# Patient Record
Sex: Female | Born: 1985 | Race: White | Hispanic: No | Marital: Single | Smoking: Current every day smoker
Health system: Southern US, Community
[De-identification: ages and names within clinical notes are randomized; demographics above are authoritative.]

## PROBLEM LIST (undated history)

## (undated) ENCOUNTER — Inpatient Hospital Stay (HOSPITAL_COMMUNITY): Payer: Self-pay

## (undated) DIAGNOSIS — D649 Anemia, unspecified: Secondary | ICD-10-CM

## (undated) DIAGNOSIS — B009 Herpesviral infection, unspecified: Secondary | ICD-10-CM

## (undated) HISTORY — PX: NO PAST SURGERIES: SHX2092

---
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
## 2010-11-05 ENCOUNTER — Other Ambulatory Visit: Payer: Self-pay | Admitting: Family Medicine

## 2010-11-05 DIAGNOSIS — R609 Edema, unspecified: Secondary | ICD-10-CM

## 2010-11-05 DIAGNOSIS — M79606 Pain in leg, unspecified: Secondary | ICD-10-CM

## 2010-11-10 ENCOUNTER — Ambulatory Visit
Admission: RE | Admit: 2010-11-10 | Discharge: 2010-11-10 | Disposition: A | Discharge status: Home / Self Care | Payer: Medicaid Other | Source: Ambulatory Visit | Attending: Family Medicine | Admitting: Family Medicine

## 2010-11-10 DIAGNOSIS — M79606 Pain in leg, unspecified: Secondary | ICD-10-CM

## 2010-11-10 DIAGNOSIS — R609 Edema, unspecified: Secondary | ICD-10-CM

## 2015-07-20 LAB — OB RESULTS CONSOLE GC/CHLAMYDIA
Chlamydia: NEGATIVE
Gonorrhea: NEGATIVE

## 2015-07-20 LAB — OB RESULTS CONSOLE GBS: GBS: NEGATIVE

## 2015-07-20 LAB — OB RESULTS CONSOLE RPR: RPR: NONREACTIVE

## 2015-07-20 LAB — OB RESULTS CONSOLE HIV ANTIBODY (ROUTINE TESTING): HIV: NONREACTIVE

## 2015-07-20 LAB — OB RESULTS CONSOLE RUBELLA ANTIBODY, IGM: RUBELLA: IMMUNE

## 2015-07-20 LAB — OB RESULTS CONSOLE HEPATITIS B SURFACE ANTIGEN: HEP B S AG: NEGATIVE

## 2015-07-20 LAB — OB RESULTS CONSOLE ANTIBODY SCREEN: ANTIBODY SCREEN: NEGATIVE

## 2015-09-06 NOTE — L&D Delivery Note (Signed)
Delivery Note At 4:38 AM a viable female "Lindsey Prince" was delivered via Vaginal, Spontaneous Delivery (Presentation: Occiput Anterior). APGARS: ; weight  .   Placenta status: Intact, Spontaneous Duncan. Cord: 3 vessels with the following complications: None.  Cord pH: NA  Anesthesia: Epidural  Episiotomy: None Lacerations: 1st degree (Inner labias & vaginal --- L inner labia hemostatic and required no repair. L inner labia and vagina repaired w/ 1 interrupted each) Suture Repair: 3.0 vicryl CT-1 Est. Blood Loss (mL): 100  Mom to postpartum.  Baby to Couplet care / Skin to Skin.  Mom undecided about birth control.   Lindsey Prince, Lindsey Prince 02/21/2016, 5:13 AM

## 2015-10-09 DIAGNOSIS — O4400 Placenta previa specified as without hemorrhage, unspecified trimester: Secondary | ICD-10-CM | POA: Insufficient documentation

## 2015-12-28 ENCOUNTER — Inpatient Hospital Stay (HOSPITAL_COMMUNITY)
Admission: AD | Admit: 2015-12-28 | Discharge: 2015-12-28 | Disposition: A | Discharge status: Home / Self Care | Payer: Medicaid Other | Source: Ambulatory Visit | Attending: Obstetrics and Gynecology | Admitting: Obstetrics and Gynecology

## 2015-12-28 ENCOUNTER — Encounter (HOSPITAL_COMMUNITY): Payer: Self-pay | Admitting: *Deleted

## 2015-12-28 DIAGNOSIS — Z3A31 31 weeks gestation of pregnancy: Secondary | ICD-10-CM | POA: Diagnosis not present

## 2015-12-28 DIAGNOSIS — IMO0001 Reserved for inherently not codable concepts without codable children: Secondary | ICD-10-CM

## 2015-12-28 DIAGNOSIS — O4703 False labor before 37 completed weeks of gestation, third trimester: Secondary | ICD-10-CM

## 2015-12-28 HISTORY — DX: Anemia, unspecified: D64.9

## 2015-12-28 LAB — WET PREP, GENITAL
Clue Cells Wet Prep HPF POC: NONE SEEN
Sperm: NONE SEEN
Trich, Wet Prep: NONE SEEN
WBC, Wet Prep HPF POC: NONE SEEN
Yeast Wet Prep HPF POC: NONE SEEN

## 2015-12-28 LAB — URINE MICROSCOPIC-ADD ON: Bacteria, UA: NONE SEEN

## 2015-12-28 LAB — URINALYSIS, ROUTINE W REFLEX MICROSCOPIC
Bilirubin Urine: NEGATIVE
Glucose, UA: NEGATIVE mg/dL
Ketones, ur: NEGATIVE mg/dL
Leukocytes, UA: NEGATIVE
Nitrite: NEGATIVE
Protein, ur: NEGATIVE mg/dL
Specific Gravity, Urine: 1.02 (ref 1.005–1.030)
pH: 6 (ref 5.0–8.0)

## 2015-12-28 LAB — FETAL FIBRONECTIN: Fetal Fibronectin: NEGATIVE

## 2015-12-28 MED ORDER — NIFEDIPINE 10 MG PO CAPS: 10.0000 mg | ORAL_CAPSULE | Freq: Four times a day (QID) | ORAL | Status: DC | PRN

## 2015-12-28 MED ORDER — LACTATED RINGERS IV BOLUS (SEPSIS)
1000.0000 mL | Freq: Once | INTRAVENOUS | Status: AC
  Administered 2015-12-28: 1000 mL via INTRAVENOUS

## 2015-12-28 MED ORDER — NIFEDIPINE 10 MG PO CAPS
10.0000 mg | ORAL_CAPSULE | ORAL | Status: DC | PRN
  Administered 2015-12-28 (×2): 10 mg via ORAL
  Filled 2015-12-28 (×2): qty 1

## 2015-12-28 MED ORDER — BUTORPHANOL TARTRATE 1 MG/ML IJ SOLN
2.0000 mg | Freq: Once | INTRAMUSCULAR | Status: AC
  Administered 2015-12-28: 2 mg via INTRAVENOUS
  Filled 2015-12-28: qty 2

## 2015-12-28 NOTE — MAU Note (Addendum)
Pt states had braxton hicks contractions around 1000.  Then sharp pain shifted to her lower left back and now her lower left abdomen.  Denies vaginal bleeding or LOF.Pt states also having pelvic pressure.

## 2015-12-28 NOTE — MAU Provider Note (Signed)
Lindsey Prince is a 30y.o. G2P1001 at 31.1wks who presented earlier for contractions.  Per provider, patient ctx started this afternoon and radiates into her back.  Also reports DFM.  Addendum (2022) S:  Patient reports continued contractions despite IV fluids and stadol.   O:  Filed Vitals:   12/28/15 1825 12/28/15 1946  BP: 125/77 119/72  Pulse: 76 73  Temp: 97.5 F (36.4 C)   Resp: 20 18   Results for orders placed or performed during the hospital encounter of 12/28/15 (from the past 24 hour(s))  Urinalysis, Routine w reflex microscopic ([facility])     Status: Abnormal   Collection Time: 12/28/15  6:10 PM  Result Value Ref Range   Color, Urine YELLOW YELLOW   APPearance CLEAR CLEAR   Specific Gravity, Urine 1.020 1.005 - 1.030   pH 6.0 5.0 - 8.0   Glucose, UA NEGATIVE NEGATIVE mg/dL   Hgb urine dipstick MODERATE (A) NEGATIVE   Bilirubin Urine NEGATIVE NEGATIVE   Ketones, ur NEGATIVE NEGATIVE mg/dL   Protein, ur NEGATIVE NEGATIVE mg/dL   Nitrite NEGATIVE NEGATIVE   Leukocytes, UA NEGATIVE NEGATIVE  Urine microscopic-add on     Status: Abnormal   Collection Time: 12/28/15  6:10 PM  Result Value Ref Range   Squamous Epithelial / LPF 6-30 (A) NONE SEEN   WBC, UA 0-5 0 - 5 WBC/hpf   RBC / HPF 6-30 0 - 5 RBC/hpf   Bacteria, UA NONE SEEN NONE SEEN   Urine-Other AMORPHOUS URATES/PHOSPHATES   Fetal fibronectin     Status: None   Collection Time: 12/28/15  6:33 PM  Result Value Ref Range   Fetal Fibronectin NEGATIVE NEGATIVE   VE: Cl/TH/Ballotable, Firm, Posterior  A: IUP at 31.1wks Cat I FT Contractions  P: Wet Prep obtained Discussed procardia regime per [facility] protocol Give first dose now and reassess  Addendum (2134) -Patient reports decrease in contractions -Incident of projectile vomiting, but no current nausea or feelings of ill -Discussed procardia usage  -Rx for procardia sent to pharmacy: 10mg  Q6hrs prn, Disp 30, RF 0 -Keep appt as scheduled: May  2 -Encouraged to call if any questions or concerns arise prior to next scheduled office visit.  -Discharged to home in improved condition  Lindsey RobinsJessica L Leroy Pettway MSN, CNM 12/28/2015 9:42 PM

## 2015-12-28 NOTE — MAU Provider Note (Signed)
Lindsey Prince is a 30 y.o. G2P1001 at 31.1 weeks presented to MAU unannounced c/o ctx that started this afternoon that moved to her left flank. She denies vb or lof.  She report a decrease in fetal  Movement when the pain moved to left flank   History     There are no active problems to display for this patient.   Chief Complaint  Patient presents with  . Abdominal Pain   HPI  OB History    Gravida Para Term Preterm AB TAB SAB Ectopic Multiple Living   2 1 1       1       Past Medical History  Diagnosis Date  . Anemia     Past Surgical History  Procedure Laterality Date  . No past surgeries      History reviewed. No pertinent family history.  Social History  Substance Use Topics  . Smoking status: Former Smoker    Types: Cigarettes    Quit date: 12/28/2014  . Smokeless tobacco: Never Used  . Alcohol Use: No    Allergies: Not on File  No prescriptions prior to admission    ROS See HPI above, all other systems are negative  Physical Exam   Blood pressure 125/77, pulse 76, temperature 97.5 F (36.4 C), temperature source Oral, resp. rate 20, last menstrual period 05/24/2015.  Physical Exam Ext:  WNL ABD: Soft, non tender to palpation, no rebound or guarding SVE: C/T/H   ED Course  Assessment: IUP at  31.1weeks Membranes: intact FHR: Category 1 CTX:  3 minutes   Plan: Labs: FFN 2 hr observation IVF stadol   Zelpha Messing, CNM, MSN 12/28/2015. 6:35 PM

## 2015-12-28 NOTE — Discharge Instructions (Signed)
Braxton Hicks Contractions °Contractions of the uterus can occur throughout pregnancy. Contractions are not always a sign that you are in labor.  °WHAT ARE BRAXTON HICKS CONTRACTIONS?  °Contractions that occur before labor are called Braxton Hicks contractions, or false labor. Toward the end of pregnancy (32-34 weeks), these contractions can develop more often and may become more forceful. This is not true labor because these contractions do not result in opening (dilatation) and thinning of the cervix. They are sometimes difficult to tell apart from true labor because these contractions can be forceful and people have different pain tolerances. You should not feel embarrassed if you go to the hospital with false labor. Sometimes, the only way to tell if you are in true labor is for your health care provider to look for changes in the cervix. °If there are no prenatal problems or other health problems associated with the pregnancy, it is completely safe to be sent home with false labor and await the onset of true labor. °HOW CAN YOU TELL THE DIFFERENCE BETWEEN TRUE AND FALSE LABOR? °False Labor °· The contractions of false labor are usually shorter and not as hard as those of true labor.   °· The contractions are usually irregular.   °· The contractions are often felt in the front of the lower abdomen and in the groin.   °· The contractions may go away when you walk around or change positions while lying down.   °· The contractions get weaker and are shorter lasting as time goes on.   °· The contractions do not usually become progressively stronger, regular, and closer together as with true labor.   °True Labor °· Contractions in true labor last 30-70 seconds, become very regular, usually become more intense, and increase in frequency.   °· The contractions do not go away with walking.   °· The discomfort is usually felt in the top of the uterus and spreads to the lower abdomen and low back.   °· True labor can be  determined by your health care provider with an exam. This will show that the cervix is dilating and getting thinner.   °WHAT TO REMEMBER °· Keep up with your usual exercises and follow other instructions given by your health care provider.   °· Take medicines as directed by your health care provider.   °· Keep your regular prenatal appointments.   °· Eat and drink lightly if you think you are going into labor.   °· If Braxton Hicks contractions are making you uncomfortable:   °¨ Change your position from lying down or resting to walking, or from walking to resting.   °¨ Sit and rest in a tub of warm water.   °¨ Drink 2-3 glasses of water. Dehydration may cause these contractions.   °¨ Do slow and deep breathing several times an hour.   °WHEN SHOULD I SEEK IMMEDIATE MEDICAL CARE? °Seek immediate medical care if: °· Your contractions become stronger, more regular, and closer together.   °· You have fluid leaking or gushing from your vagina.   °· You have a fever.   °· You pass blood-tinged mucus.   °· You have vaginal bleeding.   °· You have continuous abdominal pain.   °· You have low back pain that you never had before.   °· You feel your baby's head pushing down and causing pelvic pressure.   °· Your baby is not moving as much as it used to.   °  °This information is not intended to replace advice given to you by your health care provider. Make sure you discuss any questions you have with your health care   provider. °  °Document Released: 08/22/2005 Document Revised: 08/27/2013 Document Reviewed: 06/03/2013 °Elsevier Interactive Patient Education ©2016 [employer] ° °

## 2015-12-30 LAB — CULTURE, OB URINE: SPECIAL REQUESTS: NORMAL

## 2016-02-20 ENCOUNTER — Inpatient Hospital Stay (HOSPITAL_COMMUNITY)
Admit: 2016-02-20 | Discharge: 2016-02-22 | DRG: 774 | Disposition: A | Discharge status: Home / Self Care | Payer: Medicaid Other | Source: Ambulatory Visit | Attending: Obstetrics & Gynecology | Admitting: Obstetrics & Gynecology

## 2016-02-20 ENCOUNTER — Encounter (HOSPITAL_COMMUNITY): Payer: Self-pay | Admitting: *Deleted

## 2016-02-20 DIAGNOSIS — O4202 Full-term premature rupture of membranes, onset of labor within 24 hours of rupture: Principal | ICD-10-CM | POA: Diagnosis present

## 2016-02-20 DIAGNOSIS — F17210 Nicotine dependence, cigarettes, uncomplicated: Secondary | ICD-10-CM | POA: Diagnosis present

## 2016-02-20 DIAGNOSIS — Z6791 Unspecified blood type, Rh negative: Secondary | ICD-10-CM

## 2016-02-20 DIAGNOSIS — O99334 Smoking (tobacco) complicating childbirth: Secondary | ICD-10-CM | POA: Diagnosis present

## 2016-02-20 DIAGNOSIS — Z3490 Encounter for supervision of normal pregnancy, unspecified, unspecified trimester: Secondary | ICD-10-CM

## 2016-02-20 DIAGNOSIS — O26893 Other specified pregnancy related conditions, third trimester: Secondary | ICD-10-CM | POA: Diagnosis present

## 2016-02-20 DIAGNOSIS — A6000 Herpesviral infection of urogenital system, unspecified: Secondary | ICD-10-CM | POA: Diagnosis present

## 2016-02-20 DIAGNOSIS — Z3A38 38 weeks gestation of pregnancy: Secondary | ICD-10-CM | POA: Diagnosis not present

## 2016-02-20 DIAGNOSIS — O9832 Other infections with a predominantly sexual mode of transmission complicating childbirth: Secondary | ICD-10-CM | POA: Diagnosis present

## 2016-02-20 HISTORY — DX: Herpesviral infection, unspecified: B00.9

## 2016-02-20 LAB — CBC
HEMATOCRIT: 31.5 % — AB (ref 36.0–46.0)
HEMOGLOBIN: 11.3 g/dL — AB (ref 12.0–15.0)
MCH: 33.6 pg (ref 26.0–34.0)
MCHC: 35.9 g/dL (ref 30.0–36.0)
MCV: 93.8 fL (ref 78.0–100.0)
Platelets: 410 10*3/uL — ABNORMAL HIGH (ref 150–400)
RBC: 3.36 MIL/uL — AB (ref 3.87–5.11)
RDW: 13.3 % (ref 11.5–15.5)
WBC: 15.9 10*3/uL — ABNORMAL HIGH (ref 4.0–10.5)

## 2016-02-20 LAB — POCT FERN TEST: POCT Fern Test: POSITIVE

## 2016-02-20 MED ORDER — LIDOCAINE HCL (PF) 1 % IJ SOLN
30.0000 mL | INTRAMUSCULAR | Status: DC | PRN
  Filled 2016-02-20: qty 30

## 2016-02-20 MED ORDER — OXYTOCIN 40 UNITS IN LACTATED RINGERS INFUSION - SIMPLE MED: 2.5000 [IU]/h | INTRAVENOUS | Status: DC

## 2016-02-20 MED ORDER — OXYTOCIN 40 UNITS IN LACTATED RINGERS INFUSION - SIMPLE MED
1.0000 m[IU]/min | INTRAVENOUS | Status: DC
  Administered 2016-02-20: 2 m[IU]/min via INTRAVENOUS
  Filled 2016-02-20: qty 1000

## 2016-02-20 MED ORDER — OXYCODONE-ACETAMINOPHEN 5-325 MG PO TABS: 2.0000 | ORAL_TABLET | ORAL | Status: DC | PRN

## 2016-02-20 MED ORDER — SOD CITRATE-CITRIC ACID 500-334 MG/5ML PO SOLN: 30.0000 mL | ORAL | Status: DC | PRN

## 2016-02-20 MED ORDER — TERBUTALINE SULFATE 1 MG/ML IJ SOLN
0.2500 mg | Freq: Once | INTRAMUSCULAR | Status: DC | PRN
  Filled 2016-02-20: qty 1

## 2016-02-20 MED ORDER — OXYCODONE-ACETAMINOPHEN 5-325 MG PO TABS: 1.0000 | ORAL_TABLET | ORAL | Status: DC | PRN

## 2016-02-20 MED ORDER — LACTATED RINGERS IV SOLN: 500.0000 mL | INTRAVENOUS | Status: DC | PRN

## 2016-02-20 MED ORDER — BUTORPHANOL TARTRATE 1 MG/ML IJ SOLN
1.0000 mg | INTRAMUSCULAR | Status: DC | PRN
  Administered 2016-02-20 (×2): 1 mg via INTRAVENOUS
  Filled 2016-02-20 (×2): qty 1

## 2016-02-20 MED ORDER — LACTATED RINGERS IV SOLN
INTRAVENOUS | Status: DC
  Administered 2016-02-20 – 2016-02-21 (×3): via INTRAVENOUS

## 2016-02-20 MED ORDER — OXYTOCIN 40 UNITS IN LACTATED RINGERS INFUSION - SIMPLE MED: 1.0000 m[IU]/min | INTRAVENOUS | Status: DC

## 2016-02-20 MED ORDER — ACETAMINOPHEN 325 MG PO TABS: 650.0000 mg | ORAL_TABLET | ORAL | Status: DC | PRN

## 2016-02-20 MED ORDER — ONDANSETRON HCL 4 MG/2ML IJ SOLN: 4.0000 mg | Freq: Four times a day (QID) | INTRAMUSCULAR | Status: DC | PRN

## 2016-02-20 MED ORDER — OXYTOCIN BOLUS FROM INFUSION
500.0000 mL | INTRAVENOUS | Status: DC
  Administered 2016-02-21: 500 mL via INTRAVENOUS

## 2016-02-20 NOTE — MAU Note (Signed)
Water broke around 5am,  Contractions started shortly there after

## 2016-02-20 NOTE — Anesthesia Pain Management Evaluation Note (Signed)
  CRNA Pain Management Visit Note  Patient: Lindsey Prince, 30 y.o., female  "Hello I am a [facility]. We have an anesthesia team available at all times to provide care throughout the hospital, including epidural management and anesthesia for C-section. I don't know your plan for the delivery whether it a natural birth, water birth, IV sedation, nitrous supplementation, doula or epidural, but we want to meet your pain goals."   1.Was your pain managed to your expectations on prior hospitalizations?   yes  2.What is your expectation for pain management during this hospitalization?     Natural possibly IVP meds  3.How can we help you reach that goal? No epidural   Record the patient's initial score and the patient's pain goal.   Pain:0  Pain Goal: 7  [facility] wants you to be able to say your pain was always managed very well.  [facility] Hospital[facility],Shriya Aker 02/20/2016

## 2016-02-20 NOTE — Progress Notes (Addendum)
[facility], 30 yo G2P1001 @ 38.6 wks admitted for PROM. Family at bedside.  Subjective: Ctxs becoming more uncomfortable. Continues to leak clear fluid. +FM.  Objective: BP 120/79 mmHg  Pulse 72  Temp(Src) 98 F (36.7 C) (Oral)  Resp 18  Ht 5\' 2"  (1.575 m)  Wt 71.215 kg (157 lb)  BMI 28.71 kg/m2  LMP 05/24/2015 I/O last 3 completed shifts: In: 120 [P.O.:120] Out: -     FHT: BL 140 w/ moderate variability, +accels, earlys, no decels UC:   irregular, every 1-2 minutes SVE:   Dilation: 1 Effacement (%): 60 Station: -3 Exam by:: Lindsey Prince, CNM@ 2229 Pitocin at 8 mU/min IUPC placed w/o difficulty @ 2229  Assessment:  IUP at term ROM x 17 hrs; no s/s of infection GBS neg  Plan: Continue Pitocin IV pain med/epidural prn  Sherre ScarletWILLIAMS, Julann Mcgilvray CNM 02/20/2016, 10:36 PM

## 2016-02-20 NOTE — MAU Note (Addendum)
Water broke at 0530, clear. Having some contractions. Decreased FM this AM.

## 2016-02-20 NOTE — H&P (Signed)
Lindsey Prince is a 30 y.o. female, G2P1001 at 1847w6d weeks, presenting for rupture of membranes around 530 this am.  Having some contractions, but states they are not very strong.  No vaginal bleeding, +FM.  Prenatal care with [facility].  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 Anemia    Past Surgical History  Procedure Laterality Date  . No past surgeries     Family History: family history is not on file. Social History:  reports that she has been smoking Cigarettes.  She has been smoking about 0.25 packs per day. She has never used smokeless tobacco. She reports that she does not drink alcohol or use illicit drugs.   Prenatal Transfer Tool  Maternal Diabetes: No Genetic Screening: Normal Maternal Ultrasounds/Referrals: Normal Fetal Ultrasounds or other Referrals:  None Maternal Substance Abuse:  No Significant Maternal Medications:  None Significant Maternal Lab Results: Lab values include: Group B Strep negative, Rh negative  ROS:  No headache, no blurry vision, no RUQ pain  No Known Allergies   Dilation: 1 Effacement (%): 60 Station: -3 Exam by:: S. Carrera, RNC Blood pressure 108/57, pulse 74, temperature 98.2 F (36.8 C), temperature source Oral, resp. rate 16, height 5\' 2"  (1.575 m), weight 71.396 kg (157 lb 6.4 oz), last menstrual period 05/24/2015.  Chest clear Heart RRR without murmur Abd gravid, NT Pelvic: normal external genitalia, no lesions on speculum exam, no cervical lesions Ext: 1+ pitting edema R>L, no calf tenderness bilaterally  FHR: Category I UCs:  irregular    PRENATAL LABS: A negative, antibody neg, Hep B neg, Rub immune, RPR NR, HIV negative    Results for orders placed or  performed during the hospital encounter of 02/20/16 (from the past 24 hour(s))  Fern Test     Status: None   Collection Time: 02/20/16 11:42 AM  Result Value Ref Range   POCT Fern Test Positive = ruptured amniotic membanes   CBC     Status: Abnormal   Collection Time: 02/20/16 12:12 PM  Result Value Ref Range   WBC 15.9 (H) 4.0 - 10.5 K/uL   RBC 3.36 (L) 3.87 - 5.11 MIL/uL   Hemoglobin 11.3 (L) 12.0 - 15.0 g/dL   HCT 52.831.5 (L) 41.336.0 - 24.446.0 %   MCV 93.8 78.0 - 100.0 fL   MCH 33.6 26.0 - 34.0 pg   MCHC 35.9 30.0 - 36.0 g/dL   RDW 01.013.3 27.211.5 - 53.615.5 %   Platelets 410 (H) 150 - 400 K/uL   A/P: 30yo G2P1001 @ 7147w6d who presents for PROM -FWB- Cat. I -Labor- plan for Pit per protocol -Pain management: IV medication prn -GBS negative  Myna HidalgoJennifer Dontavian Marchi, DO [phone] (pager) [phone] (office)

## 2016-02-21 ENCOUNTER — Inpatient Hospital Stay (HOSPITAL_COMMUNITY): Payer: Medicaid Other | Admitting: Anesthesiology

## 2016-02-21 ENCOUNTER — Encounter (HOSPITAL_COMMUNITY): Payer: Self-pay | Admitting: Anesthesiology

## 2016-02-21 MED ORDER — METHYLERGONOVINE MALEATE 0.2 MG/ML IJ SOLN: 0.2000 mg | INTRAMUSCULAR | Status: DC | PRN

## 2016-02-21 MED ORDER — LACTATED RINGERS IV SOLN: 500.0000 mL | Freq: Once | INTRAVENOUS | Status: DC

## 2016-02-21 MED ORDER — IBUPROFEN 600 MG PO TABS
600.0000 mg | ORAL_TABLET | Freq: Four times a day (QID) | ORAL | Status: DC
  Administered 2016-02-21 – 2016-02-22 (×6): 600 mg via ORAL
  Filled 2016-02-21 (×6): qty 1

## 2016-02-21 MED ORDER — DIBUCAINE 1 % RE OINT: 1.0000 "application " | TOPICAL_OINTMENT | RECTAL | Status: DC | PRN

## 2016-02-21 MED ORDER — LIDOCAINE HCL (PF) 1 % IJ SOLN
INTRAMUSCULAR | Status: DC | PRN
  Administered 2016-02-21 (×2): 6 mL

## 2016-02-21 MED ORDER — SIMETHICONE 80 MG PO CHEW: 80.0000 mg | CHEWABLE_TABLET | ORAL | Status: DC | PRN

## 2016-02-21 MED ORDER — FERROUS SULFATE 325 (65 FE) MG PO TABS
325.0000 mg | ORAL_TABLET | Freq: Two times a day (BID) | ORAL | Status: DC
  Administered 2016-02-21 – 2016-02-22 (×3): 325 mg via ORAL
  Filled 2016-02-21 (×3): qty 1

## 2016-02-21 MED ORDER — EPHEDRINE 5 MG/ML INJ
10.0000 mg | INTRAVENOUS | Status: DC | PRN
  Filled 2016-02-21: qty 2

## 2016-02-21 MED ORDER — COCONUT OIL OIL: 1.0000 "application " | TOPICAL_OIL | Status: DC | PRN

## 2016-02-21 MED ORDER — ACETAMINOPHEN 325 MG PO TABS: 650.0000 mg | ORAL_TABLET | ORAL | Status: DC | PRN

## 2016-02-21 MED ORDER — DIPHENHYDRAMINE HCL 25 MG PO CAPS: 25.0000 mg | ORAL_CAPSULE | Freq: Four times a day (QID) | ORAL | Status: DC | PRN

## 2016-02-21 MED ORDER — SENNOSIDES-DOCUSATE SODIUM 8.6-50 MG PO TABS
2.0000 | ORAL_TABLET | ORAL | Status: DC
  Administered 2016-02-22: 2 via ORAL
  Filled 2016-02-21: qty 2

## 2016-02-21 MED ORDER — LACTATED RINGERS IV SOLN
500.0000 mL | Freq: Once | INTRAVENOUS | Status: AC
  Administered 2016-02-21: 500 mL via INTRAVENOUS

## 2016-02-21 MED ORDER — FENTANYL 2.5 MCG/ML BUPIVACAINE 1/10 % EPIDURAL INFUSION (WH - ANES)
14.0000 mL/h | INTRAMUSCULAR | Status: DC | PRN
  Administered 2016-02-21: 14 mL/h via EPIDURAL
  Filled 2016-02-21: qty 125

## 2016-02-21 MED ORDER — ONDANSETRON HCL 4 MG PO TABS: 4.0000 mg | ORAL_TABLET | ORAL | Status: DC | PRN

## 2016-02-21 MED ORDER — METHYLERGONOVINE MALEATE 0.2 MG PO TABS: 0.2000 mg | ORAL_TABLET | ORAL | Status: DC | PRN

## 2016-02-21 MED ORDER — PHENYLEPHRINE 40 MCG/ML (10ML) SYRINGE FOR IV PUSH (FOR BLOOD PRESSURE SUPPORT)
80.0000 ug | PREFILLED_SYRINGE | INTRAVENOUS | Status: DC | PRN
  Filled 2016-02-21: qty 5

## 2016-02-21 MED ORDER — OXYCODONE-ACETAMINOPHEN 5-325 MG PO TABS
1.0000 | ORAL_TABLET | ORAL | Status: DC | PRN
  Administered 2016-02-22: 1 via ORAL
  Filled 2016-02-21: qty 1

## 2016-02-21 MED ORDER — TETANUS-DIPHTH-ACELL PERTUSSIS 5-2.5-18.5 LF-MCG/0.5 IM SUSP
0.5000 mL | Freq: Once | INTRAMUSCULAR | Status: DC
Start: 2016-02-22 — End: 2016-02-22

## 2016-02-21 MED ORDER — PRENATAL MULTIVITAMIN CH
1.0000 | ORAL_TABLET | Freq: Every day | ORAL | Status: DC
  Administered 2016-02-21 – 2016-02-22 (×2): 1 via ORAL
  Filled 2016-02-21 (×2): qty 1

## 2016-02-21 MED ORDER — ONDANSETRON HCL 4 MG/2ML IJ SOLN
4.0000 mg | INTRAMUSCULAR | Status: DC | PRN
Start: 2016-02-21 — End: 2016-02-22

## 2016-02-21 MED ORDER — PHENYLEPHRINE 40 MCG/ML (10ML) SYRINGE FOR IV PUSH (FOR BLOOD PRESSURE SUPPORT)
80.0000 ug | PREFILLED_SYRINGE | INTRAVENOUS | Status: DC | PRN
  Filled 2016-02-21: qty 10
  Filled 2016-02-21: qty 5

## 2016-02-21 MED ORDER — WITCH HAZEL-GLYCERIN EX PADS: 1.0000 "application " | MEDICATED_PAD | CUTANEOUS | Status: DC | PRN

## 2016-02-21 MED ORDER — DIPHENHYDRAMINE HCL 50 MG/ML IJ SOLN: 12.5000 mg | INTRAMUSCULAR | Status: DC | PRN

## 2016-02-21 MED ORDER — OXYCODONE-ACETAMINOPHEN 5-325 MG PO TABS: 2.0000 | ORAL_TABLET | ORAL | Status: DC | PRN

## 2016-02-21 MED ORDER — BENZOCAINE-MENTHOL 20-0.5 % EX AERO
1.0000 "application " | INHALATION_SPRAY | CUTANEOUS | Status: DC | PRN
  Administered 2016-02-21: 1 via TOPICAL
  Filled 2016-02-21: qty 56

## 2016-02-21 MED ORDER — ZOLPIDEM TARTRATE 5 MG PO TABS: 5.0000 mg | ORAL_TABLET | Freq: Every evening | ORAL | Status: DC | PRN

## 2016-02-21 NOTE — Progress Notes (Signed)
Mom moved from L&D resting.

## 2016-02-21 NOTE — Lactation Note (Signed)
This note was copied from a baby's chart. Lactation Consultation Note  Patient Name: Lindsey Prince ZOXWR'UToday's Date: 02/21/2016 Reason for consult: Follow-up assessment [facility], reporting 1st time she latched by herself, Mom was very proud.  Baby demonstrating some good suckling bursts at breast. LC worked with Mom helping her use breast compression for more depth with latch. Basic teaching reviewed, encouraged to continue to BF with feeding ques, cluster feeding discussed. Lactation brochure left for review, advised of OP services and support group. Encouraged to call for assist as needed.   Maternal Data Has patient been taught Hand Expression?: Yes Does the patient have breastfeeding experience prior to this delivery?: No  Feeding Feeding Type: Breast Fed Length of feed: 10 min  LATCH Score/Interventions Latch: Grasps breast easily, tongue down, lips flanged, rhythmical sucking. Intervention(s): Skin to skin  Audible Swallowing: A few with stimulation Intervention(s): Skin to skin Intervention(s): Skin to skin  Type of Nipple: Everted at rest and after stimulation  Comfort (Breast/Nipple): Soft / non-tender     Hold (Positioning): Assistance needed to correctly position infant at breast and maintain latch. Intervention(s): Breastfeeding basics reviewed;Position options;Support Pillows;Skin to skin  LATCH Score: 8  Lactation Tools Discussed/Used     Consult Status Consult Status: Follow-up Date: 02/22/16 Follow-up type: In-patient    Alfred LevinsGranger, Lindsey Prince 02/21/2016, 9:06 PM

## 2016-02-21 NOTE — Anesthesia Postprocedure Evaluation (Signed)
Anesthesia Post Note  Patient: Lindsey Prince  Procedure(s) Performed: * No procedures listed *  Patient location during evaluation: Mother Baby Anesthesia Type: Epidural Level of consciousness: awake Pain management: satisfactory to patient Vital Signs Assessment: post-procedure vital signs reviewed and stable Respiratory status: spontaneous breathing Cardiovascular status: stable Anesthetic complications: no     Last Vitals:  Filed Vitals:   02/21/16 0700 02/21/16 0802  BP: 113/69 107/55  Pulse: 79 66  Temp: 36.7 C 36.6 C  Resp: 20 18    Last Pain:  Filed Vitals:   02/21/16 1255  PainSc: 3    Pain Goal: Patients Stated Pain Goal: 3 (02/21/16 1254)               Cephus ShellingBURGER,Tyianna Menefee

## 2016-02-21 NOTE — Anesthesia Procedure Notes (Signed)
Epidural Patient location during procedure: OB  Staffing Anesthesiologist: Melitza Metheny  Preanesthetic Checklist Completed: patient identified, site marked, surgical consent, pre-op evaluation, timeout performed, IV checked, risks and benefits discussed and monitors and equipment checked  Epidural Patient position: sitting Prep: DuraPrep Patient monitoring: heart rate and blood pressure Approach: midline Location: L4-L5 Injection technique: LOR saline  Needle:  Needle type: Tuohy  Needle gauge: 17 G Needle length: 9 cm Needle insertion depth: 5 cm Catheter type: closed end flexible Catheter size: 19 Gauge Catheter at skin depth: 12 cm Test dose: negative and Other  Assessment Events: blood not aspirated, injection not painful, no injection resistance, negative IV test and no paresthesia  Additional Notes Reason for block:procedure for pain   

## 2016-02-21 NOTE — Anesthesia Preprocedure Evaluation (Signed)
Anesthesia Evaluation  Patient identified by MRN, date of birth, ID band Patient awake    Reviewed: Allergy & Precautions, NPO status , Patient's Chart, lab work & pertinent test results  Airway Mallampati: II  TM Distance: >3 FB Neck ROM: Full    Dental no notable dental hx.    Pulmonary Current Smoker,    Pulmonary exam normal breath sounds clear to auscultation       Cardiovascular negative cardio ROS Normal cardiovascular exam Rhythm:Regular Rate:Normal     Neuro/Psych negative neurological ROS  negative psych ROS   GI/Hepatic negative GI ROS, Neg liver ROS,   Endo/Other  negative endocrine ROS  Renal/GU negative Renal ROS  negative genitourinary   Musculoskeletal negative musculoskeletal ROS (+)   Abdominal   Peds negative pediatric ROS (+)  Hematology  (+) anemia ,   Anesthesia Other Findings   Reproductive/Obstetrics negative OB ROS                             Anesthesia Physical Anesthesia Plan  ASA: II  Anesthesia Plan: Epidural   Post-op Pain Management:    Induction: Intravenous  Airway Management Planned: Natural Airway  Additional Equipment:   Intra-op Plan:   Post-operative Plan:   Informed Consent: I have reviewed the patients History and Physical, chart, labs and discussed the procedure including the risks, benefits and alternatives for the proposed anesthesia with the patient or authorized representative who has indicated his/her understanding and acceptance.   Dental advisory given  Plan Discussed with: CRNA  Anesthesia Plan Comments: (Informed consent obtained prior to proceeding including risk of failure, 1% risk of PDPH, risk of minor discomfort and bruising.  Discussed rare but serious complications including epidural abscess, permanent nerve injury, epidural hematoma.  Discussed alternatives to epidural analgesia and patient desires to proceed.   Timeout performed pre-procedure verifying patient name, procedure, and platelet count.  Patient tolerated procedure well. )        Anesthesia Quick Evaluation

## 2016-02-22 ENCOUNTER — Encounter (HOSPITAL_COMMUNITY): Payer: Self-pay | Admitting: *Deleted

## 2016-02-22 LAB — CBC
HCT: 29.1 % — ABNORMAL LOW (ref 36.0–46.0)
HEMOGLOBIN: 10 g/dL — AB (ref 12.0–15.0)
MCH: 32.5 pg (ref 26.0–34.0)
MCHC: 34.4 g/dL (ref 30.0–36.0)
MCV: 94.5 fL (ref 78.0–100.0)
PLATELETS: 330 10*3/uL (ref 150–400)
RBC: 3.08 MIL/uL — AB (ref 3.87–5.11)
RDW: 13.4 % (ref 11.5–15.5)
WBC: 14.4 10*3/uL — ABNORMAL HIGH (ref 4.0–10.5)

## 2016-02-22 MED ORDER — MEDROXYPROGESTERONE ACETATE 150 MG/ML IM SUSP
150.0000 mg | Freq: Once | INTRAMUSCULAR | Status: AC
  Administered 2016-02-22: 150 mg via INTRAMUSCULAR
  Filled 2016-02-22: qty 1

## 2016-02-22 MED ORDER — RHO D IMMUNE GLOBULIN 1500 UNIT/2ML IJ SOSY
300.0000 ug | PREFILLED_SYRINGE | Freq: Once | INTRAMUSCULAR | Status: AC
  Administered 2016-02-22: 300 ug via INTRAVENOUS
  Filled 2016-02-22: qty 2

## 2016-02-22 MED ORDER — IBUPROFEN 600 MG PO TABS: 600.0000 mg | ORAL_TABLET | Freq: Four times a day (QID) | ORAL | Status: DC | PRN

## 2016-02-22 NOTE — Progress Notes (Signed)
Mom places baby skin to skin every time she has a breastfeeding attempt or successful feed.

## 2016-02-22 NOTE — Progress Notes (Signed)
RN came in to find mom asleep in the bed with baby.  RN woke mom and reinforced safe sleep.  Mom verbalized understanding.

## 2016-02-22 NOTE — Discharge Instructions (Signed)

## 2016-02-22 NOTE — Progress Notes (Signed)
Assumed care of mom and baby.  Mom holding baby.  Dad at bedside.

## 2016-02-22 NOTE — Discharge Summary (Signed)
[facility] Discharge Summary   Patient Name:   Lindsey Prince DOB:     01/11/1986 MRN:     [id number]  Date of Admission:   02/20/2016 Date of Discharge:  02/22/2016  Admitting diagnosis:    38 WEEKS PLUS 6 DAYS  WATER BROKE Principal Problem:   Vaginal delivery Active Problems:   First degree perineal laceration during delivery  Term Pregnancy Delivered    Discharge diagnosis:    38 WEEKS PLUS 6 DAYS  WATER BROKE Principal Problem:   Vaginal delivery Active Problems:   First degree perineal laceration during delivery  Term Pregnancy Delivered                                                                     Post partum procedures: Depo  Type of Delivery:  SVB  Delivering Provider: Sherre Scarlet   Date of Delivery:  02/20/16  Newborn Data:    Live born female  Birth Weight: 6 lb 2.6 oz (2795 g) APGAR: 8, 9  Baby's Name:  Coralee Baby Feeding:   Breast Disposition:   home with mother  Complications:   None  Hospital course:      Onset of Labor With Vaginal Delivery     30 y.o. yo G2P2001 at [redacted]w[redacted]d was admitted in Active Labor on 02/20/2016. Patient had an uncomplicated labor course as follows:  Membrane Rupture Time/Date: 5:30 AM ,02/20/2016   Intrapartum Procedures: Episiotomy: None [1]                                         Lacerations:  1st degree [2]  Patient had a delivery of a Viable infant. 02/21/2016  Information for the patient's newborn:  Tawan, Corkern [[id number]]  Delivery Method: Vaginal, Spontaneous Delivery (Filed from Delivery Summary)    Pateint had an uncomplicated postpartum course.  She is ambulating, tolerating a regular diet, passing flatus, and urinating well. Patient is discharged home in stable condition on 02/22/2016.    Physical Exam:   Filed Vitals:   02/21/16 0700 02/21/16 0802 02/21/16 1833 02/22/16 0518  BP: 113/69 107/55 108/59 104/75  Pulse: 79 66 85 58  Temp: 98 F (36.7 C) 97.9 F (36.6 C)  98.4 F (36.9 C) 98.2 F (36.8 C)  TempSrc:  Axillary Oral Oral  Resp: Height:      Weight:      SpO2: 99% 100% 98%    General: alert Lochia: appropriate Uterine Fundus: firm Incision: 1st degree healing well DVT Evaluation: No evidence of DVT seen on physical exam. Negative Homan's sign.  Labs:  CBC Latest Ref Rng 02/22/2016 02/20/2016  WBC 4.0 - 10.5 K/uL 14.4(H) 15.9(H)  Hemoglobin 12.0 - 15.0 g/dL 10.0(L) 11.3(L)  Hematocrit 36.0 - 46.0 % 29.1(L) 31.5(L)  Platelets 150 - 400 K/uL 330 410(H)    No flowsheet data found.  Discharge instruction: per After Visit Summary and "Baby and Me Booklet".  After Visit Meds:    Medication List    STOP taking these medications        valACYclovir 500 MG tablet  Commonly known as:  VALTREX      TAKE these medications        ibuprofen 600 MG tablet  Commonly known as:  ADVIL,MOTRIN  Take 1 tablet (600 mg total) by mouth every 6 (six) hours as needed.     NIFEdipine 10 MG capsule  Commonly known as:  PROCARDIA  Take 1 capsule (10 mg total) by mouth every 6 (six) hours as needed (Contractions).     prenatal multivitamin Tabs tablet  Take 1 tablet by mouth daily at 12 noon.        Diet: routine diet  Activity: Advance as tolerated. Pelvic rest for 6 weeks.   Outpatient follow up:6 weeks Follow up Appt:No future appointments. Follow up visit: No Follow-up on file.  Postpartum contraception: Depo Provera  02/22/2016 Nigel BridgemanLATHAM, Teka Chanda, CNM

## 2016-02-23 LAB — RH IG WORKUP (INCLUDES ABO/RH)
ABO/RH(D): A NEG
Fetal Screen: NEGATIVE
GESTATIONAL AGE(WKS): 39
Unit division: 0

## 2016-02-24 LAB — TYPE AND SCREEN
ABO/RH(D): A NEG
Antibody Screen: POSITIVE
DAT, IGG: NEGATIVE
Unit division: 0
Unit division: 0

## 2016-02-26 LAB — RPR: RPR Ser Ql: NONREACTIVE

## 2016-02-26 LAB — HIV ANTIBODY (ROUTINE TESTING W REFLEX): HIV Screen 4th Generation wRfx: NONREACTIVE

## 2016-03-31 ENCOUNTER — Ambulatory Visit (HOSPITAL_COMMUNITY): Payer: Medicaid Other | Attending: Obstetrics and Gynecology

## 2018-03-29 ENCOUNTER — Encounter (HOSPITAL_COMMUNITY): Payer: Self-pay

## 2018-03-29 ENCOUNTER — Emergency Department (HOSPITAL_COMMUNITY)
Admission: EM | Admit: 2018-03-29 | Discharge: 2018-03-29 | Disposition: A | Discharge status: Home / Self Care | Payer: Self-pay | Attending: Emergency Medicine | Admitting: Emergency Medicine

## 2018-03-29 DIAGNOSIS — F17210 Nicotine dependence, cigarettes, uncomplicated: Secondary | ICD-10-CM | POA: Insufficient documentation

## 2018-03-29 DIAGNOSIS — M5412 Radiculopathy, cervical region: Secondary | ICD-10-CM | POA: Insufficient documentation

## 2018-03-29 MED ORDER — KETOROLAC TROMETHAMINE 60 MG/2ML IM SOLN
60.0000 mg | Freq: Once | INTRAMUSCULAR | Status: AC
  Administered 2018-03-29: 60 mg via INTRAMUSCULAR
  Filled 2018-03-29: qty 2

## 2018-03-29 MED ORDER — PREDNISONE 20 MG PO TABS: 40.0000 mg | ORAL_TABLET | Freq: Every day | ORAL | 0 refills | Status: DC

## 2018-03-29 MED ORDER — METHOCARBAMOL 500 MG PO TABS: 500.0000 mg | ORAL_TABLET | Freq: Two times a day (BID) | ORAL | 0 refills | Status: DC | PRN

## 2018-03-29 MED ORDER — DEXAMETHASONE 4 MG PO TABS
10.0000 mg | ORAL_TABLET | Freq: Once | ORAL | Status: AC
  Administered 2018-03-29: 10 mg via ORAL
  Filled 2018-03-29: qty 3

## 2018-03-29 NOTE — Discharge Instructions (Signed)
We recommend close follow up with sports medicine. You would likely benefit from a future MRI. Take prednisone as prescribed until finished. You may supplement this with robaxin twice a day for muscle spasms.   Good over the counter options for pain and symptomatic management are tumeric tablets and magnesium cream. The affects of tumeric tablets, taken daily, will taken 1-2 months to show gradual benefit, but work as a natural antiinflammatory to relieve pain. Magnesium cream can be massaged into your left upper back for episodic pain relief.   You may return to the ED for new or concerning symptoms.

## 2018-03-29 NOTE — ED Triage Notes (Signed)
Pt presents with L arm numbness x 2 months with pain to same arm x 1 year.  Pt denies any injury back then, reports intermittent pain in her neck without any injury.

## 2018-03-29 NOTE — ED Provider Notes (Signed)
[facility] Provider Note   CSN: [id number] Arrival date & time: 03/29/18  1752     History   Chief Complaint No chief complaint on file.   HPI Lindsey Prince is a 32 y.o. female.   32 year old female presents to the emergency department for evaluation of left arm pain and numbness.  She states that she initially noticed symptoms in 2012.  They improved after ongoing massage, but recurred in 2016 during a pregnancy.  After delivery of her child, she states that pain subsided again until recurrence 1 year later.  She has been experiencing more consistent pain for the past year which has worsened over the past 2 months.  Patient describes the pain as a burning sensation which radiates down her left arm.  This is associated with some paresthesias as well as decreased sensation in her left thumb.  She reports intermittent weakness in the hand such as when gripping things or picking up items.  She has tried over-the-counter anti-inflammatories for pain without relief.  She denies any associated fevers, weakness or numbness in the lower extremities, bowel or bladder incontinence, history of cancer, history of IV drug use.  No inciting trauma or injury known to the patient.  Reports poor OP follow-up secondary to financial stressors and lack of insurance.     Past Medical History:  Diagnosis Date  . Anemia   . Herpes     Patient Active Problem List   Diagnosis Date Noted  . Vaginal delivery 02/21/2016  . First degree perineal laceration during delivery 02/21/2016    Past Surgical History:  Procedure Laterality Date  . NO PAST SURGERIES       OB History    Gravida  2   Para  2   Term  2   Preterm      AB      Living  1     SAB      TAB      Ectopic      Multiple  0   Live Births  1            Home Medications    Prior to Admission medications   Medication Sig Start Date End Date Taking? Authorizing Provider    ibuprofen (ADVIL,MOTRIN) 600 MG tablet Take 1 tablet (600 mg total) by mouth every 6 (six) hours as needed. 02/22/16   Nigel BridgemanLatham, Vicki, CNM  methocarbamol (ROBAXIN) 500 MG tablet Take 1 tablet (500 mg total) by mouth 2 (two) times daily as needed for muscle spasms. 03/29/18   Antony MaduraHumes, Dalten Ambrosino, PA-C  NIFEdipine (PROCARDIA) 10 MG capsule Take 1 capsule (10 mg total) by mouth every 6 (six) hours as needed (Contractions). Patient not taking: Reported on 02/20/2016 12/28/15   Gerrit HeckEmly, Jessica, CNM  predniSONE (DELTASONE) 20 MG tablet Take 2 tablets (40 mg total) by mouth daily. Take 40 mg by mouth daily for 3 days, then 20mg  by mouth daily for 3 days, then 10mg  daily for 3 days 03/29/18   Antony MaduraHumes, Boston Cookson, PA-C  Prenatal Vit-Fe Fumarate-FA (PRENATAL MULTIVITAMIN) TABS tablet Take 1 tablet by mouth daily at 12 noon.    [provider]    Family History History reviewed. No pertinent family history.  Social History Social History   Tobacco Use  . Smoking status: Current Every Day Smoker    Packs/day: 0.25    Types: Cigarettes    Last attempt to quit: 12/28/2014    Years since quitting: 3.2  .  Smokeless tobacco: Never Used  Substance Use Topics  . Alcohol use: No  . Drug use: No     Allergies   Percocet [oxycodone-acetaminophen]   Review of Systems Review of Systems Ten systems reviewed and are negative for acute change, except as noted in the HPI.    Physical Exam Updated Vital Signs BP 101/72 (BP Location: Right Arm)   Pulse 69   Temp 98.2 F (36.8 C) (Oral)   Resp 14   Ht 5\' 2"  (1.575 m)   LMP 03/20/2018 (Approximate)   SpO2 100%   BMI 28.72 kg/m   Physical Exam  Constitutional: She is oriented to person, place, and time. She appears well-developed and well-nourished. No distress.  Nontoxic appearing and in no acute distress  HENT:  Head: Normocephalic and atraumatic.  Eyes: Conjunctivae and EOM are normal. No scleral icterus.  Neck: Normal range of motion.  No tenderness  to palpation to the cervical or upper thoracic midline.  No bony deformities, step-offs, crepitus.  Mild left trapezius tenderness without spasm.  Cardiovascular: Normal rate, regular rhythm and intact distal pulses.  Pulmonary/Chest: Effort normal. No stridor. No respiratory distress.  Respirations even and unlabored  Musculoskeletal: Normal range of motion.  Neurological: She is alert and oriented to person, place, and time. She exhibits normal muscle tone. Coordination normal.  Normal grip strength bilaterally with 5/5 strength against resistance in all major muscle groups of bilateral upper extremities.  Normal shoulder shrug against resistance.  Good fine grip strength; able to pick up items off a flat surface.  Skin: Skin is warm and dry. No rash noted. She is not diaphoretic. No erythema. No pallor.  Psychiatric: She has a normal mood and affect. Her behavior is normal.  Nursing note and vitals reviewed.    ED Treatments / Results  Labs (all labs ordered are listed, but only abnormal results are displayed) Labs Reviewed - No data to display  EKG None  Radiology No results found.  Procedures Procedures (including critical care time)  Medications Ordered in ED Medications  ketorolac (TORADOL) injection 60 mg (60 mg Intramuscular Given 03/29/18 2304)  dexamethasone (DECADRON) tablet 10 mg (10 mg Oral Given 03/29/18 2305)     Initial Impression / Assessment and Plan / ED Course  I have reviewed the triage vital signs and the nursing notes.  Pertinent labs & imaging results that were available during my care of the patient were reviewed by me and considered in my medical decision making (see chart for details).     32 year old female presents to the emergency department for symptoms consistent with cervical radiculopathy.  She has no strength or sensation deficits to me: Neurovascularly intact.  No history of fevers or IV drug use.  No concern for central cord involvement.   No red flags or signs concerning for cauda equina.  Given reassuring exam and chronicity of symptoms, will continue with outpatient follow-up.  Patient would likely benefit from future MRI.  Referral provided to sports medicine.  Will start on prednisone taper.  Robaxin given for muscle spasms.  Return precautions discussed and provided. Patient discharged in stable condition with no unaddressed concerns.   Final Clinical Impressions(s) / ED Diagnoses   Final diagnoses:  Cervical radiculopathy    ED Discharge Orders        Ordered    predniSONE (DELTASONE) 20 MG tablet  Daily     03/29/18 2302    methocarbamol (ROBAXIN) 500 MG tablet  2 times daily PRN  03/29/18 2302       Antony Madura, PA-C 03/29/18 2344    Raeford Razor, MD 04/01/18 0120

## 2018-06-14 ENCOUNTER — Emergency Department (HOSPITAL_COMMUNITY)
Admission: EM | Admit: 2018-06-14 | Discharge: 2018-06-14 | Disposition: A | Discharge status: Other Acute Inpatient Hospital | Payer: Self-pay | Attending: Emergency Medicine | Admitting: Emergency Medicine

## 2018-06-14 ENCOUNTER — Encounter (HOSPITAL_COMMUNITY): Payer: Self-pay | Admitting: Emergency Medicine

## 2018-06-14 ENCOUNTER — Emergency Department (HOSPITAL_COMMUNITY): Payer: Self-pay

## 2018-06-14 ENCOUNTER — Encounter (HOSPITAL_COMMUNITY): Payer: Self-pay

## 2018-06-14 ENCOUNTER — Inpatient Hospital Stay (HOSPITAL_COMMUNITY)
Admission: AD | Admit: 2018-06-14 | Discharge: 2018-06-20 | DRG: 882 | Disposition: A | Discharge status: Home / Self Care | Payer: Federal, State, Local not specified - Other | Source: Intra-hospital | Attending: Psychiatry | Admitting: Psychiatry

## 2018-06-14 ENCOUNTER — Other Ambulatory Visit: Payer: Self-pay

## 2018-06-14 DIAGNOSIS — F314 Bipolar disorder, current episode depressed, severe, without psychotic features: Secondary | ICD-10-CM | POA: Diagnosis not present

## 2018-06-14 DIAGNOSIS — Z79899 Other long term (current) drug therapy: Secondary | ICD-10-CM | POA: Insufficient documentation

## 2018-06-14 DIAGNOSIS — F1290 Cannabis use, unspecified, uncomplicated: Secondary | ICD-10-CM | POA: Diagnosis present

## 2018-06-14 DIAGNOSIS — R634 Abnormal weight loss: Secondary | ICD-10-CM | POA: Diagnosis present

## 2018-06-14 DIAGNOSIS — A6000 Herpesviral infection of urogenital system, unspecified: Secondary | ICD-10-CM | POA: Diagnosis present

## 2018-06-14 DIAGNOSIS — F410 Panic disorder [episodic paroxysmal anxiety] without agoraphobia: Secondary | ICD-10-CM | POA: Diagnosis present

## 2018-06-14 DIAGNOSIS — G4700 Insomnia, unspecified: Secondary | ICD-10-CM | POA: Diagnosis not present

## 2018-06-14 DIAGNOSIS — F4310 Post-traumatic stress disorder, unspecified: Secondary | ICD-10-CM | POA: Diagnosis not present

## 2018-06-14 DIAGNOSIS — Z7952 Long term (current) use of systemic steroids: Secondary | ICD-10-CM

## 2018-06-14 DIAGNOSIS — R45851 Suicidal ideations: Secondary | ICD-10-CM | POA: Diagnosis present

## 2018-06-14 DIAGNOSIS — Z791 Long term (current) use of non-steroidal anti-inflammatories (NSAID): Secondary | ICD-10-CM | POA: Diagnosis not present

## 2018-06-14 DIAGNOSIS — Z885 Allergy status to narcotic agent status: Secondary | ICD-10-CM

## 2018-06-14 DIAGNOSIS — R630 Anorexia: Secondary | ICD-10-CM | POA: Diagnosis present

## 2018-06-14 DIAGNOSIS — Z91410 Personal history of adult physical and sexual abuse: Secondary | ICD-10-CM | POA: Diagnosis not present

## 2018-06-14 DIAGNOSIS — Z6820 Body mass index (BMI) 20.0-20.9, adult: Secondary | ICD-10-CM

## 2018-06-14 DIAGNOSIS — F17210 Nicotine dependence, cigarettes, uncomplicated: Secondary | ICD-10-CM | POA: Insufficient documentation

## 2018-06-14 DIAGNOSIS — F312 Bipolar disorder, current episode manic severe with psychotic features: Secondary | ICD-10-CM | POA: Insufficient documentation

## 2018-06-14 DIAGNOSIS — T7431XA Adult psychological abuse, confirmed, initial encounter: Secondary | ICD-10-CM | POA: Insufficient documentation

## 2018-06-14 DIAGNOSIS — Z62810 Personal history of physical and sexual abuse in childhood: Secondary | ICD-10-CM | POA: Diagnosis present

## 2018-06-14 DIAGNOSIS — R4182 Altered mental status, unspecified: Secondary | ICD-10-CM | POA: Insufficient documentation

## 2018-06-14 DIAGNOSIS — Z23 Encounter for immunization: Secondary | ICD-10-CM

## 2018-06-14 DIAGNOSIS — F419 Anxiety disorder, unspecified: Secondary | ICD-10-CM | POA: Diagnosis not present

## 2018-06-14 DIAGNOSIS — F22 Delusional disorders: Secondary | ICD-10-CM | POA: Diagnosis present

## 2018-06-14 LAB — URINALYSIS, ROUTINE W REFLEX MICROSCOPIC
BILIRUBIN URINE: NEGATIVE
GLUCOSE, UA: NEGATIVE mg/dL
HGB URINE DIPSTICK: NEGATIVE
Ketones, ur: 5 mg/dL — AB
Leukocytes, UA: NEGATIVE
Nitrite: NEGATIVE
Protein, ur: NEGATIVE mg/dL
SPECIFIC GRAVITY, URINE: 1.012 (ref 1.005–1.030)
pH: 8 (ref 5.0–8.0)

## 2018-06-14 LAB — CBC WITH DIFFERENTIAL/PLATELET
Abs Immature Granulocytes: 0.02 10*3/uL (ref 0.00–0.07)
BASOS PCT: 1 %
Basophils Absolute: 0.1 10*3/uL (ref 0.0–0.1)
EOS ABS: 0.1 10*3/uL (ref 0.0–0.5)
EOS PCT: 1 %
HCT: 40.3 % (ref 36.0–46.0)
HEMOGLOBIN: 13.9 g/dL (ref 12.0–15.0)
Immature Granulocytes: 0 %
LYMPHS PCT: 27 %
Lymphs Abs: 2.4 10*3/uL (ref 0.7–4.0)
MCH: 31.7 pg (ref 26.0–34.0)
MCHC: 34.5 g/dL (ref 30.0–36.0)
MCV: 91.8 fL (ref 80.0–100.0)
Monocytes Absolute: 0.6 10*3/uL (ref 0.1–1.0)
Monocytes Relative: 7 %
Neutro Abs: 5.7 10*3/uL (ref 1.7–7.7)
Neutrophils Relative %: 64 %
Platelets: 420 10*3/uL — ABNORMAL HIGH (ref 150–400)
RBC: 4.39 MIL/uL (ref 3.87–5.11)
RDW: 12 % (ref 11.5–15.5)
WBC: 9 10*3/uL (ref 4.0–10.5)
nRBC: 0 % (ref 0.0–0.2)

## 2018-06-14 LAB — COMPREHENSIVE METABOLIC PANEL
ALK PHOS: 46 U/L (ref 38–126)
ALT: 11 U/L (ref 0–44)
AST: 15 U/L (ref 15–41)
Albumin: 4.6 g/dL (ref 3.5–5.0)
Anion gap: 11 (ref 5–15)
BUN: 9 mg/dL (ref 6–20)
CALCIUM: 9.6 mg/dL (ref 8.9–10.3)
CO2: 22 mmol/L (ref 22–32)
CREATININE: 0.75 mg/dL (ref 0.44–1.00)
Chloride: 108 mmol/L (ref 98–111)
GFR calc Af Amer: >60 mL/min (ref >60)
GFR calc non Af Amer: >60 mL/min (ref >60)
Glucose, Bld: 91 mg/dL (ref 70–99)
Potassium: 3.9 mmol/L (ref 3.5–5.1)
SODIUM: 141 mmol/L (ref 135–145)
Total Bilirubin: 1.3 mg/dL — ABNORMAL HIGH (ref 0.3–1.2)
Total Protein: 7.4 g/dL (ref 6.5–8.1)

## 2018-06-14 LAB — ETHANOL: Alcohol, Ethyl (B): <10 mg/dL (ref <10)

## 2018-06-14 LAB — RAPID URINE DRUG SCREEN, HOSP PERFORMED
AMPHETAMINES: NOT DETECTED
BARBITURATES: NOT DETECTED
Benzodiazepines: NOT DETECTED
Cocaine: NOT DETECTED
Opiates: NOT DETECTED
TETRAHYDROCANNABINOL: POSITIVE — AB

## 2018-06-14 LAB — PREGNANCY, URINE: Preg Test, Ur: NEGATIVE

## 2018-06-14 MED ORDER — HYDROXYZINE HCL 25 MG PO TABS: 25.0000 mg | ORAL_TABLET | Freq: Three times a day (TID) | ORAL | Status: DC | PRN

## 2018-06-14 MED ORDER — INFLUENZA VAC SPLIT QUAD 0.5 ML IM SUSY
0.5000 mL | PREFILLED_SYRINGE | INTRAMUSCULAR | Status: AC
  Administered 2018-06-15: 0.5 mL via INTRAMUSCULAR
  Filled 2018-06-14: qty 0.5

## 2018-06-14 MED ORDER — ALUM & MAG HYDROXIDE-SIMETH 200-200-20 MG/5ML PO SUSP: 30.0000 mL | ORAL | Status: DC | PRN

## 2018-06-14 MED ORDER — ENSURE ENLIVE PO LIQD
237.0000 mL | Freq: Two times a day (BID) | ORAL | Status: DC
  Administered 2018-06-15 – 2018-06-18 (×8): 237 mL via ORAL

## 2018-06-14 MED ORDER — ZIPRASIDONE MESYLATE 20 MG IM SOLR: 20.0000 mg | Freq: Two times a day (BID) | INTRAMUSCULAR | Status: DC | PRN

## 2018-06-14 MED ORDER — LORAZEPAM 1 MG PO TABS
1.0000 mg | ORAL_TABLET | ORAL | Status: AC | PRN
  Administered 2018-06-14: 1 mg via ORAL
  Filled 2018-06-14: qty 1

## 2018-06-14 MED ORDER — MAGNESIUM HYDROXIDE 400 MG/5ML PO SUSP
30.0000 mL | Freq: Every day | ORAL | Status: DC | PRN
  Administered 2018-06-16 – 2018-06-17 (×2): 30 mL via ORAL
  Filled 2018-06-14 (×2): qty 30

## 2018-06-14 MED ORDER — PNEUMOCOCCAL VAC POLYVALENT 25 MCG/0.5ML IJ INJ
0.5000 mL | INJECTION | INTRAMUSCULAR | Status: AC
  Administered 2018-06-15: 0.5 mL via INTRAMUSCULAR

## 2018-06-14 MED ORDER — TRAZODONE HCL 50 MG PO TABS
50.0000 mg | ORAL_TABLET | Freq: Every evening | ORAL | Status: DC | PRN
  Administered 2018-06-14 – 2018-06-16 (×3): 50 mg via ORAL
  Filled 2018-06-14 (×2): qty 1
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
## 2018-06-14 NOTE — BH Assessment (Addendum)
[facility] Assessment Progress Note  Per Juanetta Beets, DO, this pt requires psychiatric hospitalization at this time.  Malva Limes, RN, [facility] has assigned pt to [facility] Rm [id number]; [facility] will be ready to receive pt at 20:00.  Pt has signed Voluntary Admission and Consent for Treatment, as well as Consent to Release Information to her aunt and to her counselor, Wilber Oliphant, at the [facility], and a notification call has been placed to the provider.  Signed forms have been faxed to [facility]  Pt's nurse, Angelica Chessman, has been notified, and agrees to send original paperwork along with pt via Juel Burrow, and to call report to [phone].  [place] may be reached at [phone].  Doylene Canning, [place] [facility] Coordinator [phone]

## 2018-06-14 NOTE — ED Notes (Signed)
Bed: ZO10 Expected date:  Expected time:  Means of arrival:  Comments: EMS- 32yo F, paranoid
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
## 2018-06-14 NOTE — ED Notes (Addendum)
Patient and CSW attempted to call many family members to take care of patient's daughter while patient is hospitalized. Patient's aunt called and arranged for church friends to take care of the patient's daughter while she is voluntarily admitted to [facility] for psychiatric care. Patient voluntarily gave her daughter to Vance Gather [phone]) and Aurther Loft [phone]) Hyacinth Meeker. Patient's daughter transported via the friend's personal vehicle, with a car seat in place. Charge RN [place] and AD Toniann Fail present when patient voluntarily gave her daughter to her friends for care. Vance Gather and Aurther Loft Miller's copies of drivers licenses in chart.

## 2018-06-14 NOTE — ED Notes (Signed)
Pt alert and oriented, pt denies any pain at this time. Pt denies any SI,HI, AND AVH. PT cooperative, an answers all questions appropriately. Pt resting in bed quietly. Will continue to monitor.
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
## 2018-06-14 NOTE — ED Notes (Addendum)
SW reported that pt's church members Aurther Loft and Iona Coach is on their way to pick up the pt's daughter.  Pt verified that she know them.  SW also reported to this nurse that pt's boyfriend wanted to come pick up their car which is on both of their names.  Pt states the car is not here, she states it's at [facility].

## 2018-06-15 DIAGNOSIS — F314 Bipolar disorder, current episode depressed, severe, without psychotic features: Secondary | ICD-10-CM

## 2018-06-15 DIAGNOSIS — F4310 Post-traumatic stress disorder, unspecified: Principal | ICD-10-CM

## 2018-06-15 MED ORDER — HYDROXYZINE HCL 50 MG PO TABS
50.0000 mg | ORAL_TABLET | Freq: Four times a day (QID) | ORAL | Status: DC | PRN
  Administered 2018-06-15 – 2018-06-19 (×5): 50 mg via ORAL
  Filled 2018-06-15: qty 1
  Filled 2018-06-15: qty 10
  Filled 2018-06-15 (×4): qty 1

## 2018-06-15 MED ORDER — ARIPIPRAZOLE 10 MG PO TABS
10.0000 mg | ORAL_TABLET | Freq: Every day | ORAL | Status: DC
  Administered 2018-06-15 – 2018-06-16 (×2): 10 mg via ORAL
  Filled 2018-06-15 (×3): qty 1

## 2018-06-15 MED ORDER — SERTRALINE HCL 50 MG PO TABS
50.0000 mg | ORAL_TABLET | Freq: Every day | ORAL | Status: DC
  Administered 2018-06-15 – 2018-06-17 (×3): 50 mg via ORAL
  Filled 2018-06-15 (×4): qty 1

## 2018-06-15 NOTE — Plan of Care (Signed)
Progress note  D: pt found in bed; compliant with medication administration. Pt states she slept fair. Pt rates her depression/hopelessness/anxiety an 8/6/8 out of 10 respectively. Pt has complaints of cravings, agitation, and chills. Pt denies any physical pain, rating this a 0/10. Pt states her goal for today ist o overcome her anxiety and depression and she will achieve this by thinking positive. Pt denies any si/hi/ah/vh and verbally agrees to approach staff if these become apparent or before harming herself while at [facility]. A: pt provided support and encouragement. Pt given medications per protocol and standing orders. Q57m safety checks implemented and continued.  R: pt safe on the unit. Will continue to monitor.   Pt progressing in the following metrics  Problem: Education: Goal: Knowledge of [facility] Education information/materials will improve Outcome: Progressing Goal: Emotional status will improve Outcome: Progressing Goal: Mental status will improve Outcome: Progressing Goal: Verbalization of understanding the information provided will improve Outcome: Progressing   Problem: Activity: Goal: Interest or engagement in activities will improve Outcome: Progressing Goal: Sleeping patterns will improve Outcome: Progressing

## 2018-06-15 NOTE — Progress Notes (Signed)
Nursing note 7p-7a  Pt observed interacting with peers on unit this shift. Displayed a worried affect and preoccupied/pleasant mood upon interaction with this Clinical research associate. Pt denies pain ,denies SI/HI, and also denies any audio or visual hallucinations at this time. Pt does endorse anxiety 7/10 and feeling afraid of the other patients. See MAR for prn medication administration.  Pt is able to verbally contract for safety with this RN. Stated she will come to staff when she feels afraid. Goal:" I want to learn how to do a resume and get a job." Pt is now resting in bed with eyes closed, with no signs or symptoms of pain or distress noted. Pt continues to remain safe on the unit and is observed by rounding every 15 min. RN will continue to monitor.

## 2018-06-15 NOTE — Progress Notes (Signed)
Nutrition Brief Note  Patient identified on the Malnutrition Screening Tool (MST) Report  Patient admitted with acute [psychosis. Pt reports fair appetite and denies weight changes. Weight records show a weight decrease but unable to verify time frames.  Ensure supplements have been ordered.  Wt Readings from Last 15 Encounters:  06/14/18 48.5 kg  06/14/18 68 kg  02/20/16 71.2 kg    Body mass index is 20.05 kg/m. Patient meets criteria for normal based on current BMI.  Labs and medications reviewed.    If nutrition issues arise, please consult RD.   Tilda Franco, MS, RD, LDN [facility] Inpatient Clinical Dietitian Pager: [phone] After Hours Pager: [phone]
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
## 2018-06-15 NOTE — Progress Notes (Signed)
Recreation Therapy Notes  Date: 10.11.19 Time: 1000 Location: 500 Hall Dayroom   Group Topic: Communication, Team Building, Problem Solving  Goal Area(s) Addresses:  Patient will effectively work with peer towards shared goal.  Patient will identify skills used to make activity successful.  Patient will identify how skills used during activity can be used to reach post d/c goals.   Intervention: STEM Activity, Music  Activity: Landing Pad. In teams patients were given 12 plastic drinking straws and a length of masking tape. Using the materials provided patients were asked to build a landing pad to catch a golf ball dropped from approximately 6 feet in the air.   Education: Social Skills, Discharge Planning   Education Outcome: Acknowledges education/In group clarification offered/Needs additional education.   Clinical Observations/Feedback: Pt did not attend group.    Issac Moure, LRT/CTRS         Coni Homesley A 06/15/2018 11:29 AM 

## 2018-06-15 NOTE — Plan of Care (Signed)
  Problem: Education: Goal: Knowledge of [facility] Education information/materials will improve Outcome: Progressing Goal: Emotional status will improve Outcome: Progressing Goal: Mental status will improve Outcome: Progressing Goal: Verbalization of understanding the information provided will improve Outcome: Progressing   Problem: Activity: Goal: Interest or engagement in activities will improve Outcome: Progressing Goal: Sleeping patterns will improve Outcome: Progressing   Problem: Coping: Goal: Ability to verbalize frustrations and anger appropriately will improve Outcome: Progressing Goal: Ability to demonstrate self-control will improve Outcome: Progressing   Problem: Health Behavior/Discharge Planning: Goal: Identification of resources available to assist in meeting health care needs will improve Outcome: Progressing Goal: Compliance with treatment plan for underlying cause of condition will improve Outcome: Progressing   Problem: Physical Regulation: Goal: Ability to maintain clinical measurements within normal limits will improve Outcome: Progressing   Problem: Safety: Goal: Periods of time without injury will increase Outcome: Progressing   
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
## 2018-06-15 NOTE — [facility] Suicide Risk Assessment (Signed)
[facility] INPATIENT:  Family/Significant Other Suicide Prevention Education  Suicide Prevention Education:  Education Completed; No one has been identified by the patient as the family member/significant other with whom the patient will be residing, and identified as the person(s) who will aid the patient in the event of a mental health crisis (suicidal ideations/suicide attempt).  With written consent from the patient, the family member/significant other has been provided the following suicide prevention education, prior to the and/or following the discharge of the patient.  The suicide prevention education provided includes the following:  Suicide risk factors  Suicide prevention and interventions  National Suicide Hotline telephone number  [facility] assessment telephone number  [facility] Emergency Assistance 911  [facility] and/or Residential Mobile Crisis Unit telephone number  Request made of family/significant other to:  Remove weapons (e.g., guns, rifles, knives), all items previously/currently identified as safety concern.    Remove drugs/medications (over-the-counter, prescriptions, illicit drugs), all items previously/currently identified as a safety concern.  The family member/significant other verbalizes understanding of the suicide prevention education information provided.  The family member/significant other agrees to remove the items of safety concern listed above. The patient did not endorse SI at the time of admission, nor did the patient c/o SI during the stay here.  SPE not required.  Baldo Daub [facility] 06/15/2018, 8:13 AM
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
## 2018-06-15 NOTE — [facility] Group Notes (Signed)
Group Therapy: Chaplain[facility] Group  Participation Level:  Active  Participation Quality:  Attentive  Affect:  Flat  Cognitive:  Oriented  Insight:  Limited  Engagement in Therapy:  Limited  Modes of Intervention:  Discussion, Socialization  Summary of Progress/Problems:  Chaplain was here to lead a group on themes of hope and courage. Lindsey Prince attended the entire session. Lindsey Prince engaged in the discussion and gave positive feedback to encourage other members. During the activity Lindsey Prince described Lindsey Prince daughter as inspiring Lindsey Prince.   [name] MSW Intern 06/15/2018 1:17 PM

## 2018-06-16 MED ORDER — ARIPIPRAZOLE 5 MG PO TABS
5.0000 mg | ORAL_TABLET | Freq: Once | ORAL | Status: AC
  Administered 2018-06-16: 5 mg via ORAL
  Filled 2018-06-16: qty 1

## 2018-06-16 MED ORDER — NICOTINE 21 MG/24HR TD PT24
MEDICATED_PATCH | TRANSDERMAL | Status: AC
  Filled 2018-06-16: qty 1

## 2018-06-16 MED ORDER — NICOTINE POLACRILEX 2 MG MT GUM
2.0000 mg | CHEWING_GUM | OROMUCOSAL | Status: DC | PRN
  Filled 2018-06-16: qty 1

## 2018-06-16 MED ORDER — NICOTINE 21 MG/24HR TD PT24
21.0000 mg | MEDICATED_PATCH | Freq: Every day | TRANSDERMAL | Status: DC
  Administered 2018-06-16 – 2018-06-20 (×4): 21 mg via TRANSDERMAL
  Filled 2018-06-16 (×6): qty 1

## 2018-06-16 MED ORDER — ARIPIPRAZOLE 15 MG PO TABS
15.0000 mg | ORAL_TABLET | Freq: Every day | ORAL | Status: DC
  Filled 2018-06-16: qty 1
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
## 2018-06-16 NOTE — Progress Notes (Signed)
Adult Psychoeducational Group Note  Date:  06/16/2018 Time:  9:54 PM  Group Topic/Focus:  Wrap-Up Group:   The focus of this group is to help patients review their daily goal of treatment and discuss progress on daily workbooks.  Participation Level:  Active  Participation Quality:  Appropriate  Affect:  Appropriate  Cognitive:  Appropriate  Insight: Appropriate  Engagement in Group:  Engaged  Modes of Intervention:  Discussion  Additional Comments: The patient expressed that she rates today a 7.the patient also said that she attended all groups.  Octavio Manns 06/16/2018, 9:54 PM

## 2018-06-16 NOTE — [facility] Group Notes (Signed)
[facility] Group Notes:  (Nursing)  Date:  06/16/2018  Time:1000 Type of Therapy:  Nurse Education  Participation Level:  Active  Participation Quality:  Appropriate and Attentive  Affect:  Appropriate  Cognitive:  Appropriate  Insight:  Appropriate  Engagement in Group:  Engaged  Modes of Intervention:  Discussion and Education  Summary of Progress/Problems: Nurse led group played a non competitive learning/communication board game that fosters listening skills as well as self expression.  Shela Nevin 06/16/2018, 5:19 PM

## 2018-06-16 NOTE — [facility] Group Notes (Signed)
  [facility] LCSW Group Therapy Note  Date/Time:  06/16/2018 11:15AM-12:00PM  Type of Therapy and Topic:  Group Therapy:  Reasons for Hospitalizations  Participation Level:  Active   Description of Group This process group involved patients discussing their thoughts about why people, in their experiences, may need to be hospitalized or may be seen by family members/people in the community as needing to be hospitalized.   Feelings related to these reasons were discussed and group members were allowed to vent their positive and negative reactions.  The group then brainstormed specific ways in which they could take care of themselves outside of the hospital in order to avoid rehospitalization.  CSW ensured that this list included such items as staying on medications, going to aftercare appointments, taking care of sleep hygiene, using a pillbox, open communication with support systems, and truthfully acknowledging symptoms to doctors, therapists, and supporters.  Therapeutic Goals 1. Patient will identify a variety of reasons that exist for people to become psychiatrically hospitalized, and will discuss why it may be necessary even though it is not desired/liked. 2. Patient will verbalize benefits of hospitalization to those who need it and ways in which those benefits can be replicated in an outpatient setting. 3. Patients will brainstorm together ways they can take care of themselves after discharge in order to avoid rehospitalization.  Summary of Patient Progress:  The patient expressed that she is in the hospital because she had a panic attack.  She was the most engaged group member throughout our time together, and was very attentive.   In response to another patient's question, CSW provided psychoeducation about the diagnoses of Bipolar disorder, Schizophrenia, and Schizoaffective disorder.  Pt found this very interesting and expressed a desire to learn more about her own diagnoses.  She was  insightful and showed a strong commitment to taking care of herself at discharge.  Therapeutic Modalities Cognitive Behavioral Therapy Motivational Interviewing    Ambrose Mantle, LCSW 06/16/2018, 8:46 AM

## 2018-06-16 NOTE — Progress Notes (Signed)
D. Pt presents with an anxious affect and paranoid, preoccupied mood. Pt reports feeling nauseous and having thrown up this am- believes it may be 'nerves'.Pt extremely paraoid about others on the unit. Sat with pt to talk 1:1 in the dayroom- reassured her that she was safe-  Pt expressing distrust, not wanting to share information about herself to this nurse, stating, "no one believes me" and "you all think I'm crazy". Per pt's self inventory, pt rates her depression, hopelessness and anxiety a 03/08/09, respectively. Pt writes that her most important goal today is "overcoming paranoia, speaking up" and writes that she will, "talk more, express myself more". On her self inventory sheet, she writes that she has "fear of someone trying to hurt me emotionally".  Pt currently denies SI/HI and AVH  A. Labs and vitals monitored. Pt compliant with medications. Pt provided with ginger ale for nausea. Pt supported emotionally and encouraged to express concerns and ask questions.   R. Pt remains safe with 15 minute checks. Will continue POC.

## 2018-06-16 NOTE — Progress Notes (Signed)
D. Pt presents with a much brighter affect this evening- she has been calm and cooperative, observed sitting in the dayroom watching the ball game with peers. Pt reports a much improved mood since starting her meds-pt currently denies SI/HI and AV hallucinations A. Labs and vitals monitored. Pt compliant with medications. Pt supported emotionally and encouraged to express concerns and ask questions.   R. Pt remains safe with 15 minute checks. Will continue POC.

## 2018-06-17 LAB — TSH: TSH: 1.93 u[IU]/mL (ref 0.350–4.500)

## 2018-06-17 MED ORDER — SERTRALINE HCL 25 MG PO TABS
75.0000 mg | ORAL_TABLET | Freq: Every day | ORAL | Status: DC
  Administered 2018-06-18 – 2018-06-20 (×3): 75 mg via ORAL
  Filled 2018-06-17: qty 21
  Filled 2018-06-17 (×4): qty 3

## 2018-06-17 MED ORDER — TRAZODONE HCL 100 MG PO TABS
100.0000 mg | ORAL_TABLET | Freq: Every evening | ORAL | Status: DC | PRN
  Administered 2018-06-17 – 2018-06-19 (×3): 100 mg via ORAL
  Filled 2018-06-17: qty 1
  Filled 2018-06-17: qty 7
  Filled 2018-06-17 (×2): qty 1

## 2018-06-17 MED ORDER — ARIPIPRAZOLE 10 MG PO TABS
20.0000 mg | ORAL_TABLET | Freq: Every day | ORAL | Status: DC
  Administered 2018-06-17 – 2018-06-19 (×3): 20 mg via ORAL
  Filled 2018-06-17 (×3): qty 2
  Filled 2018-06-17: qty 14
  Filled 2018-06-17: qty 2

## 2018-06-17 NOTE — Plan of Care (Signed)
Progress Note  D: pt found in bed; compliant with medication administration. Pt states she slept fair. Pt rates her depression/hopelessness/anxiety a 5/6/8 out of 10 respectively. Pt has complaints of tremors, diarrhea, cravings, agitation, chills, cramps, nausea, irritability and hot flashes. Pt rates her pain at a 5/10 in her abdomen. Pt states her goals for today is to be more confident, speak up, and work on her memory. Pt states she will achieve this by speaking up more. Pt denies any si/hi/ah/vh and verbally agrees to approach staff if these become apparent or before harming herself while at [facility]. A: pt provided support and encouragement. Pt given medications per protocol and standing orders. Q29m safety checks implemented and continued.  R: pt safe on the unit. Will continue to monitor.   Pt progressing in the following metrics  Problem: Activity: Goal: Interest or engagement in activities will improve Outcome: Progressing Goal: Sleeping patterns will improve Outcome: Progressing   Problem: Coping: Goal: Ability to verbalize frustrations and anger appropriately will improve Outcome: Progressing Goal: Ability to demonstrate self-control will improve Outcome: Progressing   Problem: Health Behavior/Discharge Planning: Goal: Compliance with treatment plan for underlying cause of condition will improve Outcome: Progressing   Problem: Physical Regulation: Goal: Ability to maintain clinical measurements within normal limits will improve Outcome: Progressing   Problem: Safety: Goal: Periods of time without injury will increase Outcome: Progressing
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
## 2018-06-17 NOTE — Progress Notes (Signed)
Patient is resting in bed with eyes closed. Respirations even and non labored. No distress noted. Monitoring continues. 

## 2018-06-17 NOTE — [facility] Group Notes (Signed)
Adult Psychoeducational Group Note  Date:  06/17/2018 Time:  9:00 AM  Group Topic/Focus:  Goals Group:   The focus of this group is to help patients establish daily goals to achieve during treatment and discuss how the patient can incorporate goal setting into their daily lives to aide in recovery.  Participation Level:  Active  Participation Quality:  Appropriate  Affect:  Appropriate  Cognitive:  Alert  Insight: Appropriate  Engagement in Group:  Engaged  Modes of Intervention:  Orientation  Additional Comments:   Pt attend orientation/goals group. Pt goal for today is to learn more about herself and to gain confidence.  Dellia Nims 06/17/2018, 9:00 AM

## 2018-06-17 NOTE — Progress Notes (Signed)
D: Pt denies SI/HI/AVH. Pt is pleasant and cooperative. Pt presents paranoid . Pt stated she was feeling better due to the medications and feeling like she really wants to do in life.  A: Pt was offered support and encouragement. Pt was given scheduled medications. Pt was encourage to attend groups. Q 15 minute checks were done for safety.  R:Pt attends groups and interacts well with peers and staff. Pt is taking medication. Pt has no complaints.Pt receptive to treatment and safety maintained on unit.  Problem: Education: Goal: Emotional status will improve Outcome: Progressing   Problem: Education: Goal: Mental status will improve Outcome: Progressing   Problem: Activity: Goal: Interest or engagement in activities will improve Outcome: Progressing   Problem: Activity: Goal: Sleeping patterns will improve Outcome: Progressing

## 2018-06-17 NOTE — [facility] Group Notes (Signed)
[facility] LCSW Group Therapy Note  Date/Time:  06/17/2018  11:00AM-12:00PM  Type of Therapy and Topic:  Group Therapy:  Music and Mood  Participation Level:  Active   Description of Group: In this process group, members listened to a variety of genres of music and identified that different types of music evoke different responses.  Patients were encouraged to identify music that was soothing for them and music that was energizing for them.  Patients discussed how this knowledge can help with wellness and recovery in various ways including managing depression and anxiety as well as encouraging healthy sleep habits.    Therapeutic Goals: 1. Patients will explore the impact of different varieties of music on mood 2. Patients will verbalize the thoughts they have when listening to different types of music 3. Patients will identify music that is soothing to them as well as music that is energizing to them 4. Patients will discuss how to use this knowledge to assist in maintaining wellness and recovery 5. Patients will explore the use of music as a coping skill  Summary of Patient Progress:  At the beginning of group, patient expressed that she was depressed, rating this a 9 out of 10.  She asked for numerous songs to be changed, saying they reminded her of bad experiences.  She said at the end of group her depression had been reduced to about a 5.  Therapeutic Modalities: Solution Focused Brief Therapy Activity   Ambrose Mantle, LCSW

## 2018-06-18 MED ORDER — VALACYCLOVIR HCL 500 MG PO TABS
500.0000 mg | ORAL_TABLET | Freq: Two times a day (BID) | ORAL | Status: DC
  Administered 2018-06-18 – 2018-06-20 (×5): 500 mg via ORAL
  Filled 2018-06-18 (×6): qty 1

## 2018-06-18 MED ORDER — VALACYCLOVIR HCL 500 MG PO TABS: 500.0000 mg | ORAL_TABLET | Freq: Two times a day (BID) | ORAL | Status: DC
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
## 2018-06-18 NOTE — [facility] Group Notes (Signed)
[facility] LCSW Group Therapy Note  Date/Time: 06/18/18, 1315  Type of Therapy and Topic:  Group Therapy:  Overcoming Obstacles  Participation Level:  active  Description of Group:    In this group patients will be encouraged to explore what they see as obstacles to their own wellness and recovery. They will be guided to discuss their thoughts, feelings, and behaviors related to these obstacles. The group will process together ways to cope with barriers, with attention given to specific choices patients can make. Each patient will be challenged to identify changes they are motivated to make in order to overcome their obstacles. This group will be process-oriented, with patients participating in exploration of their own experiences as well as giving and receiving support and challenge from other group members.  Therapeutic Goals: 1. Patient will identify personal and current obstacles as they relate to admission. 2. Patient will identify barriers that currently interfere with their wellness or overcoming obstacles.  3. Patient will identify feelings, thought process and behaviors related to these barriers. 4. Patient will identify two changes they are willing to make to overcome these obstacles:    Summary of Patient Progress: Pt active in group, shared that she had obstacles in mental health, housing, education.  Another pt attempted to interact with her at one point, and pt became upset, accused him of giving her a hard time, and then shared that others on the unit were also causing her trouble.  Still displaying paranoia.       Therapeutic Modalities:   Cognitive Behavioral Therapy Solution Focused Therapy Motivational Interviewing Relapse Prevention Therapy  Daleen Squibb, LCSW

## 2018-06-18 NOTE — Progress Notes (Signed)
D: Pt was in her room upon initial approach.  Pt presents with anxious affect and mood.  She brightens with interaction.  Pt describes her day as "good" and reports her goal today was "confidence and speaking up for myself."  She reports her goal for tomorrow is "connecting with people."  Pt denies SI/HI, denies hallucinations, denies pain.  Pt has been visible in milieu interacting with peers and staff appropriately.  Pt attended evening group.  A: Introduced self to pt.  Actively listened to pt and offered support and encouragement. Medication administered per order.  PRN medication administered for anxiety and sleep.  Q15 minute safety checks maintained.  R: Pt is safe on the unit.  Pt is compliant with medications.  Pt verbally contracts for safety and reports she will inform staff of needs and concerns.  Will continue to monitor and assess.

## 2018-06-18 NOTE — Progress Notes (Signed)
Recreation Therapy Notes  Date: 10.14.19 Time: 1000 Location: 500 Hall Dayroom  Group Topic: Communication  Goal Area(s) Addresses:  Patient will identify triggers. Patient will identify ways to avoid triggers. Patient will identify ways to deal with triggers head on.  Behavioral Response: Engaged  Intervention: Worksheet, pencils  Activity: Triggers.  Patients were to identify their 3 biggest triggers, strategies to avoid those triggers and strategies to address triggers head on.  Education: Communication, Discharge Planning  Education Outcome: Acknowledges understanding/In group clarification offered/Needs additional education.   Clinical Observations/Feedback: Pt stated triggers were "something that can bring back a memory, it can be a song or a date".  Pt identified her triggers as certain music, tv/movies, birthdays/holidays or certain people.  Pt expressed she could avoid her triggers by cutting off the music/tv, find new traditions, avoid/ignore them or change her circle.  Pt expressed facing her triggers head on by think of someone/place that makes her happy, go somewhere that makes her think of happy times and avoid the person.    Caroll Rancher, LRT/CTRS      Lillia Abed, Jamerson Vonbargen A 06/18/2018 11:06 AM

## 2018-06-18 NOTE — Plan of Care (Signed)
Progress Note  D: pt found in the hallway; pt compliant with medication administration. Pt states she slept poorly. Pt rates her depression/hopelessness/anxiety a 6/6/8 out of 10 respectively. Pt complains of tremors, blurry vision, agitation, and irritability. Pt denies any physical pain to this writer, rating this a 0/10. Pt states her goal for today is to be more confident, and learn to deal when people don't like her. Pt would also like to deal with her paranoia, and will achieve these by ignoring people and pushing through her anxiety. Pt denies any si/hi/ah/vh and verbally agrees to approach staff if these become apparent or before harming herself while at [facility]. A: pt provided support and encouragement. Pt given medication per protocol and standing orders. Q31m safety checks implemented and continued.  R: pt safe on the unit. Will continue to monitor.   Pt progressing in the following metrics  Problem: Education: Goal: Emotional status will improve Outcome: Progressing Goal: Mental status will improve Outcome: Progressing Goal: Verbalization of understanding the information provided will improve Outcome: Progressing   Problem: Activity: Goal: Interest or engagement in activities will improve Outcome: Progressing   Problem: Coping: Goal: Ability to verbalize frustrations and anger appropriately will improve Outcome: Progressing Goal: Ability to demonstrate self-control will improve Outcome: Progressing

## 2018-06-19 DIAGNOSIS — B009 Herpesviral infection, unspecified: Secondary | ICD-10-CM

## 2018-06-19 DIAGNOSIS — R451 Restlessness and agitation: Secondary | ICD-10-CM

## 2018-06-19 DIAGNOSIS — F17210 Nicotine dependence, cigarettes, uncomplicated: Secondary | ICD-10-CM

## 2018-06-19 DIAGNOSIS — F419 Anxiety disorder, unspecified: Secondary | ICD-10-CM

## 2018-06-19 DIAGNOSIS — G4700 Insomnia, unspecified: Secondary | ICD-10-CM

## 2018-06-19 MED ORDER — IBUPROFEN 600 MG PO TABS
600.0000 mg | ORAL_TABLET | Freq: Four times a day (QID) | ORAL | Status: DC | PRN
  Administered 2018-06-19: 600 mg via ORAL
  Filled 2018-06-19: qty 1

## 2018-06-19 NOTE — [facility] Group Notes (Signed)
LCSW Group Therapy Note  06/19/2018 1:15pm  Type of Therapy/Topic:  Group Therapy:  Feelings about Diagnosis  Participation Level:  Minimal   Description of Group:   This group will allow patients to explore their thoughts and feelings about diagnoses they have received. Patients will be guided to explore their level of understanding and acceptance of these diagnoses. Facilitator will encourage patients to process their thoughts and feelings about the reactions of others to their diagnosis and will guide patients in identifying ways to discuss their diagnosis with significant others in their lives. This group will be process-oriented, with patients participating in exploration of their own experiences, giving and receiving support, and processing challenge from other group members.   Therapeutic Goals: 1. Patient will demonstrate understanding of diagnosis as evidenced by identifying two or more symptoms of the disorder 2. Patient will be able to express two feelings regarding the diagnosis 3. Patient will demonstrate their ability to communicate their needs through discussion and/or role play  Summary of Patient Progress:       Therapeutic Modalities:   Cognitive Behavioral Therapy Brief Therapy Feelings Identification    Ida Rogue, LCSW 06/19/2018 2:27 PM
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
## 2018-06-19 NOTE — Plan of Care (Signed)
  Problem: Activity: Goal: Sleeping patterns will improve Outcome: Progressing Note:  Pt slept 6.75 hours last night per flowsheet.    

## 2018-06-19 NOTE — Progress Notes (Signed)
Adult Psychoeducational Group Note  Date:  06/19/2018 Time:  9:16 PM  Group Topic/Focus:  Wrap-Up Group:   The focus of this group is to help patients review their daily goal of treatment and discuss progress on daily workbooks.  Participation Level:  Active  Participation Quality:  Appropriate  Affect:  Appropriate  Cognitive:  Appropriate  Insight: Appropriate  Engagement in Group:  Engaged  Modes of Intervention:  Discussion  Additional Comments:  Patient attended group and said that her day was a 9.  Her goal for today was to discuss her discharge and she did. Patient will be discharge tomorrow.  Jah Alarid W [name] 06/19/2018, 9:16 PM

## 2018-06-19 NOTE — Progress Notes (Signed)
Patient denies SI, HI and AVH.  Patient has been irritable this shift and randomly stated to nurse "I know we may not see eye to eye, but I have love for everyone"  Then she walked and went to his room.     Assess patient for safety, offer medications for safety, engage patient in 1:1 staff talks.   Patient able to contract for safety. Continue to monitor as planned.

## 2018-06-19 NOTE — Progress Notes (Signed)
Recreation Therapy Notes  Date: 10.15.19 Time: 1000 Location: 500 Hall Dayroom  Group Topic: Wellness  Goal Area(s) Addresses:  Patient will define components of whole wellness. Patient will verbalize benefit of whole wellness.  Behavioral Response: Engaged  Intervention: Music, Exercise  Activity: Exercise.  LRT led patients in a series of stretches to get them warmed up and loose.  Each patient would then lead the group in an exercise of their choice.  The group will complete 3 to 4 rounds of exercise.  Patients could take water breaks as needed.  Education: Wellness, Building control surveyor.   Education Outcome: Acknowledges education/In group clarification offered/Needs additional education.   Clinical Observations/Feedback: Pt was engaged, smiling and very active during group.  Pt expressed that people tend not to spend time exercising because "they are busy".  Pt expressed that she felt good and energized at the completion of group.    Caroll Rancher, LRT/CTRS    Lillia Abed, Ivoree Felmlee A 06/19/2018 11:13 AM

## 2018-06-20 LAB — URINALYSIS, ROUTINE W REFLEX MICROSCOPIC
Bilirubin Urine: NEGATIVE
Glucose, UA: NEGATIVE mg/dL
HGB URINE DIPSTICK: NEGATIVE
KETONES UR: NEGATIVE mg/dL
LEUKOCYTES UA: NEGATIVE
Nitrite: NEGATIVE
PROTEIN: NEGATIVE mg/dL
Specific Gravity, Urine: 1.008 (ref 1.005–1.030)
pH: 6 (ref 5.0–8.0)

## 2018-06-20 MED ORDER — SERTRALINE HCL 25 MG PO TABS: 75.0000 mg | ORAL_TABLET | Freq: Every day | ORAL | 1 refills | Status: DC

## 2018-06-20 MED ORDER — TRAZODONE HCL 100 MG PO TABS: 100.0000 mg | ORAL_TABLET | Freq: Every evening | ORAL | 0 refills | Status: DC | PRN

## 2018-06-20 MED ORDER — HYDROXYZINE HCL 50 MG PO TABS: 50.0000 mg | ORAL_TABLET | Freq: Four times a day (QID) | ORAL | 0 refills | Status: DC | PRN

## 2018-06-20 MED ORDER — VALACYCLOVIR HCL 500 MG PO TABS
500.0000 mg | ORAL_TABLET | Freq: Two times a day (BID) | ORAL | Status: DC
  Filled 2018-06-20: qty 1

## 2018-06-20 MED ORDER — ARIPIPRAZOLE 20 MG PO TABS: 20.0000 mg | ORAL_TABLET | Freq: Every day | ORAL | 0 refills | Status: DC

## 2018-06-20 NOTE — Progress Notes (Addendum)
  [facility] Adult Case Management Discharge Plan :  Will you be returning to the same living situation after discharge:  No. See below At discharge, do you have transportation home?: Yes,  [facility] contact Do you have the ability to pay for your medications: Yes,  mental health  Release of information consent forms completed and in the chart;  Patient's signature needed at discharge.  Patient to Follow up at: Follow-up Information    [facility] Follow up on 06/25/2018.   Why:  Monday at 8AM with Nicole Cella for [facility] hospital follow up appoiontment.  Bring your ID and your hospital d/c paperwork Contact information: [address] [place] [address] [phone]           Next level of care provider has access to [facility] Link:no  Safety Planning and Suicide Prevention discussed: Yes,  yes     Has patient been referred to the Quitline?: Patient refused referral  Patient has been referred for addiction treatment: Yes  Ida Rogue, LCSW 06/20/2018, 8:41 AM

## 2018-06-20 NOTE — Plan of Care (Signed)
Pt was able to identify coping strategies for anxiety at completion of recreation therapy group sessions.   Caroll Rancher, LRT/CTRS
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
## 2018-06-20 NOTE — Progress Notes (Signed)
D: Pt A & O X 3. Denies SI, HI, AVH and pain at this time. D/C home as ordered. Picked up in lobby by [employer] (2 females). A: D/C instructions reviewed with pt including prescriptions, medication samples and follow up appointment; compliance encouraged. All belongings from locker #1 given to pt at time of departure. Scheduled and PRN medications given with verbal education and effects monitored. Safety checks maintained without incident till time of d/c.  R: Pt receptive to care. Compliant with medications when offered. Denies adverse drug reactions when assessed. Verbalized understanding related to d/c instructions. Signed belonging sheet in agreement with items received from locker. Ambulatory with a steady gait. Appears to be in no physical distress at time of departure.

## 2018-06-20 NOTE — Progress Notes (Signed)
Recreation Therapy Notes  Date: 10.16.19 Time: 1000 Location: 500 Hall Dayroom  Group Topic: Communication  Goal Area(s) Addresses:  Patient will effectively communicate with peers in group.  Patient will verbalize benefit of healthy communication. Patient will verbalize positive effect of healthy communication on post d/c goals.   Behavioral Response: Engaged  Intervention: Merchandiser, retail, pencils, blank paper  Activity: Back to Back Drawings.  Patients were paired off into groups of two. Patients are seated back to back.  One person in each group was given a picture to describe to their partner.  The partner is to draw the picture as it is described to them.  The person drawing could not ask any questions of the person giving the instructions.  Once finished, partners will compare pictures and then switch roles.  LRT will then give each group a different picture.  Education: Communication, Discharge Planning  Education Outcome: Acknowledges understanding/In group clarification offered/Needs additional education.   Clinical Observations/Feedback:  Pt was bright and smiling during group.  Pt stated that body language and eye contact were elements of communication. Pt stated her partner gave good directions but needed to slow down.  Pt felt she also gave good instructions but that her partner was "a little off" on the drawing.  Pt explained that in a real conversation, she could ask the person she is talking to if they understand what you are saying.    Caroll Rancher, LRT/CTRS    Caroll Rancher A 06/20/2018 11:37 AM

## 2018-06-20 NOTE — Progress Notes (Signed)
Recreation Therapy Notes  INPATIENT RECREATION TR PLAN  Patient Details Name: Lindsey Prince MRN: [id number] DOB: February 12, 1986 Today's Date: 06/20/2018  Rec Therapy Plan Is patient appropriate for Therapeutic Recreation?: Yes Treatment times per week: about 3 days Estimated Length of Stay: 5-7 days TR Treatment/Interventions: Group participation (Comment)  Discharge Criteria Pt will be discharged from therapy if:: Discharged Treatment plan/goals/alternatives discussed and agreed upon by:: Patient/family  Discharge Summary Short term goals set: See patient care plan Short term goals met: Complete Progress toward goals comments: Groups attended Which groups?: Wellness, Communication, Other (Comment)(Triggers) Reason goals not met: None Therapeutic equipment acquired: N/A Reason patient discharged from therapy: Discharge from hospital Pt/family agrees with progress & goals achieved: Yes Date patient discharged from therapy: 06/20/18     Victorino Sparrow, LRT/CTRS  Ria Comment, [place] 06/20/2018, 12:50 PM
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
## 2019-06-20 ENCOUNTER — Other Ambulatory Visit: Payer: Self-pay

## 2019-06-20 DIAGNOSIS — Z20822 Contact with and (suspected) exposure to covid-19: Secondary | ICD-10-CM

## 2019-06-22 LAB — NOVEL CORONAVIRUS, NAA: SARS-CoV-2, NAA: NOT DETECTED

## 2019-07-19 ENCOUNTER — Other Ambulatory Visit: Payer: Self-pay

## 2019-07-19 ENCOUNTER — Encounter (HOSPITAL_COMMUNITY): Payer: Self-pay

## 2019-07-19 ENCOUNTER — Ambulatory Visit (HOSPITAL_COMMUNITY)
Admission: EM | Admit: 2019-07-19 | Discharge: 2019-07-19 | Disposition: A | Discharge status: Home / Self Care | Payer: Self-pay | Attending: Emergency Medicine | Admitting: Emergency Medicine

## 2019-07-19 DIAGNOSIS — R234 Changes in skin texture: Secondary | ICD-10-CM

## 2019-07-19 DIAGNOSIS — B353 Tinea pedis: Secondary | ICD-10-CM

## 2019-07-19 MED ORDER — TERBINAFINE HCL POWD: 1.0000 "application " | Freq: Two times a day (BID) | 0 refills | Status: DC

## 2019-07-19 NOTE — Discharge Instructions (Signed)
Keep foot dry best you can, change out socks on your breaks if you can, wear breathable shoes. Take off socks and go barefoot when able.  Use of powder in between toes until healing.  If symptoms worsen or do not improve in the next week to return to be seen or to follow up with your PCP.

## 2019-07-19 NOTE — ED Triage Notes (Signed)
Pt presents with a wound between toes on right foot; pt states it started off as a big blister and is not healing well.

## 2019-07-19 NOTE — ED Provider Notes (Signed)
[facility]    CSN: [id number] Arrival date & time: 07/19/19  0940      History   Chief Complaint Chief Complaint  Patient presents with   Wound Check    HPI Lindsey Prince is a 33 y.o. female.   Lindsey Prince presents with complaints of blister in between her her 3rd and 4th toes which hasn't been healing, and is painful. It opened and now is left as a fissure. She had the same to the other foot as well, which has healed. No drainage. She feels it is worse because of her work shoes, she works in [employer], on her feet, and does get sweaty. She wears leather shoes. She is not diabetic. No other rash. Doesn't itch. No known MRSA history. History of anemia, bipolar.    ROS per HPI, negative if not otherwise mentioned.      Past Medical History:  Diagnosis Date   Anemia    Herpes     Patient Active Problem List   Diagnosis Date Noted   Severe bipolar I disorder, current or most recent episode depressed (HCC) 06/15/2018   PTSD (post-traumatic stress disorder) 06/14/2018   Vaginal delivery 02/21/2016   First degree perineal laceration during delivery 02/21/2016    Past Surgical History:  Procedure Laterality Date   NO PAST SURGERIES      OB History    Gravida  2   Para  2   Term  2   Preterm      AB      Living  1     SAB      TAB      Ectopic      Multiple  0   Live Births  1            Home Medications    Prior to Admission medications   Medication Sig Start Date End Date Taking? Authorizing Provider  ARIPiprazole (ABILIFY) 20 MG tablet Take 1 tablet (20 mg total) by mouth at bedtime. 06/20/18   Micheal Likens, MD  hydrOXYzine (ATARAX/VISTARIL) 50 MG tablet Take 1 tablet (50 mg total) by mouth every 6 (six) hours as needed for anxiety. 06/20/18   Micheal Likens, MD  ibuprofen (ADVIL,MOTRIN) 200 MG tablet Take 400 mg by mouth every 6 (six) hours as needed for headache, mild pain, moderate pain or  cramping.    [provider]  sertraline (ZOLOFT) 25 MG tablet Take 3 tablets (75 mg total) by mouth daily. 06/21/18   Micheal Likens, MD  Terbinafine HCl POWD 1 application by Does not apply route 2 (two) times daily. 07/19/19   Georgetta Haber, NP  traZODone (DESYREL) 100 MG tablet Take 1 tablet (100 mg total) by mouth at bedtime as needed for sleep. 06/20/18   Micheal Likens, MD    Family History Family History  Problem Relation Age of Onset   Healthy Neg Hx     Social History Social History   Tobacco Use   Smoking status: Current Every Day Smoker    Packs/day: 1.50    Types: Cigarettes   Smokeless tobacco: Never Used  Substance Use Topics   Alcohol use: No   Drug use: Yes    Types: Marijuana     Allergies   Percocet [oxycodone-acetaminophen]   Review of Systems Review of Systems   Physical Exam Triage Vital Signs ED Triage Vitals  Enc Vitals Group     BP 07/19/19 1106 119/60  Pulse Rate 07/19/19 1106 60     Resp 07/19/19 1106 18     Temp 07/19/19 1106 98.1 F (36.7 C)     Temp Source 07/19/19 1106 Oral     SpO2 07/19/19 1106 99 %     Weight --      Height --      Head Circumference --      Peak Flow --      Pain Score 07/19/19 1107 8     Pain Loc --      Pain Edu? --      Excl. in [place]? --    No data found.  Updated Vital Signs BP 119/60 (BP Location: Right Arm)    Pulse 60    Temp 98.1 F (36.7 C) (Oral)    Resp 18    LMP 07/16/2019    SpO2 99%   Visual Acuity Right Eye Distance:   Left Eye Distance:   Bilateral Distance:    Right Eye Near:   Left Eye Near:    Bilateral Near:     Physical Exam Constitutional:      General: She is not in acute distress.    Appearance: She is well-developed.  Cardiovascular:     Rate and Rhythm: Normal rate.  Pulmonary:     Effort: Pulmonary effort is normal.  Feet:     Comments: Hyper pigmented healing thin skin to interdigit region of 4th and 5th toe to left foot;  right foot with open fissure to inter digit space in between 3rd and 4th toes; no surrounding redness; no drainage; no other rash Skin:    General: Skin is warm and dry.  Neurological:     Mental Status: She is alert and oriented to person, place, and time.      UC Treatments / Results  Labs (all labs ordered are listed, but only abnormal results are displayed) Labs Reviewed - No data to display  EKG   Radiology No results found.  Procedures Procedures (including critical care time)  Medications Ordered in UC Medications - No data to display  Initial Impression / Assessment and Plan / UC Course  I have reviewed the triage vital signs and the nursing notes.  Pertinent labs & imaging results that were available during my care of the patient were reviewed by me and considered in my medical decision making (see chart for details).     Does not appear infected. No drainage, no redness. Interdigit fissure, which I suspect is related to moisture to the feet. Encouraged changing of socks regularly to keep dry. May use foot powder to keep feet dry as well. Return precautions provided. Patient verbalized understanding and agreeable to plan.   Final Clinical Impressions(s) / UC Diagnoses   Final diagnoses:  Fissure in skin of foot  Tinea pedis of right foot     Discharge Instructions     Keep foot dry best you can, change out socks on your breaks if you can, wear breathable shoes. Take off socks and go barefoot when able.  Use of powder in between toes until healing.  If symptoms worsen or do not improve in the next week to return to be seen or to follow up with your PCP.      ED Prescriptions    Medication Sig Dispense Auth. Provider   Terbinafine HCl POWD 1 application by Does not apply route 2 (two) times daily. 100 g Zigmund Gottron, NP     PDMP not  reviewed this encounter.   Georgetta HaberBurky, Jhayla Podgorski B, NP 07/19/19 1229

## 2020-02-18 ENCOUNTER — Encounter (HOSPITAL_COMMUNITY): Payer: Self-pay | Admitting: Emergency Medicine

## 2020-02-18 ENCOUNTER — Emergency Department (HOSPITAL_COMMUNITY)
Admission: EM | Admit: 2020-02-18 | Discharge: 2020-02-18 | Disposition: A | Discharge status: Home / Self Care | Payer: Self-pay | Attending: Emergency Medicine | Admitting: Emergency Medicine

## 2020-02-18 ENCOUNTER — Emergency Department (HOSPITAL_COMMUNITY): Payer: Self-pay

## 2020-02-18 ENCOUNTER — Other Ambulatory Visit: Payer: Self-pay

## 2020-02-18 DIAGNOSIS — Z79899 Other long term (current) drug therapy: Secondary | ICD-10-CM | POA: Insufficient documentation

## 2020-02-18 DIAGNOSIS — S99929A Unspecified injury of unspecified foot, initial encounter: Secondary | ICD-10-CM

## 2020-02-18 DIAGNOSIS — Y9311 Activity, swimming: Secondary | ICD-10-CM | POA: Insufficient documentation

## 2020-02-18 DIAGNOSIS — Y999 Unspecified external cause status: Secondary | ICD-10-CM | POA: Insufficient documentation

## 2020-02-18 DIAGNOSIS — F17210 Nicotine dependence, cigarettes, uncomplicated: Secondary | ICD-10-CM | POA: Insufficient documentation

## 2020-02-18 DIAGNOSIS — S99921A Unspecified injury of right foot, initial encounter: Secondary | ICD-10-CM

## 2020-02-18 DIAGNOSIS — W2209XA Striking against other stationary object, initial encounter: Secondary | ICD-10-CM | POA: Insufficient documentation

## 2020-02-18 DIAGNOSIS — Y9234 Swimming pool (public) as the place of occurrence of the external cause: Secondary | ICD-10-CM | POA: Insufficient documentation

## 2020-02-18 DIAGNOSIS — S90121A Contusion of right lesser toe(s) without damage to nail, initial encounter: Secondary | ICD-10-CM | POA: Insufficient documentation
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
## 2020-02-18 NOTE — ED Notes (Addendum)
Applied post op shoe on pt's foot and buddy taped pt's toe. Pt tolerated well, educated pt on shoe. Gave pt d/c instructions and pt agreeable to discharge. Esignature pad not available.

## 2020-02-18 NOTE — Discharge Instructions (Addendum)
At this time there does not appear to be the presence of an emergent medical condition, however there is always the potential for conditions to change. Please read and follow the below instructions.  Please return to the Emergency Department immediately for any new or worsening symptoms. Please be sure to follow up with your Primary Care Provider within one week regarding your visit today; please call their office to schedule an appointment even if you are feeling better for a follow-up visit. Please call the orthopedic specialist Dr. Carola Frost under discharge paperwork for further evaluation. As we discussed there is a possibility of an unseen fracture of your toe today. Additionally there is likely injury of soft tissues such as ligaments or tendons. Follow-up with orthopedist is recommended for further evaluation and imaging as needed. Use the postop shoe to protect your foot from further injury. Use rest ice and elevation to help with pain.  Get help right away if: You lose feeling (have numbness) in your toe or foot, and it is getting worse. Your toe or your foot tingles. Your toe or your foot gets cold or turns blue. You have redness or swelling in your toe or foot, and it is getting worse. You have very bad pain. You have fever or chills You have any new/concerning or worsening of symptoms  Please read the additional information packets attached to your discharge summary.  Do not take your medicine if  develop an itchy rash, swelling in your mouth or lips, or difficulty breathing; call 911 and seek immediate emergency medical attention if this occurs.  Note: Portions of this text may have been transcribed using voice recognition software. Every effort was made to ensure accuracy; however, inadvertent computerized transcription errors may still be present.
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
## 2020-12-15 ENCOUNTER — Encounter (HOSPITAL_COMMUNITY): Payer: Self-pay

## 2020-12-15 ENCOUNTER — Emergency Department (HOSPITAL_COMMUNITY): Payer: Self-pay

## 2020-12-15 ENCOUNTER — Other Ambulatory Visit: Payer: Self-pay

## 2020-12-15 ENCOUNTER — Emergency Department (HOSPITAL_COMMUNITY)
Admission: EM | Admit: 2020-12-15 | Discharge: 2020-12-15 | Disposition: A | Discharge status: Home / Self Care | Payer: Self-pay | Attending: Emergency Medicine | Admitting: Emergency Medicine

## 2020-12-15 DIAGNOSIS — S00511A Abrasion of lip, initial encounter: Secondary | ICD-10-CM | POA: Insufficient documentation

## 2020-12-15 DIAGNOSIS — Z79899 Other long term (current) drug therapy: Secondary | ICD-10-CM | POA: Insufficient documentation

## 2020-12-15 DIAGNOSIS — F17210 Nicotine dependence, cigarettes, uncomplicated: Secondary | ICD-10-CM | POA: Insufficient documentation

## 2020-12-15 DIAGNOSIS — R519 Headache, unspecified: Secondary | ICD-10-CM | POA: Insufficient documentation

## 2020-12-15 MED ORDER — ACETAMINOPHEN 500 MG PO TABS
1000.0000 mg | ORAL_TABLET | Freq: Once | ORAL | Status: AC
  Administered 2020-12-15: 1000 mg via ORAL
  Filled 2020-12-15: qty 2
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
## 2020-12-15 NOTE — ED Notes (Addendum)
An After Visit Summary was printed and given to the patient. Discharge instructions given and no further questions at this time.  Pt states her friend is coming to pick up her and her daughter, pt states she feels safe with this friend and wants to leave with him. Pt given cell phone charger to charge her phone until her ride arrives.
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
## 2020-12-15 NOTE — Discharge Instructions (Addendum)
Your CT head/neck was within normal limits today.   You may alternate ibuprofen or tylenol to help with your symptoms.   Follow up with your primary care physician as needed.

## 2021-01-24 ENCOUNTER — Emergency Department (HOSPITAL_COMMUNITY)
Admission: EM | Admit: 2021-01-24 | Discharge: 2021-01-24 | Disposition: A | Discharge status: Other Acute Inpatient Hospital | Payer: Self-pay | Attending: Emergency Medicine | Admitting: Emergency Medicine

## 2021-01-24 ENCOUNTER — Other Ambulatory Visit: Payer: Self-pay

## 2021-01-24 ENCOUNTER — Ambulatory Visit (HOSPITAL_COMMUNITY)
Admission: EM | Admit: 2021-01-24 | Discharge: 2021-01-25 | Disposition: A | Discharge status: Other Acute Inpatient Hospital | Payer: No Payment, Other | Attending: Psychiatry | Admitting: Psychiatry

## 2021-01-24 DIAGNOSIS — F315 Bipolar disorder, current episode depressed, severe, with psychotic features: Secondary | ICD-10-CM | POA: Insufficient documentation

## 2021-01-24 DIAGNOSIS — F314 Bipolar disorder, current episode depressed, severe, without psychotic features: Secondary | ICD-10-CM | POA: Diagnosis present

## 2021-01-24 DIAGNOSIS — F600 Paranoid personality disorder: Secondary | ICD-10-CM | POA: Insufficient documentation

## 2021-01-24 DIAGNOSIS — F4310 Post-traumatic stress disorder, unspecified: Secondary | ICD-10-CM | POA: Insufficient documentation

## 2021-01-24 DIAGNOSIS — F17210 Nicotine dependence, cigarettes, uncomplicated: Secondary | ICD-10-CM | POA: Insufficient documentation

## 2021-01-24 DIAGNOSIS — F22 Delusional disorders: Secondary | ICD-10-CM

## 2021-01-24 DIAGNOSIS — M546 Pain in thoracic spine: Secondary | ICD-10-CM | POA: Insufficient documentation

## 2021-01-24 DIAGNOSIS — Z20822 Contact with and (suspected) exposure to covid-19: Secondary | ICD-10-CM | POA: Insufficient documentation

## 2021-01-24 DIAGNOSIS — G8929 Other chronic pain: Secondary | ICD-10-CM | POA: Insufficient documentation

## 2021-01-24 DIAGNOSIS — F99 Mental disorder, not otherwise specified: Secondary | ICD-10-CM | POA: Insufficient documentation

## 2021-01-24 DIAGNOSIS — F332 Major depressive disorder, recurrent severe without psychotic features: Secondary | ICD-10-CM | POA: Insufficient documentation

## 2021-01-24 LAB — BASIC METABOLIC PANEL
Anion gap: 11 (ref 5–15)
BUN: 8 mg/dL (ref 6–20)
CO2: 24 mmol/L (ref 22–32)
Calcium: 9.6 mg/dL (ref 8.9–10.3)
Chloride: 100 mmol/L (ref 98–111)
Creatinine, Ser: 0.69 mg/dL (ref 0.44–1.00)
GFR, Estimated: >60 mL/min (ref >60)
Glucose, Bld: 97 mg/dL (ref 70–99)
Potassium: 3.4 mmol/L — ABNORMAL LOW (ref 3.5–5.1)
Sodium: 135 mmol/L (ref 135–145)

## 2021-01-24 LAB — CBC WITH DIFFERENTIAL/PLATELET
Abs Immature Granulocytes: 0.04 10*3/uL (ref 0.00–0.07)
Basophils Absolute: 0.1 10*3/uL (ref 0.0–0.1)
Basophils Relative: 1 %
Eosinophils Absolute: 0.1 10*3/uL (ref 0.0–0.5)
Eosinophils Relative: 0 %
HCT: 39.4 % (ref 36.0–46.0)
Hemoglobin: 13.4 g/dL (ref 12.0–15.0)
Immature Granulocytes: 0 %
Lymphocytes Relative: 22 %
Lymphs Abs: 3.2 10*3/uL (ref 0.7–4.0)
MCH: 31.7 pg (ref 26.0–34.0)
MCHC: 34 g/dL (ref 30.0–36.0)
MCV: 93.1 fL (ref 80.0–100.0)
Monocytes Absolute: 0.9 10*3/uL (ref 0.1–1.0)
Monocytes Relative: 6 %
Neutro Abs: 10.2 10*3/uL — ABNORMAL HIGH (ref 1.7–7.7)
Neutrophils Relative %: 71 %
Platelets: 505 10*3/uL — ABNORMAL HIGH (ref 150–400)
RBC: 4.23 MIL/uL (ref 3.87–5.11)
RDW: 12.5 % (ref 11.5–15.5)
WBC: 14.5 10*3/uL — ABNORMAL HIGH (ref 4.0–10.5)
nRBC: 0 % (ref 0.0–0.2)

## 2021-01-24 LAB — LIPID PANEL
Cholesterol: 145 mg/dL (ref 0–200)
HDL: 73 mg/dL (ref >40)
LDL Cholesterol: 47 mg/dL (ref 0–99)
Total CHOL/HDL Ratio: 2 RATIO
Triglycerides: 125 mg/dL (ref <150)
VLDL: 25 mg/dL (ref 0–40)

## 2021-01-24 LAB — POCT URINE DRUG SCREEN - MANUAL ENTRY (I-SCREEN)
POC Amphetamine UR: NOT DETECTED
POC Buprenorphine (BUP): NOT DETECTED
POC Cocaine UR: NOT DETECTED
POC Marijuana UR: NOT DETECTED
POC Methadone UR: NOT DETECTED
POC Methamphetamine UR: NOT DETECTED
POC Morphine: NOT DETECTED
POC Oxazepam (BZO): NOT DETECTED
POC Oxycodone UR: NOT DETECTED
POC Secobarbital (BAR): NOT DETECTED

## 2021-01-24 LAB — I-STAT BETA HCG BLOOD, ED (MC, WL, AP ONLY): I-stat hCG, quantitative: <5 m[IU]/mL (ref <5)

## 2021-01-24 LAB — HEMOGLOBIN A1C
Hgb A1c MFr Bld: 5.4 % (ref 4.8–5.6)
Mean Plasma Glucose: 108.28 mg/dL

## 2021-01-24 LAB — RESP PANEL BY RT-PCR (FLU A&B, COVID) ARPGX2
Influenza A by PCR: NEGATIVE
Influenza B by PCR: NEGATIVE
SARS Coronavirus 2 by RT PCR: NEGATIVE

## 2021-01-24 LAB — ETHANOL: Alcohol, Ethyl (B): <10 mg/dL (ref <10)

## 2021-01-24 LAB — TSH: TSH: 2.119 u[IU]/mL (ref 0.350–4.500)

## 2021-01-24 MED ORDER — HYDROXYZINE HCL 25 MG PO TABS
25.0000 mg | ORAL_TABLET | Freq: Three times a day (TID) | ORAL | Status: DC | PRN
  Administered 2021-01-24 – 2021-01-25 (×3): 25 mg via ORAL
  Filled 2021-01-24 (×3): qty 1

## 2021-01-24 MED ORDER — NAPROXEN 500 MG PO TABS: 500.0000 mg | ORAL_TABLET | Freq: Two times a day (BID) | ORAL | Status: DC | PRN

## 2021-01-24 MED ORDER — TRAZODONE HCL 50 MG PO TABS
50.0000 mg | ORAL_TABLET | Freq: Every evening | ORAL | Status: DC | PRN
  Administered 2021-01-24: 50 mg via ORAL
  Filled 2021-01-24: qty 1

## 2021-01-24 MED ORDER — POTASSIUM CHLORIDE CRYS ER 20 MEQ PO TBCR
20.0000 meq | EXTENDED_RELEASE_TABLET | Freq: Once | ORAL | Status: AC
  Administered 2021-01-24: 20 meq via ORAL
  Filled 2021-01-24: qty 1

## 2021-01-24 MED ORDER — ACETAMINOPHEN 325 MG PO TABS
650.0000 mg | ORAL_TABLET | Freq: Four times a day (QID) | ORAL | Status: DC | PRN
  Administered 2021-01-24 – 2021-01-25 (×3): 650 mg via ORAL
  Filled 2021-01-24 (×3): qty 2

## 2021-01-24 MED ORDER — ACETAMINOPHEN 325 MG PO TABS: 650.0000 mg | ORAL_TABLET | ORAL | Status: DC | PRN

## 2021-01-24 MED ORDER — ALUM & MAG HYDROXIDE-SIMETH 200-200-20 MG/5ML PO SUSP: 30.0000 mL | ORAL | Status: DC | PRN

## 2021-01-24 MED ORDER — MAGNESIUM HYDROXIDE 400 MG/5ML PO SUSP
30.0000 mL | Freq: Every day | ORAL | Status: DC | PRN
  Administered 2021-01-24: 30 mL via ORAL
  Filled 2021-01-24: qty 30
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
## 2021-01-24 NOTE — ED Notes (Signed)
Pt given snack. 
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
## 2021-01-24 NOTE — ED Notes (Signed)
Pt given dinner  

## 2021-01-24 NOTE — ED Notes (Signed)
TTS in progress 
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
## 2021-01-24 NOTE — ED Notes (Signed)
Pt given pretzels, nutrigrain bar and cheesestick for snack.
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
## 2021-01-24 NOTE — Progress Notes (Signed)
Pt is asleep. Respirations are even and unlabored. No signs of acute distress noted. Staff will monitor for pt's safety. 

## 2021-01-24 NOTE — ED Notes (Signed)
Pt taking a shower 
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
## 2021-01-24 NOTE — ED Notes (Signed)
Pt given lunch
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
## 2021-01-24 NOTE — ED Notes (Addendum)
One patient belonging bag and one black backpack locked in locker 28, patient changed into scrubs

## 2021-01-24 NOTE — Progress Notes (Signed)
Pt is alert and oriented. Pt complained of pain. PRN Tylenol and Vistaril administered for anxiety. Pt was tearful and paranoid this AM stating that staff were talking and laughing about her. Pt was reassured that staff is here for pt's wellbeing and support was given. Pt appreciative of support. Pt denies SI/HI AVH at this time. Staff will monitor for pt's safety.

## 2021-01-24 NOTE — ED Notes (Signed)
Pt admitted to continuous assessment due to paranoia. Pt A&O x4, cooperative but anxious. Pt tolerated skin assessment well. Pt ambulated independently to unit. Oriented to unit/staff. [place] sandwich and OJ given per pt request. No signs of acute distress noted. Will continue to monitor for safety.

## 2021-01-24 NOTE — ED Notes (Signed)
Pt A&O x 4, sleeping at present, no distress noted. Calm & cooperative. Monitoring for safety at present.

## 2021-01-24 NOTE — ED Notes (Addendum)
Pt reports she is feeling increased anxiety and anger over being here.  NP Ene and NP Shalon notified.  Reassurance given.  Trazadone given for sleep.

## 2021-01-25 ENCOUNTER — Inpatient Hospital Stay (HOSPITAL_COMMUNITY)
Admission: AD | Admit: 2021-01-25 | Discharge: 2021-02-07 | DRG: 885 | Disposition: A | Discharge status: Home / Self Care | Payer: Federal, State, Local not specified - Other | Source: Intra-hospital | Attending: Behavioral Health | Admitting: Behavioral Health

## 2021-01-25 ENCOUNTER — Other Ambulatory Visit: Payer: Self-pay

## 2021-01-25 ENCOUNTER — Encounter (HOSPITAL_COMMUNITY): Payer: Self-pay | Admitting: Psychiatry

## 2021-01-25 DIAGNOSIS — Z79899 Other long term (current) drug therapy: Secondary | ICD-10-CM

## 2021-01-25 DIAGNOSIS — F22 Delusional disorders: Secondary | ICD-10-CM | POA: Diagnosis present

## 2021-01-25 DIAGNOSIS — F314 Bipolar disorder, current episode depressed, severe, without psychotic features: Secondary | ICD-10-CM | POA: Diagnosis not present

## 2021-01-25 DIAGNOSIS — F4310 Post-traumatic stress disorder, unspecified: Secondary | ICD-10-CM | POA: Diagnosis not present

## 2021-01-25 DIAGNOSIS — I10 Essential (primary) hypertension: Secondary | ICD-10-CM | POA: Diagnosis present

## 2021-01-25 DIAGNOSIS — G4700 Insomnia, unspecified: Secondary | ICD-10-CM | POA: Diagnosis present

## 2021-01-25 DIAGNOSIS — R000 Tachycardia, unspecified: Secondary | ICD-10-CM | POA: Diagnosis present

## 2021-01-25 DIAGNOSIS — F17210 Nicotine dependence, cigarettes, uncomplicated: Secondary | ICD-10-CM | POA: Diagnosis present

## 2021-01-25 DIAGNOSIS — R0981 Nasal congestion: Secondary | ICD-10-CM | POA: Diagnosis present

## 2021-01-25 DIAGNOSIS — F319 Bipolar disorder, unspecified: Secondary | ICD-10-CM | POA: Diagnosis not present

## 2021-01-25 DIAGNOSIS — F29 Unspecified psychosis not due to a substance or known physiological condition: Secondary | ICD-10-CM | POA: Diagnosis present

## 2021-01-25 LAB — URINALYSIS, ROUTINE W REFLEX MICROSCOPIC
Bilirubin Urine: NEGATIVE
Glucose, UA: NEGATIVE mg/dL
Ketones, ur: NEGATIVE mg/dL
Nitrite: NEGATIVE
Protein, ur: NEGATIVE mg/dL
Specific Gravity, Urine: 1.003 — ABNORMAL LOW (ref 1.005–1.030)
pH: 7 (ref 5.0–8.0)

## 2021-01-25 MED ORDER — OLANZAPINE 5 MG PO TBDP
5.0000 mg | ORAL_TABLET | Freq: Every day | ORAL | Status: DC
  Administered 2021-01-25: 5 mg via ORAL
  Filled 2021-01-25: qty 1

## 2021-01-25 MED ORDER — OLANZAPINE 5 MG PO TBDP: 5.0000 mg | ORAL_TABLET | Freq: Every day | ORAL | Status: DC

## 2021-01-25 MED ORDER — OLANZAPINE 10 MG PO TBDP
10.0000 mg | ORAL_TABLET | Freq: Three times a day (TID) | ORAL | Status: DC | PRN
  Administered 2021-01-26 – 2021-01-31 (×5): 10 mg via ORAL
  Filled 2021-01-25 (×4): qty 1

## 2021-01-25 MED ORDER — OLANZAPINE 10 MG PO TBDP: 10.0000 mg | ORAL_TABLET | Freq: Three times a day (TID) | ORAL | Status: DC | PRN

## 2021-01-25 MED ORDER — ZIPRASIDONE MESYLATE 20 MG IM SOLR
20.0000 mg | Freq: Four times a day (QID) | INTRAMUSCULAR | Status: DC | PRN
  Administered 2021-01-30: 20 mg via INTRAMUSCULAR
  Filled 2021-01-25: qty 20

## 2021-01-25 MED ORDER — MAGNESIUM HYDROXIDE 400 MG/5ML PO SUSP
30.0000 mL | Freq: Every day | ORAL | Status: DC | PRN
  Administered 2021-01-29: 30 mL via ORAL
  Filled 2021-01-25: qty 30

## 2021-01-25 MED ORDER — LORAZEPAM 1 MG PO TABS
1.0000 mg | ORAL_TABLET | Freq: Four times a day (QID) | ORAL | Status: DC | PRN
  Administered 2021-01-28 – 2021-01-29 (×2): 1 mg via ORAL
  Filled 2021-01-25 (×2): qty 1

## 2021-01-25 MED ORDER — OLANZAPINE 5 MG PO TBDP
5.0000 mg | ORAL_TABLET | Freq: Every day | ORAL | Status: DC
  Filled 2021-01-25: qty 1

## 2021-01-25 MED ORDER — HYDROXYZINE HCL 25 MG PO TABS
25.0000 mg | ORAL_TABLET | Freq: Three times a day (TID) | ORAL | Status: DC | PRN
  Administered 2021-01-25 – 2021-02-06 (×21): 25 mg via ORAL
  Filled 2021-01-25 (×8): qty 1
  Filled 2021-01-25: qty 10
  Filled 2021-01-25 (×15): qty 1

## 2021-01-25 MED ORDER — LORAZEPAM 1 MG PO TABS: 1.0000 mg | ORAL_TABLET | ORAL | Status: DC | PRN

## 2021-01-25 MED ORDER — OLANZAPINE 5 MG PO TBDP
5.0000 mg | ORAL_TABLET | Freq: Every day | ORAL | Status: DC
  Administered 2021-01-25: 5 mg via ORAL
  Filled 2021-01-25 (×3): qty 1

## 2021-01-25 MED ORDER — ALUM & MAG HYDROXIDE-SIMETH 200-200-20 MG/5ML PO SUSP
30.0000 mL | ORAL | Status: DC | PRN
  Administered 2021-01-31: 30 mL via ORAL
  Filled 2021-01-25: qty 30

## 2021-01-25 MED ORDER — ZIPRASIDONE MESYLATE 20 MG IM SOLR: 20.0000 mg | INTRAMUSCULAR | Status: DC | PRN

## 2021-01-25 MED ORDER — TRAZODONE HCL 50 MG PO TABS
50.0000 mg | ORAL_TABLET | Freq: Every evening | ORAL | Status: DC | PRN
  Filled 2021-01-25: qty 1

## 2021-01-25 NOTE — Discharge Instructions (Signed)
Transfer to [facility] 

## 2021-01-25 NOTE — Tx Team (Signed)
Initial Treatment Plan 01/25/2021 4:09 PM Lindsey Prince KWI:[id number]    PATIENT STRESSORS: Financial difficulties Health problems Marital or family conflict Occupational concerns   PATIENT STRENGTHS: Ability for insight Capable of independent living Motivation for treatment/growth   PATIENT IDENTIFIED PROBLEMS: "Place to stay"  "Daughter's safety  Paranoia  Anxiety               DISCHARGE CRITERIA:  Ability to meet basic life and health needs Motivation to continue treatment in a less acute level of care Safe-care adequate arrangements made  PRELIMINARY DISCHARGE PLAN: Attend aftercare/continuing care group Outpatient therapy Placement in alternative living arrangements  PATIENT/FAMILY INVOLVEMENT: This treatment plan has been presented to and reviewed with the patient, Lindsey Prince.  The patient and family have been given the opportunity to ask questions and make suggestions.  Clarene Critchley, RN 01/25/2021, 4:09 PM
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
## 2021-01-25 NOTE — Progress Notes (Signed)
Lindsey Prince was transported via [employer] without incident.

## 2021-01-25 NOTE — Progress Notes (Signed)
Adult Psychoeducational Group Note  Date:  01/25/2021 Time:  9:06 PM  Group Topic/Focus:  Wrap-Up Group:   The focus of this group is to help patients review their daily goal of treatment and discuss progress on daily workbooks.  Participation Level:  Active  Participation Quality:  Appropriate  Affect:  Anxious and Excited  Cognitive:  Disorganized  Insight: Limited  Engagement in Group:  Limited  Modes of Intervention:  Discussion  Additional Comments:  Pt is a new admission. Pt stated her goal for today was to focus on her treatment plan. Pt stated she did not get a chance to talk with her doctor or her social worker about her care today. Writer informed pt she will be able to talk with her doctor and her Training and development officer.  Pt rated her overall day a 10. Pt stated she made no calls today. Pt stated she was able to attend dinner tonight. Pt stated she took all medications provided today. Pt stated her appetite was good today. Pt rated sleep last night as fair. Pt stated she had some physical pain tonight.  Pt stated she had some mild pain in her back tonight. Pt rated the mild pain in her back a 2 on the pain level scale. Pt denied visual hallucinations tonight and auditory issues tonight.  Pt denies thoughts of harming herself or others. Pt stated she would alert staff if anything changed.  [name] 01/25/2021, 9:06 PM

## 2021-01-25 NOTE — Progress Notes (Signed)
Snack given.

## 2021-01-25 NOTE — ED Notes (Signed)
Pt is sleeping, breathing unlabored, environment secured, will continue to monitor patient
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
## 2021-01-25 NOTE — Progress Notes (Addendum)
Pt is awake, alert and oriented. Pt took shower this AM and is presently sitting quietly. Pt c/o of back of neck pain. PRN Tylenol and Vistaril for anxiety administered. No signs of acute distress noted. Pt denies SI/HI/AVH at this time. Staff will monitor for safety.

## 2021-01-25 NOTE — Progress Notes (Signed)
Pt accepted to [facility] room 501-1   Patient meets inpatient criteria per Earlene Plater, MD   Dr. Fayrene Fearing is the attending provider.    Call report to [phone]    Ryta Marquis Lunch, RN @ [facility] notified.     Pt scheduled  to arrive at [facility] at 1530 PM   Signed:  Corky Crafts, MSW, LCSWA, LCASA 01/25/2021 1:11 PM

## 2021-01-26 ENCOUNTER — Telehealth (HOSPITAL_COMMUNITY): Payer: Self-pay | Admitting: Emergency Medicine

## 2021-01-26 MED ORDER — OLANZAPINE 5 MG PO TBDP
15.0000 mg | ORAL_TABLET | Freq: Every day | ORAL | Status: DC
  Administered 2021-01-26: 15 mg via ORAL
  Filled 2021-01-26 (×2): qty 3

## 2021-01-26 MED ORDER — ACETAMINOPHEN 325 MG PO TABS
650.0000 mg | ORAL_TABLET | Freq: Four times a day (QID) | ORAL | Status: DC | PRN
  Administered 2021-01-26 – 2021-02-06 (×11): 650 mg via ORAL
  Filled 2021-01-26 (×11): qty 2

## 2021-01-26 MED ORDER — WHITE PETROLATUM EX OINT
TOPICAL_OINTMENT | CUTANEOUS | Status: AC
  Filled 2021-01-26: qty 5

## 2021-01-26 NOTE — [facility] Group Notes (Signed)
[facility] Group Notes:  (Nursing/MHT/Case Management/Adjunct)  Date:  01/26/2021  Time:  2:23 PM  Type of Therapy:  Group Therapy  Participation Level:  Active  Participation Quality:  Appropriate  Affect:  Appropriate  Cognitive:  Alert and Appropriate  Insight:  Appropriate  Engagement in Group:  Engaged  Modes of Intervention:  Orientation  Summary of Progress/Problems: Her goal for today is to get on the right medications and to keep her child safe.   Lindsey Prince [name] 01/26/2021, 2:23 PM
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
## 2021-01-26 NOTE — Progress Notes (Signed)
   01/25/21 2032  Psych Admission Type (Psych Patients Only)  Admission Status Voluntary  Psychosocial Assessment  Patient Complaints Anxiety  Eye Contact Brief;Avoids  Facial Expression Anxious  Affect Anxious;Irritable  Speech Logical/coherent  Interaction Minimal;Guarded  Motor Activity Other (Comment) (WDL)  Appearance/Hygiene In scrubs  Behavior Characteristics Cooperative;Appropriate to situation  Mood Anxious  Thought Process  Coherency WDL  Content WDL  Delusions Paranoid  Perception WDL  Hallucination None reported or observed  Judgment Impaired  Confusion None  Danger to Self  Current suicidal ideation? Denies  Danger to Others  Danger to Others None reported or observed

## 2021-01-26 NOTE — Progress Notes (Signed)
Pt did not attend relaxation group.  
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
## 2021-01-26 NOTE — BH Assessment (Signed)
Care Management - Follow Up [facility] Discharges   Patient was placed in an inpatient psychiatric facility at [facility].   
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
## 2021-01-26 NOTE — Progress Notes (Signed)
Adult Psychoeducational Group Note  Date:  01/26/2021 Time:  9:24 PM  Group Topic/Focus:  Wrap-Up Group:   The focus of this group is to help patients review their daily goal of treatment and discuss progress on daily workbooks.  Participation Level:  Active  Participation Quality:  Appropriate  Affect:  Angry, Anxious, Defensive, Depressed and Irritable  Cognitive:  Disorganized and Confused  Insight: Limited  Engagement in Group:  Defensive, Limited, Off Topic and Poor  Modes of Intervention:  Discussion  Additional Comments:  Pt stated her goal for today was to focus on her treatment plan. Pt stated she accomplished her goal today. Pt stated she talk with her doctor but did not get a chance to talk with her social worker about her care today.  Pt rated her overall day a 5 out of 10.  Pt stated she did not understand why she was place on unit restriction from the incident in the cafe today. Writer asked pt to elaborate on the incident in the cafe today. Pt decided not to elaborate. Pt stated she was able to contact her daughter today which improved her overall day. Pt stated she was able to attend all meals. Pt stated she attend all groups today. Pt stated she took all medications provided today. Pt stated her appetite was good today. Pt rated sleep last night as fair.Pt stated she had some physical pain tonight.  Pt stated she had some mild pain in her back tonight. Pt rated the mild pain a 3 on the pain level scale. Pt denied visual hallucinations tonight and auditory issues tonight.  Pt denies thoughts of harming herself or others. Pt stated she would alert staff if anything changed.  [name] 01/26/2021, 9:24 PM
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
## 2021-01-26 NOTE — Progress Notes (Signed)
Recreation Therapy Notes  Date: 5.24.22 Time: 1000 Location: 500 Hall Dayroom  Group Topic: Communication  Goal Area(s) Addresses:  Patient will effectively communicate with peers in group.  Patient will verbalize benefit of healthy communication. Patient will verbalize positive effect of healthy communication on post d/c goals.  Patient will identify communication techniques that made activity effective for group.   Behavioral Response: Engaged  Intervention: Drawings, Blank paper, Pencils   Activity: Blind Drawings.  Three different volunteers were chosen to describe three different pictures to the rest of the group.  Each volunteer was to describe their picture with as much detail as necessary.  The remaining group members could only as the presenters to repeat themselves, they could not ask any detailed questions.  Education: Communication, Discharge Planning  Education Outcome: Acknowledges understanding/In group clarification offered/Needs additional education.   Clinical Observations/Feedback: Pt named two types of communication as eye contact and flowers to show actions of affection.  Pt described the first picture.  Pt seemed to get a little rattled while presenting to the group.  Pt would stop a few times and take a deep breath before continuing.  During processing, pt explained it gets nerve wrecking to her when people ask her to repeat herself.  Peers felt pt did a good job even though pt wasn't as confident in her presentation.  Pt was called out of group by doctor and did not return.     Caroll Rancher, LRT/CTRS         Caroll Rancher A 01/26/2021 12:49 PM

## 2021-01-27 DIAGNOSIS — F29 Unspecified psychosis not due to a substance or known physiological condition: Secondary | ICD-10-CM

## 2021-01-27 DIAGNOSIS — F4310 Post-traumatic stress disorder, unspecified: Secondary | ICD-10-CM

## 2021-01-27 DIAGNOSIS — F314 Bipolar disorder, current episode depressed, severe, without psychotic features: Secondary | ICD-10-CM | POA: Diagnosis not present

## 2021-01-27 MED ORDER — OLANZAPINE 10 MG PO TBDP
10.0000 mg | ORAL_TABLET | Freq: Every day | ORAL | Status: DC
  Administered 2021-01-27 – 2021-01-30 (×4): 10 mg via ORAL
  Filled 2021-01-27 (×7): qty 1

## 2021-01-27 NOTE — [facility] Group Notes (Signed)
LCSW Group Therapy Notes  Type of Therapy and Topic: Group Therapy: Healthy Vs. Unhealthy Coping Strategies  Date and Time: 01/27/2021 1:00PM  Participation Level: [facility] PARTICIPATION LEVEL: Active  Description of Group:  In this group, patients will be encouraged to explore their healthy and unhealthy coping strategics. Coping strategies are actions that we take to deal with stress, problems, or uncomfortable emotions in our daily lives. Each patient will be challenged to read some scenarios and discuss the unhealthy and healthy coping strategies within those scenarios. Also, each patient will be challenged to describe current healthy and unhealthy strategies that they use in their own lives and discuss the outcomes and barriers to those strategies. This group will be process-oriented, with patients participating in exploration of their own experiences as well as giving and receiving support and challenge from other group members.  Therapeutic Goals: 1. Patient will identify personal healthy and unhealthy coping strategies. 2. Patient will identify healthy and unhealthy coping strategies, in others, through scenarios.  3. Patient will identify expected outcomes of healthy and unhealthy coping strategies. 4. Patient will identify barriers to using healthy coping strategies.   Summary of Patient Progress: Patient attended and participate in group. Patient was negative during this group and would brush aside majority of coping skills and techniques offered. Pt did share that coffee was a coping skill for her and staying busy.   Therapeutic Modalities:  Cognitive Behavioral Therapy Solution Focused Therapy Motivational Interviewing    Ruthann Cancer MSW, LCSW Clincal Social Worker  [facility]

## 2021-01-27 NOTE — [facility] Suicide Risk Assessment (Signed)
[facility] INPATIENT:  Family/Significant Other Suicide Prevention Education  Suicide Prevention Education:  Patient Refusal for Family/Significant Other Suicide Prevention Education: The patient Lindsey Prince has refused to provide written consent for family/significant other to be provided Family/Significant Other Suicide Prevention Education during admission and/or prior to discharge.  Physician notified.    [name] 01/27/2021, 10:37 AM

## 2021-01-27 NOTE — Progress Notes (Signed)
Adult Psychoeducational Group Note  Date:  01/27/2021 Time:  1435 Group Topic/Focus:  Personal Choices and Values:   The focus of this group is to help patients assess and explore the importance of values in their lives, how their values affect their decisions, how they express their values and what opposes their expression.  Participation Level:  Minimal  Participation Quality:  Intrusive and Monopolizing  Affect:  Blunted and Irritable  Cognitive:  Disorganized and Confused  Insight: Lacking  Engagement in Group:  Distracting and Monopolizing  Modes of Intervention:  Discussion and Education  Additional Comments:  Pt was very intrusive and disorganized during the group.  Emonie Espericueta E 01/27/2021, 6:10 PM
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
## 2021-01-27 NOTE — Tx Team (Signed)
Interdisciplinary Treatment and Diagnostic Plan Update  01/27/2021 Time of Session: 9:20am  Lindsey Prince MRN: [id number]  Principal Diagnosis: Psychosis [facility])  Secondary Diagnoses: Principal Problem:   Psychosis ([place]) Active Problems:   PTSD (post-traumatic stress disorder)   Current Medications:  Current Facility-Administered Medications  Medication Dose Route Frequency Provider Last Rate Last Admin  . acetaminophen (TYLENOL) tablet 650 mg  650 mg Oral Q6H PRN Arthor Captain, MD   650 mg at 01/26/21 1534  . alum & mag hydroxide-simeth (MAALOX/MYLANTA) 200-200-20 MG/5ML suspension 30 mL  30 mL Oral Q4H PRN Ival Bible, MD      . hydrOXYzine (ATARAX/VISTARIL) tablet 25 mg  25 mg Oral TID PRN Ival Bible, MD   25 mg at 01/27/21 8588  . LORazepam (ATIVAN) tablet 1 mg  1 mg Oral Q6H PRN Sharma Covert, MD       And  . OLANZapine zydis (ZYPREXA) disintegrating tablet 10 mg  10 mg Oral Q8H PRN Sharma Covert, MD   10 mg at 01/27/21 5027   And  . ziprasidone (GEODON) injection 20 mg  20 mg Intramuscular Q6H PRN Sharma Covert, MD      . magnesium hydroxide (MILK OF MAGNESIA) suspension 30 mL  30 mL Oral Daily PRN Ival Bible, MD      . OLANZapine zydis (ZYPREXA) disintegrating tablet 10 mg  10 mg Oral QHS Lavella Hammock, MD       PTA Medications: Medications Prior to Admission  Medication Sig Dispense Refill Last Dose  . OLANZapine zydis (ZYPREXA) 5 MG disintegrating tablet Take 1 tablet (5 mg total) by mouth at bedtime.       Patient Stressors: Financial difficulties Health problems Marital or family conflict Occupational concerns  Patient Strengths: Ability for insight Capable of independent living Motivation for treatment/growth  Treatment Modalities: Medication Management, Group therapy, Case management,  1 to 1 session with clinician, Psychoeducation, Recreational therapy.   Physician Treatment Plan for Primary Diagnosis:  Psychosis ([place]) Long Term Goal(s): Improvement in symptoms so as ready for discharge   Short Term Goals: Ability to identify and develop effective coping behaviors will improve  Medication Management: Evaluate patient's response, side effects, and tolerance of medication regimen.  Therapeutic Interventions: 1 to 1 sessions, Unit Group sessions and Medication administration.  Evaluation of Outcomes: Not Met  Physician Treatment Plan for Secondary Diagnosis: Principal Problem:   Psychosis ([place]) Active Problems:   PTSD (post-traumatic stress disorder)  Long Term Goal(s): Improvement in symptoms so as ready for discharge   Short Term Goals: Ability to identify and develop effective coping behaviors will improve     Medication Management: Evaluate patient's response, side effects, and tolerance of medication regimen.  Therapeutic Interventions: 1 to 1 sessions, Unit Group sessions and Medication administration.  Evaluation of Outcomes: Not Met   RN Treatment Plan for Primary Diagnosis: Psychosis ([place]) Long Term Goal(s): Knowledge of disease and therapeutic regimen to maintain health will improve  Short Term Goals: Ability to remain free from injury will improve, Ability to demonstrate self-control, Ability to participate in decision making will improve, Ability to verbalize feelings will improve, Ability to disclose and discuss suicidal ideas and Ability to identify and develop effective coping behaviors will improve  Medication Management: RN will administer medications as ordered by provider, will assess and evaluate patient's response and provide education to patient for prescribed medication. RN will report any adverse and/or side effects to prescribing provider.  Therapeutic Interventions: 1  on 1 counseling sessions, Psychoeducation, Medication administration, Evaluate responses to treatment, Monitor vital signs and CBGs as ordered, Perform/monitor CIWA, COWS, AIMS and Fall Risk  screenings as ordered, Perform wound care treatments as ordered.  Evaluation of Outcomes: Not Met   LCSW Treatment Plan for Primary Diagnosis: Psychosis ([place]) Long Term Goal(s): Safe transition to appropriate next level of care at discharge, Engage patient in therapeutic group addressing interpersonal concerns.  Short Term Goals: Engage patient in aftercare planning with referrals and resources, Increase social support, Increase ability to appropriately verbalize feelings, Increase emotional regulation, Facilitate acceptance of mental health diagnosis and concerns, Identify triggers associated with mental health/substance abuse issues and Increase skills for wellness and recovery  Therapeutic Interventions: Assess for all discharge needs, 1 to 1 time with Social worker, Explore available resources and support systems, Assess for adequacy in community support network, Educate family and significant other(s) on suicide prevention, Complete Psychosocial Assessment, Interpersonal group therapy.  Evaluation of Outcomes: Not Met   Progress in Treatment: Attending groups: Yes. Participating in groups: Yes. Taking medication as prescribed: Yes. Toleration medication: Yes. Family/Significant other contact made: No, will contact:  [place]  Patient understands diagnosis: No. Discussing patient identified problems/goals with staff: Yes. Medical problems stabilized or resolved: Yes. Denies suicidal/homicidal ideation: Yes. Issues/concerns per patient self-inventory: No.   New problem(s) identified: No, Describe:  None   New Short Term/Long Term Goal(s): medication stabilization, elimination of SI thoughts, development of comprehensive mental wellness plan.   Patient Goals: Did not attend   Discharge Plan or Barriers: Patient recently admitted. CSW will continue to follow and assess for appropriate referrals and possible discharge planning.   Reason for Continuation of  Hospitalization: Anxiety Mania Medication stabilization  Estimated Length of Stay: 3 to 5 days   Attendees: Patient: Did not attend  01/27/2021   Physician: Viann Fish, MD 01/27/2021   Nursing:  01/27/2021   RN Care Manager: 01/27/2021   Social Worker: Verdis Frederickson, [place] 01/27/2021   Recreational Therapist:  01/27/2021   Other:  01/27/2021   Other:  01/27/2021   Other: 01/27/2021     Scribe for Treatment Team: Darleen Crocker, [place] 01/27/2021 2:04 PM

## 2021-01-27 NOTE — Progress Notes (Signed)
  D:  Patient presents with moderate anxiety and irritability.  Patient is somewhat irritated and preoccupied when asked about her day.  Patient observed sitting in dayroom, but no contact with peers.  Patient denies SI/HI and AVH.  A:  Labs/Vitals monitored; Medication education provided; Patient supported emotionally; Patient asked to communicate her needs, concerns, and questions.  R:  Patient remains safe with 15 minute checks.     01/27/21 2129  Psych Admission Type (Psych Patients Only)  Admission Status Voluntary  Psychosocial Assessment  Patient Complaints Anxiety  Eye Contact Brief  Facial Expression Anxious  Affect Anxious;Irritable;Labile  Speech Logical/coherent  Interaction Guarded;Minimal  Motor Activity Fidgety  Appearance/Hygiene Unremarkable  Behavior Characteristics Cooperative  Mood Anxious;Preoccupied  Thought Process  Coherency WDL  Content Blaming others;Paranoia  Delusions Paranoid  Perception WDL  Hallucination None reported or observed  Judgment Impaired  Confusion None  Danger to Self  Current suicidal ideation? Denies  Danger to Others  Danger to Others None reported or observed

## 2021-01-27 NOTE — Plan of Care (Signed)
  Problem: Education: Goal: Emotional status will improve Outcome: Not Progressing Goal: Mental status will improve Outcome: Not Progressing   Problem: Coping: Goal: Ability to identify and develop effective coping behavior will improve Outcome: Not Progressing

## 2021-01-27 NOTE — Progress Notes (Signed)
Recreation Therapy Notes  Date: 5.25.22 Time: 1000 Location: [address] Dayroom   Group Topic: Self-esteem  Goal Area(s) Addresses:  Patient will identify and write various positive traits about themselves. Patients will also identify things/activities that interest them. Patient will acknowledge the benefit of healthy self-esteem. Patient will endorse understanding of ways to increase self-esteem.   Behavioral Response: None   Intervention: Personalized Plate- printed license plate template, markers, colored pencils, music   Activity: LRT were given a blank outline of a license plate.  Patients were to design the plate anyway the chose to represent the things/activities they like and write positive traits about themselves.  Patients could use words or draw pictures to represent these things.  LRT also played music in the backgroup as patients worked on their individual license plates.   Education: LRT educated patients on the importance of healthy self-esteem and ways to build self-esteem. LRT addressed discharge planning reviewing positive coping skills and healthy support systems.  Education Outcome: Acknowledges education/In group clarification offered   Clinical Observations/Feedback: Pt came into group a little after it started.  LRT explained to pt what the group was about.  Pt stated she couldn't think of anything about herself and that she doesn't feel safe here.  When asked why, pt expressed people were being "wish washy" with her.  Pt expressed people were telling "I can love this person but I shouldn't like this person and it's just confusing".  When asked what she ment, pt didn't want to explain.  Pt seemed angry the entire time she was in group.  Pt was called of group by doctor and did not return.      Caroll Rancher, LRT/CTRS     Caroll Rancher A 01/27/2021 12:44 PM
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
## 2021-01-28 MED ORDER — OLANZAPINE 10 MG PO TBDP
10.0000 mg | ORAL_TABLET | Freq: Every day | ORAL | Status: DC
  Administered 2021-01-28 – 2021-01-31 (×4): 10 mg via ORAL
  Filled 2021-01-28 (×6): qty 1

## 2021-01-28 NOTE — Progress Notes (Signed)
Pt visible on the unit pt stated her day was up and down. Pt appeared with brighter affect .    01/28/21 2300  Psych Admission Type (Psych Patients Only)  Admission Status Voluntary  Psychosocial Assessment  Patient Complaints Anxiety  Eye Contact Brief  Facial Expression Angry;Anxious  Affect Angry;Anxious;Irritable  Speech Aggressive;Logical/coherent  Interaction Cautious;Forwards little;Guarded;Hostile;Minimal  Motor Activity Pacing  Appearance/Hygiene Unremarkable  Behavior Characteristics Anxious;Cooperative  Mood Labile;Pleasant  Thought Administrator, sports thinking  Content Blaming others;Paranoia  Delusions Paranoid  Perception WDL  Hallucination None reported or observed  Judgment Poor  Confusion None  Danger to Self  Current suicidal ideation? Denies  Danger to Others  Danger to Others None reported or observed

## 2021-01-28 NOTE — Plan of Care (Signed)
  Problem: Education: Goal: Knowledge of  General Education information/materials will improve Outcome: Progressing Goal: Verbalization of understanding the information provided will improve Outcome: Progressing   Problem: Self-Concept: Goal: Level of anxiety will decrease Outcome: Progressing

## 2021-01-28 NOTE — BHH Group Notes (Addendum)
Adult Psychoeducational Group Note  Date:  01/28/2021 Time:  3:07 AM  Group Topic/Focus:  Wrap-Up Group:   The focus of this group is to help patients review their daily goal of treatment and discuss progress on daily workbooks.  Participation Level:  Active  Participation Quality:  Appropriate and Attentive  Affect:  Blunted  Cognitive:  Alert and Appropriate  Insight: Appropriate and Good  Engagement in Group:  Engaged  Modes of Intervention:  Discussion and Education  Additional Comments:  Pt attended and participated in wrap up group this evening and rated their day a 6/10 due to them doing well with their goal. Pt goal was to remain focused and to maintain being calm. Tomorrow pt would like to work on learning not to let other's opinions bother them.   Chrisandra Netters 01/28/2021, 3:07 AM

## 2021-01-28 NOTE — Progress Notes (Signed)
Recreation Therapy Notes  Date: 5.26.22 Time: 1000 Location: 500 Hall Dayroom  Group Topic: Communication, Team Building, Problem Solving  Goal Area(s) Addresses:  Patient will effectively work with peer towards shared goal.  Patient will identify skills used to make activity successful.  Patient will identify how skills used during activity can be applied to reach post d/c goals.   Intervention: STEM Activity- Glass blower/designer  Activity: Tallest [employer]. In teams of 5-6, patients were given 15 craft pipe cleaners. Using the materials provided, patients were instructed to compete again the opposing team(s) to build the tallest free-standing structure from floor level. The activity was timed; difficulty increased by Clinical research associate as Production designer, theatre/television/film continued.  Systematically resources were removed with additional directions for example, placing one arm behind their back, working in silence, and shape stipulations. LRT facilitated post-activity discussion reviewing team processes and necessary communication skills involved in completion. Patients were encouraged to reflect how the skills utilized, or not utilized, in this activity can be incorporated to positively impact support systems post discharge.  Education: Pharmacist, community, Scientist, physiological, Discharge Planning   Education Outcome: Acknowledges education/In group clarification offered/Needs additional education.   Clinical Observations/Feedback: Despite invitation to group, pt did not attend.    Caroll Rancher, LRT/CTRS      Caroll Rancher A 01/28/2021 12:52 PM

## 2021-01-28 NOTE — Progress Notes (Signed)
Progress note    01/28/21 0815  Psych Admission Type (Psych Patients Only)  Admission Status Voluntary  Psychosocial Assessment  Patient Complaints Agitation;Anger;Anxiety  Eye Contact Brief  Facial Expression Angry;Anxious  Affect Angry;Anxious;Irritable  Speech Aggressive;Logical/coherent  Interaction Cautious;Forwards little;Guarded;Hostile;Minimal  Motor Activity Pacing  Appearance/Hygiene Unremarkable  Behavior Characteristics Cooperative;Agitated;Anxious;Guarded;Irritable  Mood Anxious;Labile;Suspicious;Irritable;Pleasant  Thought Administrator, sports thinking  Content Blaming others;Paranoia  Delusions Paranoid  Perception WDL  Hallucination None reported or observed  Judgment Poor  Confusion None  Danger to Self  Current suicidal ideation? Denies  Danger to Others  Danger to Others None reported or observed

## 2021-01-28 NOTE — B[facility] Group Notes (Signed)
Adult Psychoeducational Group Note  Date:  01/28/2021 Time:  11:20 PM  Group Topic/Focus:  Wrap-Up Group:   The focus of this group is to help patients review their daily goal of treatment and discuss progress on daily workbooks.  Participation Level:  Active  Participation Quality:  Appropriate and Attentive  Affect:  Appropriate  Cognitive:  Alert  Insight: Improving  Engagement in Group:  Engaged  Modes of Intervention:  Discussion  Additional Comments:    Jacalyn Lefevre 01/28/2021, 11:20 PM

## 2021-01-28 NOTE — B[facility] Group Notes (Signed)
Occupational Therapy Group Note Date: 01/28/2021 Group Topic/Focus: Feelings Management  Group Description: Group encouraged increased engagement and participation through discussion focused on Building Happiness. Patients were provided a handout and reviewed therapeutic strategies to build happiness including identifying gratitudes, random acts of kindness, exercise, meditation, positive journaling, and fostering relationships. Patients engaged in discussion and encouraged to reflect on each strategy and their experiences.  Therapeutic Goal(s): Identify strategies to build happiness. Identify and implement therapeutic strategies to improve overall mood. Practice and identify gratitudes, random acts of kindness, exercise, meditation, positive journaling, and fostering relationships Participation Level: Active   Participation Quality: Maximum Cues   Behavior: Paranoid, Perseverative/Ruminative and Threatening   Speech/Thought Process: Disorganized and Pressured   Affect/Mood: Full range and Labile   Insight: Impaired   Judgement: Impaired   Individualization: Mareesa presented as labile and paranoid, difficult to engage and redirect. Pt stood in the middle of group space for duration, speaking out in a disorganized manner about staff and "everyone listening to me". Pt was not able to participate meaningfully in group topic and monopolized group discussion.   Modes of Intervention: Discussion and Education  Patient Response to Interventions:  Challenging, Resistant and Skeptical   Plan: Continue to engage patient in OT groups 2 - 3x/week.  01/28/2021  Donne Hazel, MOT, OTR/L

## 2021-01-28 NOTE — Progress Notes (Signed)
[facility] MD Progress Note  01/28/2021 6:18 PM KATELINN Prince  MRN:  [id number]   Reason for admission: Lindsey Prince presented to [facility] ED via [facility] after her boyfriend called stating she was exhibiting paranoid behavior, covering up reflective objects in her house and he was concerned for her safety as well as the safety of their child. Agreed to voluntary commitment at [facility] to build support and gain coping skills for difficult life situation. Admitted to [facility] for crisis stabilization and medication management.   Objective: Patient seen this morning by medical student, Lindsey Prince and Dr. Fayrene Fearing. Nursing notes reviewed, it seems Lindsey Prince remained agitated yesterday and received 1x 10mg  Zyprexa as well as all three doses of PRN hydroxyzine 10mg  yesterday. She attended most groups but was disruptive or intrusive during some. Lindsey Prince was in her room with the door shut prior to our interview. Overall she is more cooperative, less irritable today but remains guarded throughout the interview. Her sleep and appetite remain good. She describes her mood as "sporadic" with highs and lows today.  The patient makes paranoid statements about peers and states her belief peers are trying to "antagonize" her.  She indicates her belief that peers may be conspiring with the father of her daughter to target patient because the father of her daughter wants custody of her daughter. She denies AH, VH, AI, HI or SI.  She remains guarded, anxious, fearful about her safety on the unit amongst peers and hesitates at times to answer questions due to paranoid concerns. Thought content is more organized affect is less labile today.  Plan today is to add a morning dose of Zyprexa 10mg  to increase overall dosage for mood and paranoia stabilization. Lindsey Prince is very amenable and agreeable to this today, more so than previous days. Encouraged her to seek support from staff when she feels unsafe. Vital signs reviewed: Blood pressure 116/77, pulse 97, temperature 97.6 F  (36.4 C), temperature source Oral, resp. rate 16, height 5\' 2"  (1.575 m), weight 68 kg, SpO2 100 %.   Principal Problem: Psychosis (HCC) Diagnosis: Principal Problem:   Psychosis (HCC) Active Problems:   PTSD (post-traumatic stress disorder)   Bipolar 1 disorder, depressed, severe (HCC)  Total Time spent with patient: 25 minutes  Past Psychiatric History: see admission H&P  Past Medical History:  Past Medical History:  Diagnosis Date  . Anemia   . Herpes     Past Surgical History:  Procedure Laterality Date  . NO PAST SURGERIES     Family History:  Family History  Problem Relation Age of Onset  . Healthy Neg Hx    Family Psychiatric  History: see admission H&P   Social History:  Social History   Substance and Sexual Activity  Alcohol Use No     Social History   Substance and Sexual Activity  Drug Use Yes  . Types: Marijuana    Social History   Socioeconomic History  . Marital status: Single    Spouse name: Not on file  . Number of children: Not on file  . Years of education: Not on file  . Highest education level: Not on file  Occupational History  . Not on file  Tobacco Use  . Smoking status: Current Every Day Smoker    Packs/day: 1.50    Types: Cigarettes  . Smokeless tobacco: Never Used  Substance and Sexual Activity  . Alcohol use: No  . Drug use: Yes    Types: Marijuana  . Sexual activity: Not Currently  Other Topics Concern  . Not on file  Social History Narrative  . Not on file   Social Determinants of Health   Financial Resource Strain: Not on file  Food Insecurity: Not on file  Transportation Needs: Not on file  Physical Activity: Not on file  Stress: Not on file  Social Connections: Not on file   Additional Social History:           Has been living with her significant other and their 42-year-old child.  Patient feels that she should not return home.  She does not feel that her daughter is in danger, and believes her  daughter would be better off living with her husband while she works on her mental health.              Sleep: Good  Appetite:  Good  Current Medications: Current Facility-Administered Medications  Medication Dose Route Frequency Provider Last Rate Last Admin  . acetaminophen (TYLENOL) tablet 650 mg  650 mg Oral Q6H PRN Claudie Revering, MD   650 mg at 01/28/21 0806  . alum & mag hydroxide-simeth (MAALOX/MYLANTA) 200-200-20 MG/5ML suspension 30 mL  30 mL Oral Q4H PRN Estella Husk, MD      . hydrOXYzine (ATARAX/VISTARIL) tablet 25 mg  25 mg Oral TID PRN Estella Husk, MD   25 mg at 01/28/21 0805  . LORazepam (ATIVAN) tablet 1 mg  1 mg Oral Q6H PRN Antonieta Pert, MD   1 mg at 01/28/21 1444   And  . OLANZapine zydis (ZYPREXA) disintegrating tablet 10 mg  10 mg Oral Q8H PRN Antonieta Pert, MD   10 mg at 01/28/21 1304   And  . ziprasidone (GEODON) injection 20 mg  20 mg Intramuscular Q6H PRN Antonieta Pert, MD      . magnesium hydroxide (MILK OF MAGNESIA) suspension 30 mL  30 mL Oral Daily PRN Estella Husk, MD      . OLANZapine zydis (ZYPREXA) disintegrating tablet 10 mg  10 mg Oral QHS Mariel Craft, MD   10 mg at 01/27/21 2129  . OLANZapine zydis (ZYPREXA) disintegrating tablet 10 mg  10 mg Oral Daily Claudie Revering, MD   10 mg at 01/28/21 1116    Lab Results:  No results found for this or any previous visit (from the past 48 hour(s)).  Blood Alcohol level:  Lab Results  Component Value Date   ETH <10 01/24/2021   ETH <10 06/14/2018    Metabolic Disorder Labs: Lab Results  Component Value Date   HGBA1C 5.4 01/24/2021   MPG 108.28 01/24/2021   No results found for: PROLACTIN Lab Results  Component Value Date   CHOL 145 01/24/2021   TRIG 125 01/24/2021   HDL 73 01/24/2021   CHOLHDL 2.0 01/24/2021   VLDL 25 01/24/2021   LDLCALC 47 01/24/2021    Physical Findings: AIMS: Facial and Oral Movements Muscles of Facial Expression:  None, normal Lips and Perioral Area: None, normal Jaw: None, normal Tongue: None, normal,Extremity Movements Upper (arms, wrists, hands, fingers): None, normal Lower (legs, knees, ankles, toes): None, normal, Trunk Movements Neck, shoulders, hips: None, normal, Overall Severity Severity of abnormal movements (highest score from questions above): None, normal Incapacitation due to abnormal movements: None, normal Patient's awareness of abnormal movements (rate only patient's report): No Awareness, Dental Status Current problems with teeth and/or dentures?: No Does patient usually wear dentures?: No  CIWA:    COWS:  Psychiatric Specialty Exam:  Presentation  General Appearance: Appropriate for Environment; Fairly Groomed (Guarded)  Eye Contact:Fair  Speech:Normal Rate; Other (comment) (Some speech latency at times.)  Speech Volume:Normal  Handedness:Right   Mood and Affect  Mood:Anxious; Dysphoric  Affect:Congruent   Thought Process  Thought Processes:Coherent  Descriptions of Associations:Tangential  Orientation:Full (Time, Place and Person)  Thought Content:Paranoid Ideation; Delusions  History of Schizophrenia/Schizoaffective disorder:No  Duration of Psychotic Symptoms:Greater than six months  Hallucinations:Hallucinations: None  Ideas of Reference:Paranoia; Delusions; Percusatory  Suicidal Thoughts:Suicidal Thoughts: No  Homicidal Thoughts:Homicidal Thoughts: No   Sensorium  Memory:Immediate Fair; Recent Fair; Remote Fair  Judgment:Fair  Insight:Shallow   Executive Functions  Concentration:Fair  Attention Span:Fair  Recall:Fair  Fund of Knowledge:Fair  Language:Fair   Psychomotor Activity  Psychomotor Activity:Psychomotor Activity: Normal   Assets  Assets:Desire for Improvement; Resilience   Sleep  Sleep:Sleep: Good Number of Hours of Sleep: 7.5    Physical Exam: Physical Exam Vitals and nursing note reviewed.   Constitutional:      Appearance: Normal appearance.  HENT:     Head: Normocephalic.  Cardiovascular:     Rate and Rhythm: Normal rate.  Pulmonary:     Effort: Pulmonary effort is normal.  Neurological:     Mental Status: She is alert and oriented to person, place, and time.    Review of Systems  Neurological: Negative for dizziness.  Psychiatric/Behavioral: Negative for hallucinations and suicidal ideas. The patient is nervous/anxious. The patient does not have insomnia.   All other systems reviewed and are negative.  Blood pressure 116/77, pulse 97, temperature 97.6 F (36.4 C), temperature source Oral, resp. rate 16, height 5\' 2"  (1.575 m), weight 68 kg, SpO2 100 %. Body mass index is 27.44 kg/m.   Treatment Plan Summary: Continue every 15-minute observation status  Encouraged participation in group therapy and therapeutic milieu  Psychosis - Increase Zyprexa to 10mg  every morning and 10 mg at bedtime for psychotic symptoms and mood stabilization  Anxiety -Continue Vistaril 25 mg 3 times daily PRN  Back pain, moderate - Continue Tylenol 650mg  q6h PRN  Disposition planning in progress  , Medical Student , MD  I attest that I saw and interviewed the patient and that I performed or reperformed the mental status examination of the patient.  I reviewed the medical record.  I have formulated the assessment and plan of treatment.  I am in agreement with the exam, assessment and plan as documented above.  , MD 01/28/2021, 6:18 PM

## 2021-01-29 MED ORDER — DIVALPROEX SODIUM 500 MG PO DR TAB
500.0000 mg | DELAYED_RELEASE_TABLET | ORAL | Status: AC
  Administered 2021-01-29: 500 mg via ORAL
  Filled 2021-01-29 (×2): qty 1

## 2021-01-29 MED ORDER — DIVALPROEX SODIUM 500 MG PO DR TAB
500.0000 mg | DELAYED_RELEASE_TABLET | Freq: Two times a day (BID) | ORAL | Status: DC
  Administered 2021-01-29 – 2021-02-01 (×6): 500 mg via ORAL
  Filled 2021-01-29 (×10): qty 1

## 2021-01-29 NOTE — Progress Notes (Signed)
[facility] MD Progress Note  01/29/2021 5:30 PM Lindsey Prince  MRN:  [id number]   Reason for admission: [facility] presented to [facility] ED via [employer] after her boyfriend called stating she was exhibiting paranoid behavior, covering up reflective objects in her house and he was concerned for her safety as well as the safety of their child. Agreed to voluntary commitment at [facility] to build support and gain coping skills for difficult life situation. Admitted to [facility] for crisis stabilization and medication management.   Objective: Patient seen this morning by medical student, Kathrynn Ducking and Dr. Fayrene Fearing. Nursing notes reviewed, it seems Jenavi remained agitated yesterday and requested PRN Zyprexa 10mg  and Ativan 1mg  in the afternoon. Ceria was in the dayroom prior to our conversation. She states her mood is depressed & scared today; scared that her daughter's father will talk her into getting back together with him by using her daughter to "pull her back in" even though she doesn't want to be with him. She says her mood has been very labile with "no medium, all good or all bad". She rarely makes eye contact and is labile with frequent tearfulness throughout the interview. She states her concentration is somewhat improving, especially if she can talk about her problems, but during the interview she lost her train of thought when asked a question and became agitated by this. She still endorses paranoia that patients and staff are talking about her, that she is unsafe, and that there are "imposters" somehow connected to the father. She states she is getting "subliminal messages" that people are working with him. She endorses some swelling and back pain but denies medication side effects and does not think these are due to medication. She raised a concern about her genital herpes towards the end of the visit and quickly got agitated and concerned that people are treating her differently because they know she has herpes. We were unable to  clarify this morning if she's currently having a flare and/or if she would like antiviral medication. Will check back in this afternoon and tomorrow to clarify. Nursing notes reviewed and indicate ongoing agitation throughout the day and group attendance with variable success and participation. Vital signs reviewed: Blood pressure 123/81, pulse 96, temperature 97.6 F (36.4 C), temperature source Oral, resp. rate 16, height 5\' 2"  (1.575 m), weight 68 kg, SpO2 100 %.   Principal Problem: Psychosis (HCC) Diagnosis: Principal Problem:   Psychosis (HCC) Active Problems:   PTSD (post-traumatic stress disorder)   Bipolar 1 disorder, depressed, severe (HCC)  Total Time spent with patient: 25 minutes  Past Psychiatric History: see admission H&P  Past Medical History:  Past Medical History:  Diagnosis Date  . Anemia   . Herpes     Past Surgical History:  Procedure Laterality Date  . NO PAST SURGERIES     Family History:  Family History  Problem Relation Age of Onset  . Healthy Neg Hx    Family Psychiatric  History: see admission H&P   Social History:  Social History   Substance and Sexual Activity  Alcohol Use No     Social History   Substance and Sexual Activity  Drug Use Yes  . Types: Marijuana    Social History   Socioeconomic History  . Marital status: Single    Spouse name: Not on file  . Number of children: Not on file  . Years of education: Not on file  . Highest education level: Not on file  Occupational History  .  Not on file  Tobacco Use  . Smoking status: Current Every Day Smoker    Packs/day: 1.50    Types: Cigarettes  . Smokeless tobacco: Never Used  Substance and Sexual Activity  . Alcohol use: No  . Drug use: Yes    Types: Marijuana  . Sexual activity: Not Currently  Other Topics Concern  . Not on file  Social History Narrative  . Not on file   Social Determinants of Health   Financial Resource Strain: Not on file  Food Insecurity: Not on  file  Transportation Needs: Not on file  Physical Activity: Not on file  Stress: Not on file  Social Connections: Not on file   Additional Social History:           Has been living with her significant other and their 62-year-old child.  Patient feels that she should not return home.  She does not feel that her daughter is in danger, and believes her daughter would be better off living with her husband while she works on her mental health.              Sleep: Good  Appetite:  Good  Current Medications: Current Facility-Administered Medications  Medication Dose Route Frequency Provider Last Rate Last Admin  . acetaminophen (TYLENOL) tablet 650 mg  650 mg Oral Q6H PRN Claudie Revering, MD   650 mg at 01/28/21 0806  . alum & mag hydroxide-simeth (MAALOX/MYLANTA) 200-200-20 MG/5ML suspension 30 mL  30 mL Oral Q4H PRN Estella Husk, MD      . divalproex (DEPAKOTE) DR tablet 500 mg  500 mg Oral Q12H Claudie Revering, MD      . hydrOXYzine (ATARAX/VISTARIL) tablet 25 mg  25 mg Oral TID PRN Estella Husk, MD   25 mg at 01/29/21 0649  . LORazepam (ATIVAN) tablet 1 mg  1 mg Oral Q6H PRN Antonieta Pert, MD   1 mg at 01/29/21 1135   And  . OLANZapine zydis (ZYPREXA) disintegrating tablet 10 mg  10 mg Oral Q8H PRN Antonieta Pert, MD   10 mg at 01/29/21 1042   And  . ziprasidone (GEODON) injection 20 mg  20 mg Intramuscular Q6H PRN Antonieta Pert, MD      . magnesium hydroxide (MILK OF MAGNESIA) suspension 30 mL  30 mL Oral Daily PRN Estella Husk, MD      . OLANZapine zydis (ZYPREXA) disintegrating tablet 10 mg  10 mg Oral QHS Mariel Craft, MD   10 mg at 01/28/21 2112  . OLANZapine zydis (ZYPREXA) disintegrating tablet 10 mg  10 mg Oral Daily Claudie Revering, MD   10 mg at 01/29/21 6659    Lab Results:  No results found for this or any previous visit (from the past 48 hour(s)).  Blood Alcohol level:  Lab Results  Component Value Date   ETH <10  01/24/2021   ETH <10 06/14/2018    Metabolic Disorder Labs: Lab Results  Component Value Date   HGBA1C 5.4 01/24/2021   MPG 108.28 01/24/2021   No results found for: PROLACTIN Lab Results  Component Value Date   CHOL 145 01/24/2021   TRIG 125 01/24/2021   HDL 73 01/24/2021   CHOLHDL 2.0 01/24/2021   VLDL 25 01/24/2021   LDLCALC 47 01/24/2021    Physical Findings: AIMS: Facial and Oral Movements Muscles of Facial Expression: None, normal Lips and Perioral Area: None, normal Jaw: None, normal Tongue: None, normal,Extremity Movements  Upper (arms, wrists, hands, fingers): None, normal Lower (legs, knees, ankles, toes): None, normal, Trunk Movements Neck, shoulders, hips: None, normal, Overall Severity Severity of abnormal movements (highest score from questions above): None, normal Incapacitation due to abnormal movements: None, normal Patient's awareness of abnormal movements (rate only patient's report): No Awareness, Dental Status Current problems with teeth and/or dentures?: No Does patient usually wear dentures?: No  CIWA:    COWS:      Psychiatric Specialty Exam:  Presentation  General Appearance: Appropriate for Environment; Fairly Groomed  Eye Contact:Fleeting  Speech:Normal Rate; Other (comment) (Some intermittent latency of speech)  Speech Volume:Normal  Handedness:Right   Mood and Affect  Mood:Anxious; Depressed; Irritable; Labile  Affect:Labile; Tearful   Thought Process  Thought Processes:Coherent  Descriptions of Associations:Tangential  Orientation:Full (Time, Place and Person)  Thought Content:Paranoid Ideation; Tangential; Delusions (Voices concerns about impostors and receiving subliminal messages)  History of Schizophrenia/Schizoaffective disorder:No  Duration of Psychotic Symptoms:Less than six months  Hallucinations:Hallucinations: None  Ideas of Reference:Paranoia; Other (comment) (States concerns about being subjected to  subliminal messages and about impostors)  Suicidal Thoughts:Suicidal Thoughts: No  Homicidal Thoughts:Homicidal Thoughts: No   Sensorium  Memory:Immediate Fair; Recent Fair; Remote Fair  Judgment:Impaired  Insight:Shallow   Executive Functions  Concentration:Fair  Attention Span:Fair  Recall:Fair  Fund of Knowledge:Fair  Language:Fair   Psychomotor Activity  Psychomotor Activity:Psychomotor Activity: Normal   Assets  Assets:Desire for Improvement; Resilience   Sleep  Sleep:Sleep: Good Number of Hours of Sleep: 7    Physical Exam: Physical Exam Vitals and nursing note reviewed.  Constitutional:      Appearance: Normal appearance.  HENT:     Head: Normocephalic.  Pulmonary:     Effort: Pulmonary effort is normal.  Neurological:     Mental Status: She is alert and oriented to person, place, and time.    Review of Systems  Neurological: Negative for dizziness.  Psychiatric/Behavioral: Negative for hallucinations and suicidal ideas. The patient is nervous/anxious. The patient does not have insomnia.   All other systems reviewed and are negative.  Blood pressure 123/81, pulse 96, temperature 97.6 F (36.4 C), temperature source Oral, resp. rate 16, height 5\' 2"  (1.575 m), weight 68 kg, SpO2 100 %. Body mass index is 27.44 kg/m.   Treatment Plan Summary: Continue every 15-minute observation status  Encouraged participation in group therapy and therapeutic milieu  Bipolar disorder, current episode mixed with psychotic features - Continue Zyprexa to 10mg  every morning and 10 mg at bedtime for psychotic symptoms and mood stabilization - Start Depakote 500mg  Q12H for mood stabilization  - Continue PRN Zyprexa, Ativan, Geodon for breakthrough agitation  Anxiety - Continue Vistaril 25 mg 3 times daily PRN  Back pain, moderate - Continue Tylenol 650mg  q6h PRN  Disposition planning in progress   , Medical Student ,  MD  I attest that I saw and interviewed the patient and that I performed or reperformed the mental status examination of the patient.  I reviewed the medical record.  I have formulated the assessment and plan of treatment.  I am in agreement with the exam, assessment and plan as documented above.  , MD 01/29/2021, 5:30 PM

## 2021-01-29 NOTE — Progress Notes (Signed)
Recreation Therapy Notes  Date: 5.27.22 Time: 1005 Location: 500 Hall Dayroom  Group Topic: Anxiety  Goal Area(s) Addresses:  Patient will identify what anxiety is. Patient will identify what can cause anxiety. Patient will identify ways to cope with anxiety post d/c.  Intervention: Worksheet    Activity: Introduction to Anxiety.  Patients were to identify things that trigger their anxiety and they physical symptoms of anxiety.  Patients were to also thoughts that occur when anxious and ways to cope with anxiety.  Education:  Anxiety, Discharge Planning  Education Outcome: Acknowledges understanding/In group clarification offered/Needs additional education.   Clinical Observations/Feedback: Pt did not attend group.     Lindsey Prince, LRT/CTRS         Torris House A 01/29/2021 11:40 AM 

## 2021-01-29 NOTE — Progress Notes (Signed)
Patient has been up in the dayroom off and on briefly tonight. She did have a conversation with her baby's father and became upset with him and hung up. She became even more upset and agitated while in the dayroom and Clinical research associate gave her hs medications early to help her to start calming down. She later came back and apologized for her behavior. She reports being concerned that information was left on her bed from a counselor for alcohol and drugs and she reports that she doesn't use drugs. Her results are negative for drugs in epic. She wants help for her and her daughter but doesn't want to go to a facility for women with drug and alcohol abuse. She eventually calmed down and has been pleasant on the unit.

## 2021-01-29 NOTE — Progress Notes (Signed)
Progress note    01/29/21 0828  Psych Admission Type (Psych Patients Only)  Admission Status Voluntary  Psychosocial Assessment  Patient Complaints Agitation;Anxiety  Eye Contact Fair  Facial Expression Animated;Anxious  Affect Anxious;Irritable  Speech Aggressive;Argumentative;Logical/coherent  Interaction Assertive  Motor Activity Slow  Appearance/Hygiene Unremarkable  Behavior Characteristics Cooperative;Appropriate to situation  Mood Anxious;Suspicious;Pleasant  Thought Administrator, sports thinking  Content Blaming others;Paranoia  Delusions Paranoid;Persecutory  Perception Derealization  Hallucination None reported or observed  Judgment Poor  Confusion None  Danger to Self  Current suicidal ideation? Denies  Danger to Others  Danger to Others None reported or observed

## 2021-01-29 NOTE — Plan of Care (Signed)
  Problem: Self-Concept: Goal: Ability to identify factors that promote anxiety will improve Outcome: Progressing Goal: Ability to modify response to factors that promote anxiety will improve Outcome: Progressing   Problem: Education: Goal: Knowledge of the prescribed therapeutic regimen will improve Outcome: Progressing

## 2021-01-29 NOTE — [facility] Group Notes (Signed)
Type of Therapy and Topic: Group Therapy: Gratitude  Description of Group: The purpose behind this group is to get people thinking about things for which  they can be grateful. If continued over time, they might begin to spontaneously look for things and  situations for which to be grateful. Gratitude is related to a "wide variety of forms of wellbeing , whereas "negative attributions" can adversely affect relationships.  Several studies have shown that interventions to  increase gratitude can impact areas such as overall life satisfaction, decreased negative affect, increased happiness, the ability to provide emotional support to others, and decreased worrying.   Therapeutic Goals: 1. Patient will learn activities that focus on gratitude in their daily lives. 2. Patient will share gratitude in their daily lives. 3. Patient will learn to develop healthy habits and positive thinking techniques. 4. Patient will receive support and feedback from others  Therapeutic Modalities: Cognitive Behavioral Therapy Solution Focused Therapy Motivational Interviewing   

## 2021-01-29 NOTE — [facility] Group Notes (Signed)
SPIRITUALITY GROUP NOTE   Spirituality group facilitated by Kathleen Argue, BCC.   Group Description: Group focused on topic of hope. Patients participated in facilitated discussion around topic, connecting with one another around experiences and definitions for hope. Group members engaged with visual explorer photos, reflecting on what hope looks like for them today. Group engaged in discussion around how their definitions of hope are present today in hospital.   Modalities: Psycho-social ed, Adlerian, Narrative, MI   Patient Progress:  Lindsey Prince was in the room as group began, but when she heard the topic, she asked to go to her room.  Chaplain Dyanne Carrel, Bcc Pager, [phone] 3:41 PM

## 2021-01-30 DIAGNOSIS — F319 Bipolar disorder, unspecified: Secondary | ICD-10-CM

## 2021-01-30 DIAGNOSIS — F4310 Post-traumatic stress disorder, unspecified: Secondary | ICD-10-CM | POA: Diagnosis not present

## 2021-01-30 DIAGNOSIS — F29 Unspecified psychosis not due to a substance or known physiological condition: Secondary | ICD-10-CM | POA: Diagnosis not present

## 2021-01-30 NOTE — [facility] Group Notes (Signed)
  [facility] LCSW Group Therapy Note  Date/Time:  01/30/2021 11:15AM-12:00PM  Type of Therapy and Topic:  Group Therapy:  Feelings About Hospitalization  Participation Level:  Active   Description of Group This process group involved patients discussing their feelings related to being hospitalized, as well as the benefits they see to being in the hospital.  These feelings and benefits were itemized.  The group then brainstormed specific ways in which they could seek those same benefits when they discharge and return home.  Therapeutic Goals Patient will identify and describe positive and negative feelings related to hospitalization Patient will verbalize benefits of hospitalization to themselves personally Patients will brainstorm together ways they can obtain similar benefits in the outpatient setting, identify barriers to wellness and possible solutions  Summary of Patient Progress:  The patient expressed her primary feelings about being hospitalized are "neutral," good because she is learning coping techniques and bad because she has trust issues and there are circumstances outside that are still going on.  She left group early and did not return.  Therapeutic Modalities Cognitive Behavioral Therapy Motivational Interviewing    Ambrose Mantle, LCSW 01/30/2021, 9:57 AM

## 2021-01-30 NOTE — Progress Notes (Signed)
Patient has been very calm tonight. She has been up in the dayroom watching tv but little interaction with peers. She was compliant with her hs medications.

## 2021-01-30 NOTE — Progress Notes (Addendum)
D- Patient alert and oriented. Patient affect/mood reported irritable. Denies SI, HI, AVH, and pain. Patient must be redirected and encouraged to make efforts to get along with other patients and staff members.   A- Scheduled medications administered to patient, per MD orders. Support and encouragement provided.  Routine safety checks conducted every 15 minutes.  Patient informed to notify staff with problems or concerns.  R- No adverse drug reactions noted. Patient contracts for safety at this time. Patient compliant with medications and treatment plan. Patient receptive, and cooperative with redirection. Patient remains safe at this time.            [place] NOVEL CORONAVIRUS (COVID-19) DAILY CHECK-OFF SYMPTOMS - answer yes or no to each - every day NO YES  Have you had a fever in the past 24 hours?   Fever (Temp > 37.80C / 100F) X    Have you had any of these symptoms in the past 24 hours?  New Cough   Sore Throat    Shortness of Breath   Difficulty Breathing   Unexplained Body Aches   X    Have you had any one of these symptoms in the past 24 hours not related to allergies?    Runny Nose   Nasal Congestion   Sneezing   X    If you have had runny nose, nasal congestion, sneezing in the past 24 hours, has it worsened?   X    EXPOSURES - check yes or no X    Have you traveled outside the state in the past 14 days?   X    Have you been in contact with someone with a confirmed diagnosis of COVID-19 or PUI in the past 14 days without wearing appropriate PPE?   X    Have you been living in the same home as a person with confirmed diagnosis of COVID-19 or a PUI (household contact)?     X    Have you been diagnosed with COVID-19?     X                                                                                                                             What to do next: Answered NO to all: Answered YES to anything:    Proceed with unit schedule Follow the [facility]  Inpatient Flowsheet.
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
## 2021-01-30 NOTE — Progress Notes (Signed)
Adult Psychoeducational Group Note  Date:  01/30/2021 Time:  12:20 AM  Group Topic/Focus:  Wrap-Up Group:   The focus of this group is to help patients review their daily goal of treatment and discuss progress on daily workbooks.  Participation Level:  Minimal  Participation Quality:  Appropriate  Affect:  Anxious, Defensive and Irritable  Cognitive:  Disorganized and Confused  Insight: Lacking and Limited  Engagement in Group:  Lacking, Limited, Off Topic and Poor  Modes of Intervention:  Discussion  Additional Comments: Pt stated her goal for today was to focus on her treatment plan. Pt stated she accomplished her goal today. Pt stated she talk with her doctor but did not get a chance to talk with her social worker about her care today. Pt rated her overall day a 4 out of 10. Pt stated she think she called her daughter today and the father of her child but could not remember. Pt stated she felt better about herself tonight. Pt stated she was brought back all meals because she is on unit restriction. Pt stated she took all medications provided today. Pt stated her appetite was good today. Pt rated sleep last night as fair. Pt stated the goal tonight was to get some rest. Pt stated she had no physical pain today. Pt deny visual hallucinations and auditory issues tonight. Pt denies thoughts of harming herself or others. Pt stated she would alert staff if anything changed.     [name] 01/30/2021, 12:20 AM

## 2021-01-30 NOTE — Progress Notes (Signed)
Patient stated that the Zyprexa causes SOB "after working out in my room". Patient said that she read the side effects of the medication and that SOB was a side effect. Staff did not observe any SOB after the patient was administered this medication. Staff will continue to monitor patient for changes in behavior/condition.

## 2021-01-30 NOTE — Progress Notes (Signed)
   01/30/21 2115  COVID-19 Daily Checkoff  Have you had a fever (temp > 37.80C/100F)  in the past 24 hours?  No  If you have had runny nose, nasal congestion, sneezing in the past 24 hours, has it worsened? No  COVID-19 EXPOSURE  Have you traveled outside the state in the past 14 days? No  Have you been in contact with someone with a confirmed diagnosis of COVID-19 or PUI in the past 14 days without wearing appropriate PPE? No  Have you been living in the same home as a person with confirmed diagnosis of COVID-19 or a PUI (household contact)? No  Have you been diagnosed with COVID-19? No

## 2021-01-30 NOTE — Progress Notes (Signed)
Patient was very agitated this morning towards other patients, she stated that they were mean to her and that she feels as if its unfair to have to isolate herself in her room. Upon further inquiry, no other patient was speaking to her. Patient starting raising her voice and pointing her fingers which lead to being verbally re-directed out of the dayroom. Patient refused the Zyprexa PRN but agreed to take the Geodon injection as ordered.  Staff will continue to monitor for changes in her behavior and condition.

## 2021-01-30 NOTE — Progress Notes (Signed)
Adult Psychoeducational Group Note  Date:  01/30/2021 Time:  10:43 PM  Group Topic/Focus:  Wrap-Up Group:   The focus of this group is to help patients review their daily goal of treatment and discuss progress on daily workbooks.  Participation Level:  Minimal  Participation Quality:  Appropriate  Affect:  Anxious  Cognitive:  Appropriate  Insight: Appropriate  Engagement in Group:  Limited  Modes of Intervention:  Discussion  Additional Comments:   Pt stated her goal for today was to focus on her treatment plan. Pt stated she accomplished her goal today. Pt stated she talk with her doctor but did not get a chance to talk with her social worker about her care today. Pt rated her overall day a 7 out of 10. Pt stated she was able to contact her daughter today which improved her day. Pt stated she felt better about herself tonight. Pt stated she was brought back all meals because she is on unit restriction. Pt stated she took all medications provided today. Pt stated her appetite was good today. Pt rated sleep last night as poor. Pt stated the goal tonight was to get some rest. Pt stated she had some physical pain today. Pt stated she had some moderate pain in her back tonight. PT rate the moderate pain in her back a 5 on the pain level scale. Pt nurse was updated on the situation. Pt deny visual hallucinations and auditory issues tonight. Pt denies thoughts of harming herself or others. Pt stated she would alert staff if anything changed.   Lindsey Prince 01/30/2021, 10:43 PM

## 2021-01-31 DIAGNOSIS — F29 Unspecified psychosis not due to a substance or known physiological condition: Secondary | ICD-10-CM | POA: Diagnosis not present

## 2021-01-31 DIAGNOSIS — F314 Bipolar disorder, current episode depressed, severe, without psychotic features: Secondary | ICD-10-CM | POA: Diagnosis not present

## 2021-01-31 DIAGNOSIS — F4310 Post-traumatic stress disorder, unspecified: Secondary | ICD-10-CM | POA: Diagnosis not present

## 2021-01-31 MED ORDER — DIPHENHYDRAMINE HCL 50 MG PO CAPS
50.0000 mg | ORAL_CAPSULE | Freq: Once | ORAL | Status: AC
  Administered 2021-01-31: 50 mg via ORAL
  Filled 2021-01-31: qty 1
  Filled 2021-01-31: qty 2

## 2021-01-31 MED ORDER — DOCUSATE SODIUM 100 MG PO CAPS
100.0000 mg | ORAL_CAPSULE | Freq: Every day | ORAL | Status: DC
  Administered 2021-01-31 – 2021-02-05 (×6): 100 mg via ORAL
  Filled 2021-01-31 (×6): qty 1
  Filled 2021-01-31: qty 7
  Filled 2021-01-31 (×3): qty 1

## 2021-01-31 MED ORDER — ARIPIPRAZOLE 10 MG PO TABS
10.0000 mg | ORAL_TABLET | Freq: Every day | ORAL | Status: DC
  Administered 2021-02-01: 10 mg via ORAL
  Filled 2021-01-31 (×3): qty 1

## 2021-01-31 MED ORDER — CLONAZEPAM 0.5 MG PO TABS
0.5000 mg | ORAL_TABLET | Freq: Two times a day (BID) | ORAL | Status: DC
  Administered 2021-01-31 – 2021-02-01 (×3): 0.5 mg via ORAL
  Filled 2021-01-31 (×3): qty 1

## 2021-01-31 MED ORDER — CLONAZEPAM 0.5 MG PO TABS: 0.5000 mg | ORAL_TABLET | Freq: Two times a day (BID) | ORAL | Status: DC

## 2021-01-31 NOTE — Progress Notes (Signed)
[facility] MD Progress Note  01/31/2021 11:52 AM Lindsey Prince  MRN:  [id number]   Reason for admission: Lindsey Prince presented to [facility] ED via [employer] after her boyfriend called stating she was exhibiting paranoid behavior, covering up reflective objects in her house and he was concerned for her safety as well as the safety of their child. Agreed to voluntary commitment at [facility] to build support and gain coping skills for difficult life situation. Admitted to [facility] for crisis stabilization and medication management.   Objective: Today's assessment:  Records reviewed and Nursing report received and plan reviewed with interdisciplinary team members.  Patient was seen in the providers one this morning for daily evaluation.  Her speech was pressured, rapid and she was tearful.  Patient remains very suspicious of staff and other patients.  She complained of staff being too friendly with other patients and talking about her.  She constantly bursted out crying that nobody cares about her.  She complained of Olanzapine causing SOB and this morning she showed provider and her Nursing staff rash on her fore head and both arms and her abdomen.  She does not believe it started from lotion or soap because she has been using the same soap and lotion.  Patient stated that she strongly believes it started after Geodon injection yesterday morning.  She was seen a second time this morning by provider for complaint of Herpes in her nose and tingling in her Vagina..  Patient was tearful again and requested extra dose of Valtrex because she received three days of Valtrex and believes that is not enough.  Writer explained and reassured patient that her three doses last week is enough for now and once she is stabilized mentally she can follow up with her PCP for further investigation.  We will obtain a UA this evening..  She denied feeling suicidal or homicidal.  Patient denied AVH but remained paranoid and suspicious of staff and other patients in the  unit.  Review of her old record shows patient tolerated Abilify in 2019 and same is ordered today and Geodon and Olanzapine discontinued. Clonazepam 0.5 mg po x 6 doses prescribed to manage her anxiety.  She walks in and out of group therapy and does not participate.   Blood pressure 108/80, pulse (!) 114, temperature (!) 97.4 F (36.3 C), temperature source Oral, resp. rate 16, height 5\' 2"  (1.575 m), weight 68 kg, SpO2 100 %.   Principal Problem: Psychosis (HCC) Diagnosis: Principal Problem:   Psychosis (HCC) Active Problems:   PTSD (post-traumatic stress disorder)   Bipolar 1 disorder, depressed, severe (HCC)  Total Time spent with patient: 25 minutes  Past Psychiatric History: see admission H&P  Past Medical History:  Past Medical History:  Diagnosis Date  . Anemia   . Herpes     Past Surgical History:  Procedure Laterality Date  . NO PAST SURGERIES     Family History:  Family History  Problem Relation Age of Onset  . Healthy Neg Hx    Family Psychiatric  History: see admission H&P   Social History:  Social History   Substance and Sexual Activity  Alcohol Use No     Social History   Substance and Sexual Activity  Drug Use Yes  . Types: Marijuana    Social History   Socioeconomic History  . Marital status: Single    Spouse name: Not on file  . Number of children: Not on file  . Years of education: Not on file  .  Highest education level: Not on file  Occupational History  . Not on file  Tobacco Use  . Smoking status: Current Every Day Smoker    Packs/day: 1.50    Types: Cigarettes  . Smokeless tobacco: Never Used  Substance and Sexual Activity  . Alcohol use: No  . Drug use: Yes    Types: Marijuana  . Sexual activity: Not Currently  Other Topics Concern  . Not on file  Social History Narrative  . Not on file   Social Determinants of Health   Financial Resource Strain: Not on file  Food Insecurity: Not on file  Transportation Needs: Not on  file  Physical Activity: Not on file  Stress: Not on file  Social Connections: Not on file   Additional Social History:           Has been living with her significant other and their 72-year-old child.  Patient feels that she should not return home.  She does not feel that her daughter is in danger, and believes her daughter would be better off living with her husband while she works on her mental health.              Sleep: Good  Appetite:  Good  Current Medications: Current Facility-Administered Medications  Medication Dose Route Frequency Provider Last Rate Last Admin  . acetaminophen (TYLENOL) tablet 650 mg  650 mg Oral Q6H PRN Claudie Revering, MD   650 mg at 01/31/21 1015  . alum & mag hydroxide-simeth (MAALOX/MYLANTA) 200-200-20 MG/5ML suspension 30 mL  30 mL Oral Q4H PRN Estella Husk, MD      . Melene Muller ON 02/01/2021] ARIPiprazole (ABILIFY) tablet 10 mg  10 mg Oral Daily Alla Sloma C, NP      . clonazePAM (KLONOPIN) tablet 0.5 mg  0.5 mg Oral BID Heinrich Fertig C, NP      . divalproex (DEPAKOTE) DR tablet 500 mg  500 mg Oral Q12H Claudie Revering, MD   500 mg at 01/31/21 0754  . docusate sodium (COLACE) capsule 100 mg  100 mg Oral Daily Singleton, [name], MD      . hydrOXYzine (ATARAX/VISTARIL) tablet 25 mg  25 mg Oral TID PRN Estella Husk, MD   25 mg at 01/30/21 2114  . magnesium hydroxide (MILK OF MAGNESIA) suspension 30 mL  30 mL Oral Daily PRN Estella Husk, MD   30 mL at 01/29/21 2200    Lab Results:  No results found for this or any previous visit (from the past 48 hour(s)).  Blood Alcohol level:  Lab Results  Component Value Date   ETH <10 01/24/2021   ETH <10 06/14/2018    Metabolic Disorder Labs: Lab Results  Component Value Date   HGBA1C 5.4 01/24/2021   MPG 108.28 01/24/2021   No results found for: PROLACTIN Lab Results  Component Value Date   CHOL 145 01/24/2021   TRIG 125 01/24/2021   HDL 73 01/24/2021   CHOLHDL  2.0 01/24/2021   VLDL 25 01/24/2021   LDLCALC 47 01/24/2021    Physical Findings: AIMS: Facial and Oral Movements Muscles of Facial Expression: None, normal Lips and Perioral Area: None, normal Jaw: None, normal Tongue: None, normal,Extremity Movements Upper (arms, wrists, hands, fingers): None, normal Lower (legs, knees, ankles, toes): None, normal, Trunk Movements Neck, shoulders, hips: None, normal, Overall Severity Severity of abnormal movements (highest score from questions above): None, normal Incapacitation due to abnormal movements: None, normal Patient's awareness of abnormal movements (  rate only patient's report): No Awareness, Dental Status Current problems with teeth and/or dentures?: No Does patient usually wear dentures?: No  CIWA:    COWS:      Psychiatric Specialty Exam:  Presentation  General Appearance: Appropriate for Environment; Fairly Groomed  Eye Contact:Fleeting  Speech:Normal Rate; Other (comment) (Some intermittent latency of speech)  Speech Volume:Normal  Handedness:Right   Mood and Affect  Mood:Anxious; Depressed; Irritable; Labile  Affect:Labile; Tearful   Thought Process  Thought Processes:Coherent  Descriptions of Associations:Tangential  Orientation:Full (Time, Place and Person)  Thought Content:Paranoid Ideation; Tangential; Delusions (Voices concerns about impostors and receiving subliminal messages)  History of Schizophrenia/Schizoaffective disorder:No  Duration of Psychotic Symptoms:Less than six months  Hallucinations:No data recorded  Ideas of Reference:Paranoia; Other (comment) (States concerns about being subjected to subliminal messages and about impostors)  Suicidal Thoughts:No data recorded  Homicidal Thoughts:No data recorded   Sensorium  Memory:Immediate Fair; Recent Fair; Remote Fair  Judgment:Impaired  Insight:Shallow   Executive Functions  Concentration:Fair  Attention  Span:Fair  Recall:Fair  Fund of Knowledge:Fair  Language:Fair   Psychomotor Activity  Psychomotor Activity:No data recorded   Assets  Assets:Desire for Improvement; Resilience   Sleep  Sleep:No data recorded    Physical Exam: Physical Exam Vitals and nursing note reviewed.  Constitutional:      Appearance: Normal appearance.  HENT:     Head: Normocephalic.  Pulmonary:     Effort: Pulmonary effort is normal.  Neurological:     Mental Status: She is alert and oriented to person, place, and time.    Review of Systems  Neurological: Negative for dizziness.  Psychiatric/Behavioral: Negative for hallucinations and suicidal ideas. The patient is nervous/anxious. The patient does not have insomnia.   All other systems reviewed and are negative.  Blood pressure 108/80, pulse (!) 114, temperature (!) 97.4 F (36.3 C), temperature source Oral, resp. rate 16, height 5\' 2"  (1.575 m), weight 68 kg, SpO2 100 %. Body mass index is 27.44 kg/m.   Treatment Plan Summary: Continue every 15-minute observation status  Encouraged participation in group therapy and therapeutic milieu  Bipolar disorder, current episode mixed with psychotic features - Discontinue Zyprexa to 10mg  every morning and 10 mg at bedtime for psychotic symptoms and mood stabilization-c/o SOB - Continue  Depakote 500mg  Q12H for mood stabilization  -Discontinue PRN Zyprexa, Ativan, Geodon for breakthrough agitation-side effect/allergy rash -Start Clonazepam 0.5 mg bid x 6 doses for severe anxiety/Agitation -Start Abilify 10 mg po daily for mental health, mood  Anxiety - Continue Vistaril 25 mg 3 times daily PRN  Back pain, moderate - Continue Tylenol 650mg  q6h PRN  Disposition planning in progress  , NP-PMHNP-BC 01/31/2021, 11:52 AM

## 2021-01-31 NOTE — Progress Notes (Signed)
   01/31/21 0626  Vital Signs  Temp (!) 97.4 F (36.3 C)  Temp Source Oral  Pulse Rate 92  BP 110/80  BP Location Right Arm  BP Method Automatic  Patient Position (if appropriate) Sitting   D:  Patient denies SI/HI/AVH.Pt rated anxiety  5/10 and depression 2/10. Patient had pink "dot" rash on torso and arms. A:  Patient took scheduled medicine.  Pt given benadryl for rash. Support and encouragement provided Routine safety checks conducted every 15 minutes. Patient  Informed to notify staff with any concerns.   R:  Safety maintained.

## 2021-01-31 NOTE — Progress Notes (Signed)
Patient has been up in the dayroom listening to music with other patients. Writer spoke with her 1:1 and she reports her day started off a little rough but is better. She reports working on staying away from other patients that may be negative in thoughts or comments to help keep herself in a more positive mood. She was compliant with hs medications. Safety maintained with 15 min checks and encouraged to continue

## 2021-01-31 NOTE — [facility] Group Notes (Signed)
LCSW Group Therapy Note  01/31/2021       Type of Therapy and Topic:  Group Therapy: "My Mental Health"  Participation Level:  Minimal   Description of Group:   In this group, patients were asked four questions in order to generate discussion around the idea of mental illness and/or substance addiction being medical problems: In one sentence describe the current state of your mental health or substance use. How much do you feel similar to or different from others? Do you tend to identify with other people or compare yourself to them?  In a word or sentence, share what you desire your mental health or substance use to be moving forward.  Discussion was held that led to the conclusion that comparing ourselves to others is not healthy, but identifying with the elements of their issues that are similar to ours is helpful.    Therapeutic Goals: Patients will identify their feelings about their current mental health/substance use problems. Patients will describe how they feel similar to or different from others, and whether they tend to identify with or compare themselves to other people with the same issues. Patients will explore the differences in these concepts and how a change of mindset about mental health/substance use can help with reaching recovery goals. Patients will think about and share what their recovery goals are, in terms of mental health and/or substance use.  Summary of Patient Progress:  The patient shared that the current state of her mental health is "on edge, on the verge of a breakdown."  She wanted to ask a question directly to another patient, who refused to hear her out because of past issues apparently.  The patient then said she would leave the room because she could not stay in group under those circumstances.  Therapeutic Modalities:   Processing Motivational Interviewing Psychoeducation  Lynnell Chad, MSW, LCSW

## 2021-01-31 NOTE — Progress Notes (Signed)
   01/31/21 2100  COVID-19 Daily Checkoff  Have you had a fever (temp > 37.80C/100F)  in the past 24 hours?  No  If you have had runny nose, nasal congestion, sneezing in the past 24 hours, has it worsened? No  COVID-19 EXPOSURE  Have you traveled outside the state in the past 14 days? No  Have you been in contact with someone with a confirmed diagnosis of COVID-19 or PUI in the past 14 days without wearing appropriate PPE? No  Have you been living in the same home as a person with confirmed diagnosis of COVID-19 or a PUI (household contact)? No  Have you been diagnosed with COVID-19? No   

## 2021-02-01 MED ORDER — DIPHENHYDRAMINE HCL 25 MG PO CAPS
50.0000 mg | ORAL_CAPSULE | Freq: Three times a day (TID) | ORAL | Status: DC | PRN
  Administered 2021-02-02: 50 mg via ORAL
  Filled 2021-02-01: qty 2

## 2021-02-01 MED ORDER — ARIPIPRAZOLE 15 MG PO TABS
15.0000 mg | ORAL_TABLET | Freq: Every day | ORAL | Status: DC
  Filled 2021-02-01 (×2): qty 1

## 2021-02-01 MED ORDER — ARIPIPRAZOLE 5 MG PO TABS
5.0000 mg | ORAL_TABLET | Freq: Once | ORAL | Status: AC
  Administered 2021-02-01: 5 mg via ORAL
  Filled 2021-02-01 (×2): qty 1
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
## 2021-02-01 NOTE — Progress Notes (Addendum)
Recreation Therapy Notes  Date: 5.30.22 Time: 1000 Location: 500 Hall Dayroom  Group Topic: Wellness  Goal Area(s) Addresses:  Patient will define components of whole wellness. Patient will verbalize benefit of whole wellness.  Behavioral Response: Active  Intervention: Exercise, Music  Activity: Exercise.  LRT lead group in a series of stretches to get them loosened up for the rest of the activity.  Patients would then take turns leading the group in exercises of their choosing.  Patients were encouraged to do the best they could for at least 30 minutes.  Patients were also encouraged to take breaks or get water if needed.  Education: Wellness, Building control surveyor.   Education Outcome: Acknowledges education/In group clarification offered/Needs additional education.   Clinical Observations/Feedback: Pt was brighter and smiling more.  Pt was very onto the activity.  Pt led group in squats, dips, planks and crunches.  Pt seemed to enjoy being around peers and socializing with them.    Caroll Rancher, LRT/CTRS     Caroll Rancher A 02/01/2021 12:25 PM

## 2021-02-01 NOTE — Progress Notes (Signed)
Pt visible on the unit some this evening, pt continues to believe people are talking about pt and ganging up on her. Pt has no insight into her Tx and pt generalizes all of her ideals.     02/01/21 2000  Psych Admission Type (Psych Patients Only)  Admission Status Voluntary  Psychosocial Assessment  Patient Complaints Anxiety;Worrying;Suspiciousness  Eye Contact Fair  Facial Expression Flat  Affect Appropriate to circumstance  Speech Logical/coherent  Interaction Minimal;No initiation  Motor Activity Slow  Appearance/Hygiene Unremarkable  Behavior Characteristics Agitated;Anxious  Mood Anxious;Labile  Thought Administrator, sports thinking  Content Blaming others;Paranoia  Delusions Paranoid;Persecutory  Perception Derealization  Hallucination None reported or observed  Judgment Poor  Confusion None  Danger to Self  Current suicidal ideation? Denies  Danger to Others  Danger to Others None reported or observed
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
## 2021-02-01 NOTE — Plan of Care (Signed)
  Problem: Education: Goal: Emotional status will improve Outcome: Not Progressing   Problem: Education: Goal: Mental status will improve Outcome: Not Progressing

## 2021-02-01 NOTE — Progress Notes (Signed)
Adult Psychoeducational Group Note  Date:  02/01/2021 Time:  12:35 AM  Group Topic/Focus:  Wrap-Up Group:   The focus of this group is to help patients review their daily goal of treatment and discuss progress on daily workbooks.  Participation Level:  Active  Participation Quality:  Appropriate  Affect:  Appropriate  Cognitive:  Appropriate  Insight: Appropriate  Engagement in Group:  Developing/Improving  Modes of Intervention:  Discussion  Additional Comments:  Pt stated her goal for today was to focus on her treatment plan. Pt stated she accomplished her goal today. Pt stated she talk with her doctor but did not get a chance to talk with her social worker about her care today. Pt rated her overall day a 8 out of 10. Pt stated she was able to contact her daughter today which improved her day. Pt stated she felt better about herself tonight. Pt stated she was brought back all meals because she is on unit restriction. Pt stated she took all medications provided today. Pt stated her appetite was good today. Pt rated sleep last night as fair. Pt stated the goal tonight was to get some rest. Pt stated she had some physical pain today. Pt stated she had some moderate pain in her back tonight. PT rate the moderate pain in her back a 5 on the pain level scale. Pt nurse was updated on the situation. Pt deny visual hallucinations and auditory issues tonight. Pt denies thoughts of harming herself or others. Pt stated she would alert staff if anything changed.  Lindsey Prince 02/01/2021, 12:35 AM

## 2021-02-01 NOTE — Progress Notes (Signed)
   02/01/21 0606  Vital Signs  Pulse Rate (!) 105  BP 109/79  BP Location Right Arm  BP Method Automatic  Patient Position (if appropriate) Standing  Sleep  Number of Hours 6.75   D: Patient denies SI/HI/AVH. Patient rated anxiety 7/10 and depression 5/10. Pt. Had a light pink rash on arms and torso, improvement from yesterday. A:  Patient took scheduled medicine. Pt had some vistaril for the allergic reactions and Tylenol for pain in her neck.   Support and encouragement provided Routine safety checks conducted every 15 minutes. Patient  Informed to notify staff with any concerns.   R:  Safety maintained.

## 2021-02-01 NOTE — [facility] Group Notes (Signed)
[facility] Group Notes:  (Nursing/MHT/Case Management/Adjunct)  Date:  02/01/2021  Time:  10:07 AM  Type of Therapy:  Group Therapy  Participation Level:  Active  Participation Quality:  Appropriate  Affect:  Appropriate  Cognitive:  Appropriate  Insight:  Appropriate  Engagement in Group:  Engaged  Modes of Intervention:  Orientation  Summary of Progress/Problems: Her goal for today is to concentrate better, maintain composure and correct people in a civil manner.   Lindsey Prince Lindsey Prince 02/01/2021, 10:07 AM

## 2021-02-01 NOTE — Progress Notes (Signed)
Pt continues to be paranoid about peers, pt paranoid about devices in her controlling her. Pt perception is distorted and pt has very little insight when various things explained to her.

## 2021-02-01 NOTE — Progress Notes (Signed)
Adult Psychoeducational Group Note  Date:  02/01/2021 Time:  11:35 PM  Group Topic/Focus:  Wrap-Up Group:   The focus of this group is to help patients review their daily goal of treatment and discuss progress on daily workbooks.  Participation Level:  Active  Participation Quality:  Intrusive  Affect:  Defensive  Cognitive:  Confused  Insight: Limited  Engagement in Group:  Resistant  Modes of Intervention:  Discussion  Additional Comments:  Pt articulated that her goal for today was to improve concentration, and this goal was not accomplished. Motivation for treatment appeared poor, as indicated by her distraction and inattention. The treatment appeared poor as the patient appeared preoccupied with personal issues. It was difficult for her to engage in group tasks. Pt stated she did not talk with a medical doctor and nurse about her care. Pt conveyed that she made no phone calls to her family members and reported relationship with her family and support system was the same. Pt reported she took all medications provided and attended all meal services with 100% intake. Pt said her appetite was improving today. Pt evaluated her sleep last night as good. Pt verbalized she felt worse about herself and rated her overall day a 5 out of 10 on this date. Pt articulated that she had no physical pain. Pt denies no auditory or visual hallucinations or thoughts of harming himself or others. Pt said she would alert staff if anything changed. By self-report, she is making limited progress toward her goals. She demonstrates poor problem-solving skills coupled with an apparent inability to link consequences to actions--end of Wrap-Up Group progress report.     Nicoletta Dress 02/01/2021, 11:35 PM

## 2021-02-02 MED ORDER — LORATADINE 10 MG PO TABS
10.0000 mg | ORAL_TABLET | Freq: Every day | ORAL | Status: DC
  Administered 2021-02-03 – 2021-02-07 (×5): 10 mg via ORAL
  Filled 2021-02-02 (×2): qty 1
  Filled 2021-02-02: qty 7
  Filled 2021-02-02 (×6): qty 1

## 2021-02-02 MED ORDER — CLONAZEPAM 0.125 MG PO TBDP
0.2500 mg | ORAL_TABLET | Freq: Two times a day (BID) | ORAL | Status: AC
  Administered 2021-02-02 – 2021-02-03 (×3): 0.25 mg via ORAL
  Filled 2021-02-02 (×3): qty 2

## 2021-02-02 MED ORDER — PROPRANOLOL HCL 10 MG PO TABS
10.0000 mg | ORAL_TABLET | Freq: Three times a day (TID) | ORAL | Status: DC
  Administered 2021-02-02 – 2021-02-07 (×15): 10 mg via ORAL
  Filled 2021-02-02 (×8): qty 1
  Filled 2021-02-02: qty 21
  Filled 2021-02-02 (×3): qty 1
  Filled 2021-02-02: qty 21
  Filled 2021-02-02 (×8): qty 1
  Filled 2021-02-02: qty 21
  Filled 2021-02-02: qty 1

## 2021-02-02 MED ORDER — ARIPIPRAZOLE 10 MG PO TABS
20.0000 mg | ORAL_TABLET | Freq: Every day | ORAL | Status: DC
  Administered 2021-02-02 – 2021-02-05 (×4): 20 mg via ORAL
  Filled 2021-02-02 (×7): qty 2

## 2021-02-02 MED ORDER — DIPHENHYDRAMINE HCL 25 MG PO CAPS
25.0000 mg | ORAL_CAPSULE | Freq: Three times a day (TID) | ORAL | Status: DC | PRN
  Administered 2021-02-03: 25 mg via ORAL
  Filled 2021-02-02: qty 1

## 2021-02-02 NOTE — [facility] Group Notes (Signed)
[facility] Group Notes:  (Nursing/MHT/Case Management/Adjunct)  Date:  02/02/2021  Time:  10:44 AM  Type of Therapy:  Group Therapy  Participation Level:  Active  Participation Quality:  Appropriate  Affect:  Appropriate  Cognitive:  Appropriate  Insight:  Good and Improving  Engagement in Group:  Engaged and Improving  Modes of Intervention:  Orientation  Summary of Progress/Problems: Her goal today is to continue to try and concentrate more on herself.   Kellie Murrill J Deberah Adolf 02/02/2021, 10:44 AM

## 2021-02-02 NOTE — Progress Notes (Signed)
Recreation Therapy Notes  Date: 5.31.22 Time: 1000 Location: 500 Hall Dayroom  Group Topic: Coping Skills  Goal Area(s) Addresses:  Patient will identify positive coping skills. Patient will identify importance of using proper coping skills.  Behavioral Response: Engaged  Intervention: Worksheet  Activity: Mind Map.  LRT and patients filled out the first 8 boxes together (depression, anxiety, post partum, OCD, Bi-Polar, Split personality, betrayal and PTSD) with situations is which coping skills can be used.  The group would then break out and come with coping skills for each situation identified individually.  The group would then come back together and share the coping skills they came up with.  LRT would write the coping skills on the board with the corresponding situation.  Education: Pharmacologist, Building control surveyor.   Education Outcome: Acknowledges understanding/In group clarification offered/Needs additional education.   Clinical Observations/Feedback: Pt was pleasant and attentive during group.  Pt was appropriate and able to focus on the activity.  Pt came up the coping skills of reading/workout/music for depression, daydream/drawing/dancing for PTSD, shower/bath/workout for anxiety, breathing/laughing for split personality and therapist for OCD.    Caroll Rancher, LRT/CTRS     Caroll Rancher A 02/02/2021 11:52 AM

## 2021-02-02 NOTE — Progress Notes (Signed)
Pt got Benadryl per [facility] [id number]

## 2021-02-02 NOTE — Progress Notes (Signed)
[facility] MD Progress Note  02/02/2021 5:27 PM Lindsey Prince  MRN:  [id number]   Reason for admission: Lindsey Prince LP presented to [facility] ED via [employer] after her boyfriend called stating she was exhibiting paranoid behavior, covering up reflective objects in her house and he was concerned for her safety as well as the safety of their child. Agreed to voluntary commitment at [facility] to build support and gain coping skills for difficult life situation. Admitted to [facility] for crisis stabilization and medication management.   Objective: Medical record reviewed.  Patient's case discussed in detail with members of the treatment team.  MD and medical student met with and evaluated the patient for follow-up today. Lindsey Prince was participating in group prior to our interview.  She looks better on interview today and reports she feels better. She reports her mood is good, more up than down, positive, and hopeful. She states that there was an incident last night where she could not remember her goal for the day and got overwhelmed and anxious about this, reached out to the father of her daughter for comfort and is very ruminative about this today. She states that she woke up a bit depressed this morning thinking about that but has improved since. She is fairly preoccupied with somatic symptoms--reports congestion, mild SOB when trying to exercise in the AM, started period this morning. Will prescribe decongestant. Lindsey Prince says the rash she had is still present, improving in some areas but worse in others. It does not itch or hurt but is present on hands and forearms, abdomen, and hips/outer thighs. Multiple faint red papules noted on exam in these areas. She continues to have persecutory paranoid delusions referential thinking about people trying to sway her, patients/staff being connected to daughter's father, people spreading rumors about her/talking about her/laughing at her. During our interview she became concerned that people were listening and stated  "I feel like it's a conspiracy against me, people come at me harder when I'm happy; feels like half the city is against me." She says she had some passive SI wishing she was not alive last night, but no longer feels this way today. Denies HI. Her speech is more spontaneous and clearer today and she is less guarded, less labile, and less irritable during our conversation today. She believes the medications are helping, is happy with the recent changes, and denies side effects except for rash.  She slept 6.5 hours last night.  Nursing notes reviewed, Lindsey Prince has been attending groups and been less disruptive in most of them but remains paranoid of peers. Vital signs reviewed and stable, some tachycardia noted to 121 this AM. Adding Propranolol to address, no history of asthma.   Principal Problem: Psychosis ([place]) Diagnosis: Principal Problem:   Psychosis ([place]) Active Problems:   PTSD (post-traumatic stress disorder)   Bipolar 1 disorder, depressed, severe ([place])  Total Time spent with patient: 20 minutes  Past Psychiatric History: See admission H&P  Past Medical History:  Past Medical History:  Diagnosis Date  . Anemia   . Herpes     Past Surgical History:  Procedure Laterality Date  . NO PAST SURGERIES     Family History:  Family History  Problem Relation Age of Onset  . Healthy Neg Hx    Family Psychiatric  History: See admission H&P Social History:  Social History   Substance and Sexual Activity  Alcohol Use No     Social History   Substance and Sexual Activity  Drug Use Yes  .  Types: Marijuana    Social History   Socioeconomic History  . Marital status: Single    Spouse name: Not on file  . Number of children: Not on file  . Years of education: Not on file  . Highest education level: Not on file  Occupational History  . Not on file  Tobacco Use  . Smoking status: Current Every Day Smoker    Packs/day: 1.50    Types: Cigarettes  . Smokeless tobacco: Never Used   Substance and Sexual Activity  . Alcohol use: No  . Drug use: Yes    Types: Marijuana  . Sexual activity: Not Currently  Other Topics Concern  . Not on file  Social History Narrative  . Not on file   Social Determinants of Health   Financial Resource Strain: Not on file  Food Insecurity: Not on file  Transportation Needs: Not on file  Physical Activity: Not on file  Stress: Not on file  Social Connections: Not on file   Additional Social History:                         Sleep: Good  Appetite:  Good  Current Medications: Current Facility-Administered Medications  Medication Dose Route Frequency Provider Last Rate Last Admin  . acetaminophen (TYLENOL) tablet 650 mg  650 mg Oral Q6H PRN Arthor Captain, MD   650 mg at 02/02/21 1522  . alum & mag hydroxide-simeth (MAALOX/MYLANTA) 200-200-20 MG/5ML suspension 30 mL  30 mL Oral Q4H PRN Ival Bible, MD   30 mL at 01/31/21 1821  . ARIPiprazole (ABILIFY) tablet 20 mg  20 mg Oral Daily Arthor Captain, MD   20 mg at 02/02/21 0817  . clonazePAM (KLONOPIN) disintegrating tablet 0.25 mg  0.25 mg Oral BID Arthor Captain, MD   0.25 mg at 02/02/21 1645  . diphenhydrAMINE (BENADRYL) capsule 25 mg  25 mg Oral Q8H PRN Arthor Captain, MD      . docusate sodium (COLACE) capsule 100 mg  100 mg Oral Daily Nelda Marseille, [name], MD   100 mg at 02/02/21 1610  . hydrOXYzine (ATARAX/VISTARIL) tablet 25 mg  25 mg Oral TID PRN Ival Bible, MD   25 mg at 02/01/21 2137  . loratadine (CLARITIN) tablet 10 mg  10 mg Oral Daily Arthor Captain, MD      . magnesium hydroxide (MILK OF MAGNESIA) suspension 30 mL  30 mL Oral Daily PRN Ival Bible, MD   30 mL at 01/29/21 2200  . propranolol (INDERAL) tablet 10 mg  10 mg Oral TID Arthor Captain, MD   10 mg at 02/02/21 1520    Lab Results: No results found for this or any previous visit (from the past 48 hour(s)).  Blood Alcohol level:  Lab Results  Component Value Date   ETH  <10 01/24/2021   ETH <10 96/12/5407    Metabolic Disorder Labs: Lab Results  Component Value Date   HGBA1C 5.4 01/24/2021   MPG 108.28 01/24/2021   No results found for: PROLACTIN Lab Results  Component Value Date   CHOL 145 01/24/2021   TRIG 125 01/24/2021   HDL 73 01/24/2021   CHOLHDL 2.0 01/24/2021   VLDL 25 01/24/2021   LDLCALC 47 01/24/2021    Physical Findings: AIMS: Facial and Oral Movements Muscles of Facial Expression: None, normal Lips and Perioral Area: None, normal Jaw: None, normal Tongue: None, normal,Extremity Movements Upper (arms, wrists, hands,  fingers): None, normal Lower (legs, knees, ankles, toes): None, normal, Trunk Movements Neck, shoulders, hips: None, normal, Overall Severity Severity of abnormal movements (highest score from questions above): None, normal Incapacitation due to abnormal movements: None, normal Patient's awareness of abnormal movements (rate only patient's report): No Awareness, Dental Status Current problems with teeth and/or dentures?: No Does patient usually wear dentures?: No  CIWA:    COWS:     Musculoskeletal: Strength & Muscle Tone: within normal limits Gait & Station: normal Patient leans: N/A  Psychiatric Specialty Exam:  Presentation  General Appearance: Appropriate for Environment; Fairly Groomed  Eye Contact:Good  Speech:Clear and Coherent; Normal Rate  Speech Volume:Normal  Handedness:Right   Mood and Affect  Mood:Anxious  Affect:Full Range; Tearful; Labile; Other (comment) (Less labile but mild mood lability persists)   Thought Process  Thought Processes:Coherent; Goal Directed  Descriptions of Associations:Tangential  Orientation:Full (Time, Place and Person)  Thought Content:Paranoid Ideation; Delusions; Tangential  History of Schizophrenia/Schizoaffective disorder:No  Duration of Psychotic Symptoms:Greater than six months  Hallucinations:Hallucinations: None  Ideas of  Reference:Paranoia; Percusatory  Suicidal Thoughts:Suicidal Thoughts: No  Homicidal Thoughts:Homicidal Thoughts: No   Sensorium  Memory:Immediate Fair; Recent Fair; Remote Fair  Judgment:Fair  Insight:Shallow   Executive Functions  Concentration:Fair  Attention Span:Fair  [place]  Language:Good   Psychomotor Activity  Psychomotor Activity:Psychomotor Activity: Normal   Assets  Assets:Communication Skills; Desire for Improvement; Physical Health; Resilience   Sleep  Sleep:Sleep: Good Number of Hours of Sleep: 6.5    Physical Exam: Physical Exam Vitals and nursing note reviewed.  Constitutional:      General: She is not in acute distress. HENT:     Head: Normocephalic and atraumatic.  Pulmonary:     Effort: Pulmonary effort is normal.  Skin:    Findings: Rash present.     Comments: Faint red papules noted on dorsal surface of hands, forearms, and abdomen. Pt states they are also present on outer hips/thighs, did not examine this area.   Neurological:     General: No focal deficit present.     Mental Status: She is alert and oriented to person, place, and time.    Review of Systems  Constitutional: Negative.   HENT: Positive for congestion.   Respiratory: Negative.   Cardiovascular: Negative.   Gastrointestinal: Negative for constipation, diarrhea, nausea and vomiting.  Musculoskeletal: Positive for back pain.  Skin: Positive for itching and rash.  Neurological: Positive for dizziness and headaches. Negative for tremors and seizures.       Occasional headaches, AM orthostasis/dizziness  Psychiatric/Behavioral: Positive for depression. Negative for hallucinations and suicidal ideas. The patient is nervous/anxious.    Blood pressure 121/83, pulse (!) 103, temperature 97.8 F (36.6 C), temperature source Oral, resp. rate 18, height 5\' 2"  (1.575 m), weight 68 kg, SpO2 100 %. Body mass index is 27.44 kg/m.   Treatment Plan  Summary: Daily contact with patient to assess and evaluate symptoms and progress in treatment and Medication management   Continue every 15-minute observation status  Encouraged participation in group therapy and therapeutic milieu  Bipolar disorder, current episode mixed with psychotic features -Increase Abilify to 20 mg daily.    Anxiety -Continue clonazepam 0.25 mg twice daily.  Anticipate discontinuation prior to discharge -Continue Vistaril 25 mg 3 times daily PRN -Start propranolol 10mg  TID for anxiety, tachycardia Back pain, moderate - Continue Tylenol 650mg  q6h PRN  Nasal congestion -Start Claritin 10mg  daily   Disposition planning in progress  Clydene Pugh,  Medical Student Ethelene Browns, MD  I attest that I saw and interviewed the patient and that I performed or reperformed the mental status examination of the patient. I reviewed the medical record. I have formulated the assessment and plan of treatment. I am in agreement with the exam, assessment and plan as documented above.  Arthor Captain, MD 02/02/2021, 5:27 PM
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
## 2021-02-02 NOTE — Plan of Care (Signed)
  Problem: Education: Goal: Knowledge of [facility] Education information/materials will improve Outcome: Progressing   Problem: Education: Goal: Emotional status will improve Outcome: Progressing   

## 2021-02-02 NOTE — Progress Notes (Signed)
   02/02/21 0500  Sleep  Number of Hours 6.5

## 2021-02-02 NOTE — Progress Notes (Signed)
Adult Psychoeducational Group Note  Date:  02/02/2021 Time:  9:00 PM  Group Topic/Focus:  Wrap-Up Group:   The focus of this group is to help patients review their daily goal of treatment and discuss progress on daily workbooks.  Participation Level:  Minimal  Participation Quality:  Appropriate  Affect:  Appropriate  Cognitive:  Appropriate  Insight: Appropriate  Engagement in Group:  Developing/Improving  Modes of Intervention:  Discussion  Additional Comments: Pt stated her goal for today was to focus on her treatment plan. Pt stated she accomplished her goal today. Pt stated she talk with her doctor but did not get a chance to talk with her social worker about her care today. Pt rated her overall day a10. Pt stated she was able to contacther daughter todaywhich improved her day. Pt stated she felt better about herself tonight. Pt stated she attend all meals today. Pt stated she took all medications provided today. Pt stated her appetite was good today. Pt rated sleep last night asfair. Pt stated the goal tonight was to get some rest. Pt stated she hadsomephysical pain today. Pt stated she had some severe pain in her stomach tonight. PT rate the severe pain in her stomach a 7 on the pain level scale. Pt nurse was updated on the situation.Pt deny visual hallucinations and auditory issues tonight. Pt denies thoughts of harming herself or others. Pt stated she would alert staff if anything changed.   [name] 02/02/2021, 9:00 PM

## 2021-02-02 NOTE — Progress Notes (Addendum)
Pt stated she was doing better. Pt visible on the unit this evening. Pt stated she was doing ok. Pt given HS medication. Pt continues to be paranoid , but appears to better handle it sometimes.      02/02/21 2300  Psych Admission Type (Psych Patients Only)  Admission Status Voluntary  Psychosocial Assessment  Patient Complaints Anxiety  Eye Contact Fair  Facial Expression Flat  Affect Appropriate to circumstance  Speech Logical/coherent  Interaction Minimal;No initiation  Motor Activity Slow  Appearance/Hygiene Unremarkable  Behavior Characteristics Anxious  Mood Anxious  Thought Process  Coherency Concrete thinking  Content Blaming others;Paranoia  Delusions Paranoid;Persecutory  Perception Derealization  Hallucination None reported or observed  Judgment Poor  Confusion None  Danger to Self  Current suicidal ideation? Denies  Danger to Others  Danger to Others None reported or observed

## 2021-02-03 MED ORDER — TRAZODONE HCL 50 MG PO TABS
50.0000 mg | ORAL_TABLET | Freq: Every evening | ORAL | Status: DC | PRN
  Administered 2021-02-03: 50 mg via ORAL
  Filled 2021-02-03: qty 1

## 2021-02-03 NOTE — Progress Notes (Signed)
   02/03/21 0500  Sleep  Number of Hours 6   

## 2021-02-03 NOTE — Progress Notes (Signed)
Recreation Therapy Notes  Date: 6.1.22 Time: 1000 Location: 500 Hall Dayroom  Group Topic: Self Esteem, Leisure Education  Goal Area(s) Addresses:  Patient will successfully identify positive characteristics about themselves. Patient will successfully identify things they like to do for fun.   Patient will successfully identify the benefit of positive self esteem.  Behavioral Response: Engaged  Intervention: Art - Personal assistant, glue sticks, construction paper, and magazines  Activity: Patient asked to create a personalized collage on their construction paper color of choice. Patients were told to make the collage relative to them and identify things that make them who they or activities they find interesting.  Patients were given multiple craft materials to complete this activity.   Education:Self Esteem, Discharge Planning.   Education Outcome: Acknowledges education.   Clinical Observations/Feedback: Patient was quiet and seemed focused on the activity.  Patient described some of the things about her such as wanting peace and tranquility.  Patient expressed this by using a picture of a waterfall and sunset.  Patient also likes to workout and eat healthy, which was represented by showing a woman working out and a honey comb.  Lastly, patient expressed liking solitude by showing an elephant roaming by themselves.   Caroll Rancher, LRT/ CTRS    Lillia Abed, Taeja Debellis A 02/03/2021 1:14 PM

## 2021-02-03 NOTE — [facility] Group Notes (Signed)
Topic: Emotions  Due to the acuity and complex discharge plans, group was not held. Patient was provided therapeutic worksheets and asked to meet with CSW as needed.   Rosene Pilling MSW, LCSW Clincal Social Worker  [facility] 

## 2021-02-03 NOTE — Progress Notes (Signed)
Psychoeducational Group Note  Date:  02/03/2021 Time:  2103  Group Topic/Focus:  Wrap-Up Group:   The focus of this group is to help patients review their daily goal of treatment and discuss progress on daily workbooks.  Participation Level: Did Not Attend  Participation Quality:  Not Applicable  Affect:  Not Applicable  Cognitive:  Not Applicable  Insight:  Not Applicable  Engagement in Group: Not Applicable  Additional Comments:  The patient did not attend group this evening.   [name] S 02/03/2021, 9:03 PM

## 2021-02-03 NOTE — [facility] Group Notes (Signed)
[facility] Group Notes:  (Nursing/MHT/Case Management/Adjunct)  Date:  02/03/2021  Time:  9:42 AM  Type of Therapy:  Group Therapy  Participation Level:  Active  Participation Quality:  Appropriate  Affect:  Appropriate  Cognitive:  Alert and Appropriate  Insight:  Appropriate  Engagement in Group:  Engaged  Modes of Intervention:  Orientation  Summary of Progress/Problems: Her goal for today is to stay positive and to ignore the opinions of others.   Annibelle Brazie J Lashell Moffitt 02/03/2021, 9:42 AM

## 2021-02-03 NOTE — Progress Notes (Signed)
[facility] MD Progress Note  02/03/2021 5:57 PM Lindsey Prince  MRN:  [id number]   Reason for admission: [facility] presented to [facility] ED via [employer] after her boyfriend called stating she was exhibiting paranoid behavior, covering up reflective objects in her house and he was concerned for her safety as well as the safety of their child. Agreed to voluntary commitment at [facility] to build support and gain coping skills for difficult life situation. Admitted to [facility] for crisis stabilization and medication management.   Objective: Medical record reviewed.  Patient's case discussed in detail with members of the treatment team.  MD and medical student met with and evaluated the patient for follow-up today in the patient's room. Lindsey Prince was participating actively in group just prior to our interview. She states her mood fluctuates between feeling well and a bit depressed depending on the situation. Her affect this morning is more reactive and brightens appropriately in response to certain topics. She notes that her appetite is increased. The rash noted previously is improving somewhat. She denies AH, VH, SI, HI. She still endorses paranoia about a conspiracy about people "trying to make her go crazy" and being connected with daughter's father, and she endorses referential thinking getting messages from lights flickering and finding meaning in the number 3 today. She states this is not new and has been happening for the last 2 months, but is slightly less frequent in the hospital.  She continues to have the concern that when she sees other patients talking to staff they may be talking about her.  The patient reports experiencing middle insomnia.  Nursing notes document that the patient slept 6 hours last night.  Per chart review, Latisia has been attending groups more regularly and is actively participating in an appropriate manner.   Principal Problem: Psychosis ([place]) Diagnosis: Principal Problem:   Psychosis ([place]) Active Problems:   PTSD  (post-traumatic stress disorder)   Bipolar 1 disorder, depressed, severe ([place])  Total Time spent with patient: 20 minutes  Past Psychiatric History: See admission H&P  Past Medical History:  Past Medical History:  Diagnosis Date  . Anemia   . Herpes     Past Surgical History:  Procedure Laterality Date  . NO PAST SURGERIES     Family History:  Family History  Problem Relation Age of Onset  . Healthy Neg Hx    Family Psychiatric  History: See admission H&P Social History:  Social History   Substance and Sexual Activity  Alcohol Use No     Social History   Substance and Sexual Activity  Drug Use Yes  . Types: Marijuana    Social History   Socioeconomic History  . Marital status: Single    Spouse name: Not on file  . Number of children: Not on file  . Years of education: Not on file  . Highest education level: Not on file  Occupational History  . Not on file  Tobacco Use  . Smoking status: Current Every Day Smoker    Packs/day: 1.50    Types: Cigarettes  . Smokeless tobacco: Never Used  Substance and Sexual Activity  . Alcohol use: No  . Drug use: Yes    Types: Marijuana  . Sexual activity: Not Currently  Other Topics Concern  . Not on file  Social History Narrative  . Not on file   Social Determinants of Health   Financial Resource Strain: Not on file  Food Insecurity: Not on file  Transportation Needs: Not on file  Physical Activity: Not on file  Stress: Not on file  Social Connections: Not on file   Additional Social History:                         Sleep: Good  Appetite:  Good  Current Medications: Current Facility-Administered Medications  Medication Dose Route Frequency Provider Last Rate Last Admin  . acetaminophen (TYLENOL) tablet 650 mg  650 mg Oral Q6H PRN Arthor Captain, MD   650 mg at 02/02/21 1522  . alum & mag hydroxide-simeth (MAALOX/MYLANTA) 200-200-20 MG/5ML suspension 30 mL  30 mL Oral Q4H PRN Ival Bible, MD   30 mL at 01/31/21 1821  . ARIPiprazole (ABILIFY) tablet 20 mg  20 mg Oral Daily Arthor Captain, MD   20 mg at 02/03/21 0747  . diphenhydrAMINE (BENADRYL) capsule 25 mg  25 mg Oral Q8H PRN Arthor Captain, MD   25 mg at 02/03/21 0514  . docusate sodium (COLACE) capsule 100 mg  100 mg Oral Daily Nelda Marseille, [name], MD   100 mg at 02/03/21 0747  . hydrOXYzine (ATARAX/VISTARIL) tablet 25 mg  25 mg Oral TID PRN Ival Bible, MD   25 mg at 02/03/21 1241  . loratadine (CLARITIN) tablet 10 mg  10 mg Oral Daily Arthor Captain, MD   10 mg at 02/03/21 0747  . magnesium hydroxide (MILK OF MAGNESIA) suspension 30 mL  30 mL Oral Daily PRN Ival Bible, MD   30 mL at 01/29/21 2200  . propranolol (INDERAL) tablet 10 mg  10 mg Oral TID Arthor Captain, MD   10 mg at 02/03/21 1343    Lab Results: No results found for this or any previous visit (from the past 48 hour(s)).  Blood Alcohol level:  Lab Results  Component Value Date   ETH <10 01/24/2021   ETH <10 25/63/8937    Metabolic Disorder Labs: Lab Results  Component Value Date   HGBA1C 5.4 01/24/2021   MPG 108.28 01/24/2021   No results found for: PROLACTIN Lab Results  Component Value Date   CHOL 145 01/24/2021   TRIG 125 01/24/2021   HDL 73 01/24/2021   CHOLHDL 2.0 01/24/2021   VLDL 25 01/24/2021   LDLCALC 47 01/24/2021    Physical Findings: AIMS: Facial and Oral Movements Muscles of Facial Expression: None, normal Lips and Perioral Area: None, normal Jaw: None, normal Tongue: None, normal,Extremity Movements Upper (arms, wrists, hands, fingers): None, normal Lower (legs, knees, ankles, toes): None, normal, Trunk Movements Neck, shoulders, hips: None, normal, Overall Severity Severity of abnormal movements (highest score from questions above): None, normal Incapacitation due to abnormal movements: None, normal Patient's awareness of abnormal movements (rate only patient's report): No Awareness, Dental  Status Current problems with teeth and/or dentures?: No Does patient usually wear dentures?: No  CIWA:    COWS:     Musculoskeletal: Strength & Muscle Tone: within normal limits Gait & Station: normal Patient leans: N/A  Psychiatric Specialty Exam:  Presentation  General Appearance: Appropriate for Environment; Fairly Groomed  Eye Contact:Good  Speech:Clear and Coherent; Normal Rate  Speech Volume:Normal  Handedness:Right   Mood and Affect  Mood:Anxious  Affect:Congruent   Thought Process  Thought Processes:Coherent  Descriptions of Associations:Tangential  Orientation:Full (Time, Place and Person)  Thought Content:Rumination; Paranoid Ideation; Delusions; Tangential  History of Schizophrenia/Schizoaffective disorder:No  Duration of Psychotic Symptoms:Greater than six months  Hallucinations:Hallucinations: None  Ideas of Reference:Paranoia; Other (comment) (Belief  that flickering of light indicates a message)  Suicidal Thoughts:Suicidal Thoughts: No  Homicidal Thoughts:Homicidal Thoughts: No   Sensorium  Memory:Immediate Good; Recent Fair; Remote Fair  Judgment:Fair  Insight:Fair   Executive Functions  Concentration:Fair  Attention Span:Fair  Star  Language:Good   Psychomotor Activity  Psychomotor Activity:Psychomotor Activity: Normal   Assets  Assets:Desire for Improvement; Armed forces logistics/support/administrative officer; Physical Health; Resilience; Social Support   Sleep  Sleep:Sleep: Fair Number of Hours of Sleep: 6    Physical Exam: Physical Exam Vitals and nursing note reviewed.  Constitutional:      General: She is not in acute distress. HENT:     Head: Normocephalic and atraumatic.  Pulmonary:     Effort: Pulmonary effort is normal.  Skin:    Findings: Rash present.     Comments: Rash is resolving  Neurological:     General: No focal deficit present.     Mental Status: She is alert and oriented to person,  place, and time.    Review of Systems  Constitutional: Negative.   Respiratory: Negative.   Cardiovascular: Negative.   Gastrointestinal: Negative for constipation, diarrhea, nausea and vomiting.  Musculoskeletal: Positive for back pain.  Skin: Positive for itching and rash.  Neurological: Negative for dizziness, tremors and seizures.  Psychiatric/Behavioral: Positive for depression. Negative for hallucinations and suicidal ideas. The patient is nervous/anxious.    Blood pressure 130/82, pulse 91, temperature 98 F (36.7 C), temperature source Oral, resp. rate 18, height 5\' 2"  (1.575 m), weight 68 kg, SpO2 100 %. Body mass index is 27.44 kg/m.   Treatment Plan Summary: Daily contact with patient to assess and evaluate symptoms and progress in treatment and Medication management   Continue every 15-minute observation status  Encouraged participation in group therapy and therapeutic milieu  Bipolar disorder, current episode mixed with psychotic features -Continue Abilify 20 mg daily.   -Consider change or addition of antipsychotic medication if paranoia and referential thinking do not improve some in next 1-2 days  Anxiety -Disontinue clonazepam -Continue Vistaril 25 mg 3 times daily PRN -Continue propranolol 10mg  TID for anxiety, tachycardia  Back pain, moderate - Continue Tylenol 650mg  q6h PRN  Nasal congestion/seasonal allergies/pruritus -Start Claritin 10mg  daily   Disposition planning in progress  Clydene Pugh, Medical Student Ethelene Browns, MD  I attest that I saw and interviewed the patient and that I performed or reperformed the mental status examination of the patient. I reviewed the medical record. I have formulated the assessment and plan of treatment. I am in agreement with the exam, assessment and plan as documented above.  Arthor Captain, MD

## 2021-02-03 NOTE — Progress Notes (Signed)
Progress note    02/03/21 0747  Psych Admission Type (Psych Patients Only)  Admission Status Voluntary  Psychosocial Assessment  Patient Complaints Anxiety  Eye Contact Watchful  Facial Expression Pensive  Affect Anxious  Speech Logical/coherent  Interaction Assertive  Motor Activity Slow  Appearance/Hygiene Unremarkable  Behavior Characteristics Cooperative;Appropriate to situation;Anxious  Mood Anxious;Pleasant  Thought Process  Coherency Concrete thinking  Content Paranoia  Delusions Paranoid  Perception Derealization  Judgment Poor  Confusion None  Danger to Self  Current suicidal ideation? Denies  Danger to Others  Danger to Others None reported or observed

## 2021-02-03 NOTE — Progress Notes (Signed)
Pt complaining of itching, pt given PRN Benadryl per [facility]
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
## 2021-02-03 NOTE — Plan of Care (Signed)
  Problem: Education: Goal: Will be free of psychotic symptoms Outcome: Progressing   Problem: Coping: Goal: Coping ability will improve Outcome: Progressing Goal: Will verbalize feelings Outcome: Progressing

## 2021-02-03 NOTE — Progress Notes (Signed)
Pt stated she woke up every hour and did not sleep that well

## 2021-02-04 DIAGNOSIS — F4310 Post-traumatic stress disorder, unspecified: Secondary | ICD-10-CM | POA: Diagnosis not present

## 2021-02-04 DIAGNOSIS — F29 Unspecified psychosis not due to a substance or known physiological condition: Secondary | ICD-10-CM | POA: Diagnosis not present

## 2021-02-04 DIAGNOSIS — F314 Bipolar disorder, current episode depressed, severe, without psychotic features: Secondary | ICD-10-CM | POA: Diagnosis not present

## 2021-02-04 MED ORDER — CARBAMAZEPINE 100 MG PO CHEW
100.0000 mg | CHEWABLE_TABLET | Freq: Two times a day (BID) | ORAL | Status: DC
  Administered 2021-02-04 – 2021-02-07 (×6): 100 mg via ORAL
  Filled 2021-02-04 (×3): qty 1
  Filled 2021-02-04: qty 14
  Filled 2021-02-04 (×4): qty 1
  Filled 2021-02-04: qty 14
  Filled 2021-02-04 (×2): qty 1

## 2021-02-04 MED ORDER — TRAZODONE HCL 100 MG PO TABS
100.0000 mg | ORAL_TABLET | Freq: Every evening | ORAL | Status: DC | PRN
  Administered 2021-02-04 – 2021-02-06 (×3): 100 mg via ORAL
  Filled 2021-02-04 (×4): qty 1

## 2021-02-04 NOTE — Progress Notes (Signed)
Psychoeducational Group Note  Date:  02/04/2021 Time:  2015  Group Topic/Focus:  Wrap-Up Group:   The focus of this group is to help patients review their daily goal of treatment and discuss progress on daily workbooks.  Participation Level: Did Not Attend  Participation Quality:  Not Applicable  Affect:  Not Applicable  Cognitive:  Not Applicable  Insight:  Not Applicable  Engagement in Group: Not Applicable  Additional Comments:  The patient did not attend group this evening.   [name] S 02/04/2021, 8:15 PM
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
## 2021-02-04 NOTE — [facility] Group Notes (Signed)
[facility] Group Notes:  (Nursing/MHT/Case Management/Adjunct)  Date:  02/04/2021  Time:  9:58 AM  Type of Therapy:  Group Therapy  Participation Level:  Active  Participation Quality:  Appropriate  Affect:  Appropriate  Cognitive:  Appropriate  Insight:  Appropriate  Engagement in Group:  Engaged  Modes of Intervention:  Orientation  Summary of Progress/Problems: Her goal for today is to take one thing at a time.   Orris Perin J Maddyx Vallie 02/04/2021, 9:58 AM

## 2021-02-04 NOTE — Plan of Care (Signed)
  Problem: Health Behavior/Discharge Planning: Goal: Compliance with prescribed medication regimen will improve Outcome: Progressing   Problem: Nutritional: Goal: Ability to achieve adequate nutritional intake will improve Outcome: Progressing   Problem: Role Relationship: Goal: Ability to communicate needs accurately will improve Outcome: Progressing   

## 2021-02-04 NOTE — Progress Notes (Signed)
Pt up complaining of increased anxiety and not sleeping well, pt given PRN Vistaril per [facility]

## 2021-02-04 NOTE — Progress Notes (Signed)
   02/04/21 0500  Sleep  Number of Hours 5.25
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
## 2021-02-04 NOTE — [facility] Group Notes (Signed)
Occupational Therapy Group Note Date: 02/04/2021 Group Topic/Focus: Self-Esteem  Group Description: Group encouraged increased engagement and participation through discussion and activity focused on self-esteem. Patients explored and discussed the differences between healthy and low self-esteem and how it affects our daily lives and occupations with a focus on relationships, work, school, self-care, and personal leisure interests. Group discussion then transitioned into identifying specific strategies to boost self-esteem and engaged in a collaborative and independent activity looking at positive ways to describe oneself A-Z.   Therapeutic Goal(s): Understand and recognize the differences between healthy and low self-esteem Identify healthy strategies to improve/build self-esteem Participation Level: Active   Participation Quality: Minimal Cues   Behavior: Alert and Interactive   Speech/Thought Process: Focused   Affect/Mood: Full range   Insight: Limited   Judgement: Limited   Individualization: Lindsey Prince was active in their participation of group discussion/activity with minimal cues needed from clinician. Pt appeared much improved from last week's session, less paranoia, however did have a brief period of irritability during group. Pt identified "going out in nature" and "exercise" as activities she likes to engage in to improve her self-esteem. She identified having good self-esteem currently. Engaged actively in group discussion identifying positive ways to describe self.    Modes of Intervention: Activity, Discussion, Education and Support  Patient Response to Interventions:  Attentive and Engaged   Plan: Continue to engage patient in OT groups 2 - 3x/week.  02/04/2021  Donne Hazel, MOT, OTR/L

## 2021-02-04 NOTE — Progress Notes (Signed)
Recreation Therapy Notes  Date: 6.2.22 Time: 1000 Location: 500 [address] Dayroom  Group Topic: Decision Making, Problem Solving, Communication  Goal Area(s) Addresses:  Patient will effectively work with peer towards shared goal.  Patient will identify factors that guided their decision making.  Patient will pro-socially communicate ideas during group session.   Behavioral Response: Engaged  Intervention: Survival Scenario - pencil, paper  Activity:  Patients were given Prince scenario that they were going to be stranded on [place] deserted [place] for several months before being rescued. Writer tasked them with making Prince list of 15 things they would choose to bring with them for "survival". The list of items was prioritized most important to least. Each patient would come up with their own list, then work together to create Prince new list of 15 items while in Prince group of 3-5 peers. LRT discussed each person's list and how it differed from others. The debrief included discussion of priorities, good decisions versus bad decisions, and how it is important to think before acting so we can make the best decision possible. LRT tied the concept of effective communication among group members to patient's support systems outside of the hospital and its benefit post discharge.  Education: Pharmacist, community, Priorities, Support System, Discharge Planning   Education Outcome: Acknowledges education/In group clarification/Needs additional education  Clinical Observations/Feedback: Pt was brighter and seemed happier.  Pt was active and expressed having Prince hard time constructing her list during the activity.  When put in Prince group, pt seemed to have more success coming up with ideas because she had others to work with.  Pt was happy with the decisions her group made.  Pt also expressed decisions impact your life one way or another.  Pt agreed with peer about being able to change your mind before making Prince decision.      Lindsey Prince, LRT/CTRS     Lindsey Prince 02/04/2021 12:50 PM

## 2021-02-05 LAB — URINALYSIS, COMPLETE (UACMP) WITH MICROSCOPIC
Bacteria, UA: NONE SEEN
Bilirubin Urine: NEGATIVE
Glucose, UA: NEGATIVE mg/dL
Ketones, ur: NEGATIVE mg/dL
Nitrite: NEGATIVE
Protein, ur: NEGATIVE mg/dL
Specific Gravity, Urine: 1.009 (ref 1.005–1.030)
pH: 8 (ref 5.0–8.0)

## 2021-02-05 MED ORDER — RISPERIDONE 2 MG PO TBDP
2.0000 mg | ORAL_TABLET | Freq: Every day | ORAL | Status: DC
  Administered 2021-02-05 – 2021-02-06 (×2): 2 mg via ORAL
  Filled 2021-02-05 (×5): qty 1

## 2021-02-05 MED ORDER — DIPHENHYDRAMINE HCL 50 MG/ML IJ SOLN: 25.0000 mg | Freq: Four times a day (QID) | INTRAMUSCULAR | Status: DC | PRN

## 2021-02-05 MED ORDER — BENZTROPINE MESYLATE 0.5 MG PO TABS
0.5000 mg | ORAL_TABLET | Freq: Four times a day (QID) | ORAL | Status: DC | PRN
  Filled 2021-02-05: qty 10

## 2021-02-05 NOTE — Plan of Care (Signed)
  Problem: Role Relationship: Goal: Ability to interact with others will improve Outcome: Progressing   Problem: Safety: Goal: Ability to remain free from injury will improve Outcome: Progressing   Problem: Self-Concept: Goal: Will verbalize positive feelings about self Outcome: Progressing
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
## 2021-02-05 NOTE — Progress Notes (Signed)
   D:  Patient presents with anxiety and depression rated at a level of (7/10).  Patient observed interacting with peers in dayroom.  Patient rates her day as (7.5/10). Patient reports mid-back pain rated at 8/10, (see MAR).  Patient denies SI/HI, but agrees to verbally contract with me and contact myself or staff should the thoughts arise.  Patient denies AH, but reports possible visual hallucinations stating, "I see flickers of light."    A:  Labs/Vitals monitored; Medication education provided; Patient supported emotionally; Patient asked to communicate her needs, concerns, and questions.  R:  Patient remains safe with 15 minute checks; Will continue POC.      02/04/21 2117  Psych Admission Type (Psych Patients Only)  Admission Status Voluntary  Psychosocial Assessment  Patient Complaints Anxiety;Depression  Eye Contact Fair  Facial Expression Animated;Anxious  Affect Anxious  Speech Logical/coherent  Interaction Assertive  Motor Activity Slow  Appearance/Hygiene Unremarkable  Behavior Characteristics Cooperative;Appropriate to situation;Anxious  Mood Anxious;Pleasant  Thought Process  Coherency Concrete thinking  Content Blaming others  Delusions None reported or observed  Perception WDL  Hallucination None reported or observed  Judgment Poor  Confusion None  Danger to Self  Current suicidal ideation? Denies  Danger to Others  Danger to Others None reported or observed

## 2021-02-05 NOTE — Progress Notes (Signed)
Recreation Therapy Notes  Date: 6.3.22 Time: 1000 Location: 500 [address] Dayroom  Group Topic: Communication, Team Building, Problem Solving  Goal Area(s) Addresses:  Patient will effectively work with peer towards shared goal.  Patient will identify skills used to make activity successful.  Patient will identify how skills used during activity can be applied to reach post d/c goals.   Behavioral Response: Engaged  Intervention: STEM Activity- Glass blower/designer  Activity: Tallest [employer]. In teams of 5-6, patients were given 11 craft pipe cleaners. Using the materials provided, patients were instructed to compete again the opposing team(s) to build the tallest free-standing structure from floor level. The activity was timed; difficulty increased by Clinical research associate as Production designer, theatre/television/film continued.  Systematically resources were removed with additional directions for example, placing one arm behind their back, working in silence, and shape stipulations. LRT facilitated post-activity discussion reviewing team processes and necessary communication skills involved in completion. Patients were encouraged to reflect how the skills utilized, or not utilized, in this activity can be incorporated to positively impact support systems post discharge.  Education: Pharmacist, community, Scientist, physiological, Discharge Planning   Education Outcome: Acknowledges education/In group clarification offered/Needs additional education.   Clinical Observations/Feedback: Pt was focused and took on the leader role in the group.  Pt would ask peers what they wanted to do to complete activity.  Pt would work through the suggestions of others in trying to see how the ideas would work out.  Pt expressed the activity being hard when people were in and out of group.  Pt was disappointed in herself.  Pt felt she could have done better.  LRT reminded pt that she took the leader role in the activity trying to get everyone to work together  and listening to each persons ideas.    Caroll Rancher, LRT/CTRS         Caroll Rancher A 02/05/2021 12:44 PM

## 2021-02-05 NOTE — [facility] Group Notes (Signed)
SPIRITUALITY GROUP NOTE   Spirituality group facilitated by Kathleen Argue, BCC.   Group Description: Group focused on topic of hope. Patients participated in facilitated discussion around topic, connecting with one another around experiences and definitions for hope. Group members engaged with visual explorer photos, reflecting on what hope looks like for them today. Group engaged in discussion around how their definitions of hope are present today in hospital.   Modalities: Psycho-social ed, Adlerian, Narrative, MI   Patient Progress: Lindsey Prince was engaged in group conversation and was able to acknowledge that she needs to focus on herself and on her healing.  She was able to identify some strengths and recognized that being vulnerable by sharing in group took courage and bravery.  Chaplain Dyanne Carrel, B[facility] Pager, [phone] 2:42 PM
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
## 2021-02-05 NOTE — Plan of Care (Signed)
  Problem: Coping: Goal: Ability to identify and develop effective coping behavior will improve Outcome: Progressing   Problem: Self-Concept: Goal: Ability to identify factors that promote anxiety will improve Outcome: Progressing Goal: Level of anxiety will decrease Outcome: Progressing   

## 2021-02-05 NOTE — Progress Notes (Signed)
Progress note    02/05/21 0900  Psych Admission Type (Psych Patients Only)  Admission Status Voluntary  Psychosocial Assessment  Patient Complaints Anxiety  Eye Contact Fair  Facial Expression Animated;Anxious  Affect Anxious  Speech Logical/coherent  Interaction Assertive  Motor Activity Slow  Appearance/Hygiene Unremarkable  Behavior Characteristics Cooperative;Appropriate to situation;Anxious  Mood Anxious;Pleasant  Thought Process  Coherency Concrete thinking  Content Blaming others  Delusions Controlled;Persecutory  Perception WDL  Hallucination None reported or observed  Judgment Poor  Confusion None  Danger to Self  Current suicidal ideation? Denies  Danger to Others  Danger to Others None reported or observed

## 2021-02-05 NOTE — [facility] Group Notes (Signed)
CSW Group Therapy Notes    Type of Therapy and Topic: Group Therapy: Effective Communication   Participation Level:  Active   Description of Group:  In this group patients will be asked to identify their own styles of communication as well as defining and identifying passive, assertive, and aggressive styles of communication. Participants will identify strategies to communicate in a more assertive manner in an effort to appropriately meet their needs. This group will be process-oriented, with patients participating in exploration of their own experiences as well as giving and receiving support and challenge from other group members.   Therapeutic Goals: 1. Patient will identify their personal communication style. 2. Patient will identify passive, assertive, and aggressive forms of communication. 3. Patient will identify strategies for developing more effective communication to appropriately meet their needs.    Summary of Patient Progress: Pt was able to ask questions and answer questions appropriately.    Therapeutic Modalities:  Communication Skills Solution Focused Therapy Motivational Interviewing   Aviela Blundell MSW, LCSW Clincal Social Worker  [facility]   

## 2021-02-05 NOTE — Progress Notes (Signed)
Adult Psychoeducational Group Note  Date:  02/05/2021 Time:  10:19 PM  Group Topic/Focus:  Wrap-Up Group:   The focus of this group is to help patients review their daily goal of treatment and discuss progress on daily workbooks.  Participation Level:  Minimal  Participation Quality:  Appropriate  Affect:  Appropriate  Cognitive:  Appropriate  Insight: Appropriate  Engagement in Group:  Engaged  Modes of Intervention:  Discussion  Additional Comments: The patient participated in AA Group on this date with volunteers on how to decrease dependence on relationships while beginning to meet own needs, build confidence, and practice assertiveness--discussed how to demonstrate healthy communication that is honest and self-disclosing. End of Wrap-Up progress report for the AA support group.             Nicoletta Dress 02/05/2021, 10:19 PM
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
## 2021-02-05 NOTE — Progress Notes (Addendum)
   02/05/21 2115  Psych Admission Type (Psych Patients Only)  Admission Status Voluntary  Psychosocial Assessment  Patient Complaints Anxiety;Depression  Eye Contact Fair  Facial Expression Animated;Anxious  Affect Anxious  Speech Logical/coherent  Interaction Assertive  Motor Activity Slow  Appearance/Hygiene Unremarkable  Behavior Characteristics Cooperative;Appropriate to situation  Mood Anxious;Pleasant  Thought Process  Coherency Concrete thinking  Content WDL  Delusions None reported or observed  Perception WDL  Hallucination None reported or observed  Judgment Poor  Confusion None  Danger to Self  Current suicidal ideation? Denies  Danger to Others  Danger to Others None reported or observed   Pt seen in the milieu. Pt denies SI, HI, AVH and pain. Pt rates anxiety 8/10 and depression 7/10. Pt said anxiety caused by the move from 500 hall to 400 hall today.

## 2021-02-06 NOTE — Progress Notes (Addendum)
   02/06/21 2100  Psych Admission Type (Psych Patients Only)  Admission Status Voluntary  Psychosocial Assessment  Patient Complaints None  Eye Contact Fair  Facial Expression Anxious  Affect Anxious  Speech Logical/coherent  Interaction Assertive  Motor Activity Slow  Appearance/Hygiene Unremarkable  Behavior Characteristics Cooperative;Appropriate to situation  Mood Pleasant  Thought Process  Coherency Concrete thinking  Content WDL  Delusions None reported or observed  Perception WDL  Hallucination None reported or observed  Judgment Poor  Confusion None  Danger to Self  Current suicidal ideation? Denies  Danger to Others  Danger to Others None reported or observed   Pt seen interacting in dayroom. Pt denies SI, HI, AVH. Rates pain 6/10 in her lower back but was already given pain medication. Denies anxiety and depression. Pt says she had a good day today. Excited for discharge early next week.

## 2021-02-06 NOTE — Progress Notes (Signed)
[facility] MD Progress Note  02/06/2021 2:29 PM Lindsey Prince  MRN:  [id number]   Reason for admission: [facility] presented to [facility] ED via [employer] after her boyfriend called stating she was exhibiting paranoid behavior, covering up reflective objects in her house and he was concerned for her safety as well as the safety of their child. Agreed to voluntary commitment at [facility] to build support and gain coping skills for difficult life situation. Admitted to [facility] for crisis stabilization and medication management.   Objective:  Chart reviewed, Nursing report received and care plan reviewed with members of our interdisciplinary team.  Patient during our encounter reported much improved mood.  She is also happy that she has insight in her mental illness.  Patient stated that she  Realizes that she has Bipolar disorder and as a mother to a 22 years old daughter she will take her medications and avoid using illicit drugs to maintain mental health stability.  She is no longer paranoid and stated today that staff and peers are friendly and supportive.  She is sleeping much better and she reported much better appetite.  Today she signed 72 hours form because she is ready to go home.  She plans to get a job and go back to school.  She denied feeling suicidal or homicidal.  She denied AVH and no mention of Homicidal ideation.  Discharge plan in progress. Principal Problem: Psychosis (HCC) Diagnosis: Principal Problem:   Psychosis (HCC) Active Problems:   PTSD (post-traumatic stress disorder)   Bipolar 1 disorder, depressed, severe (HCC)  Total Time spent with patient: 15 minutes  Past Psychiatric History: See admission H&P  Past Medical History:  Past Medical History:  Diagnosis Date  . Anemia   . Herpes     Past Surgical History:  Procedure Laterality Date  . NO PAST SURGERIES     Family History:  Family History  Problem Relation Age of Onset  . Healthy Neg Hx    Family Psychiatric  History: See admission  H&P Social History:  Social History   Substance and Sexual Activity  Alcohol Use No     Social History   Substance and Sexual Activity  Drug Use Yes  . Types: Marijuana    Social History   Socioeconomic History  . Marital status: Single    Spouse name: Not on file  . Number of children: Not on file  . Years of education: Not on file  . Highest education level: Not on file  Occupational History  . Not on file  Tobacco Use  . Smoking status: Current Every Day Smoker    Packs/day: 1.50    Types: Cigarettes  . Smokeless tobacco: Never Used  Substance and Sexual Activity  . Alcohol use: No  . Drug use: Yes    Types: Marijuana  . Sexual activity: Not Currently  Other Topics Concern  . Not on file  Social History Narrative  . Not on file   Social Determinants of Health   Financial Resource Strain: Not on file  Food Insecurity: Not on file  Transportation Needs: Not on file  Physical Activity: Not on file  Stress: Not on file  Social Connections: Not on file   Additional Social History:                         Sleep: Good  Appetite:  Good  Current Medications: Current Facility-Administered Medications  Medication Dose Route Frequency Provider Last Rate Last  Admin  . acetaminophen (TYLENOL) tablet 650 mg  650 mg Oral Q6H PRN Claudie Revering, MD   650 mg at 02/05/21 1603  . alum & mag hydroxide-simeth (MAALOX/MYLANTA) 200-200-20 MG/5ML suspension 30 mL  30 mL Oral Q4H PRN Estella Husk, MD   30 mL at 01/31/21 1821  . benztropine (COGENTIN) tablet 0.5 mg  0.5 mg Oral Q6H PRN Claudie Revering, MD      . carbamazepine (TEGRETOL) chewable tablet 100 mg  100 mg Oral BID Antonieta Pert, MD   100 mg at 02/06/21 0805  . diphenhydrAMINE (BENADRYL) injection 25 mg  25 mg Intramuscular Q6H PRN Claudie Revering, MD      . docusate sodium (COLACE) capsule 100 mg  100 mg Oral Daily Mason Jim, [name], MD   100 mg at 02/05/21 0754  . hydrOXYzine  (ATARAX/VISTARIL) tablet 25 mg  25 mg Oral TID PRN Estella Husk, MD   25 mg at 02/06/21 0805  . loratadine (CLARITIN) tablet 10 mg  10 mg Oral Daily Claudie Revering, MD   10 mg at 02/06/21 0805  . magnesium hydroxide (MILK OF MAGNESIA) suspension 30 mL  30 mL Oral Daily PRN Estella Husk, MD   30 mL at 01/29/21 2200  . propranolol (INDERAL) tablet 10 mg  10 mg Oral TID Claudie Revering, MD   10 mg at 02/06/21 0805  . risperiDONE (RISPERDAL M-TABS) disintegrating tablet 2 mg  2 mg Oral QHS Claudie Revering, MD   2 mg at 02/05/21 2115  . traZODone (DESYREL) tablet 100 mg  100 mg Oral QHS PRN Antonieta Pert, MD   100 mg at 02/05/21 2115    Lab Results:  Results for orders placed or performed during the hospital encounter of 01/25/21 (from the past 48 hour(s))  Urinalysis, Complete w Microscopic Urine, Unspecified Source     Status: Abnormal   Collection Time: 02/04/21  6:23 PM  Result Value Ref Range   Color, Urine YELLOW YELLOW   APPearance CLEAR CLEAR   Specific Gravity, Urine 1.009 1.005 - 1.030   pH 8.0 5.0 - 8.0   Glucose, UA NEGATIVE NEGATIVE mg/dL   Hgb urine dipstick MODERATE (A) NEGATIVE   Bilirubin Urine NEGATIVE NEGATIVE   Ketones, ur NEGATIVE NEGATIVE mg/dL   Protein, ur NEGATIVE NEGATIVE mg/dL   Nitrite NEGATIVE NEGATIVE   Leukocytes,Ua MODERATE (A) NEGATIVE   RBC / HPF 11-20 0 - 5 RBC/hpf   WBC, UA 6-10 0 - 5 WBC/hpf   Bacteria, UA NONE SEEN NONE SEEN   Squamous Epithelial / LPF 0-5 0 - 5    Comment: Performed at [facility], [address], [place], [place] [address]    Blood Alcohol level:  Lab Results  Component Value Date   [facility] <10 01/24/2021   ETH <10 06/14/2018    Metabolic Disorder Labs: Lab Results  Component Value Date   HGBA1C 5.4 01/24/2021   MPG 108.28 01/24/2021   No results found for: PROLACTIN Lab Results  Component Value Date   CHOL 145 01/24/2021   TRIG 125 01/24/2021   HDL 73 01/24/2021   CHOLHDL 2.0  01/24/2021   VLDL 25 01/24/2021   LDLCALC 47 01/24/2021    Physical Findings: AIMS: Facial and Oral Movements Muscles of Facial Expression: None, normal Lips and Perioral Area: None, normal Jaw: None, normal Tongue: None, normal,Extremity Movements Upper (arms, wrists, hands, fingers): None, normal Lower (legs, knees, ankles, toes): None, normal,  Trunk Movements Neck, shoulders, hips: None, normal, Overall Severity Severity of abnormal movements (highest score from questions above): None, normal Incapacitation due to abnormal movements: None, normal Patient's awareness of abnormal movements (rate only patient's report): No Awareness, Dental Status Current problems with teeth and/or dentures?: No Does patient usually wear dentures?: No  CIWA:    COWS:     Musculoskeletal: Strength & Muscle Tone: within normal limits Gait & Station: normal Patient leans: N/A  Psychiatric Specialty Exam:  Presentation  General Appearance: Fairly Groomed  Eye Contact:Good  Speech:Clear and Coherent; Normal Rate  Speech Volume:Normal  Handedness:Right   Mood and Affect  Mood:Anxious  Affect:Congruent; Other (comment) (Some appropriate brightening to certain topics)   Thought Process  Thought Processes:Coherent; Goal Directed  Descriptions of Associations:Tangential  Orientation:Full (Time, Place and Person)  Thought Content:Delusions; Paranoid Ideation  History of Schizophrenia/Schizoaffective disorder:No  Duration of Psychotic Symptoms:Less than six months  Hallucinations:Hallucinations: None  Ideas of Reference:Delusions; Paranoia  Suicidal Thoughts:Suicidal Thoughts: No  Homicidal Thoughts:Homicidal Thoughts: No   Sensorium  Memory:Immediate Fair; Recent Fair; Remote Fair  Judgment:Fair  Insight:Fair   Executive Functions  Concentration:Fair  Attention Span:Fair  Recall:Fair  Fund of Knowledge:Fair  Language:Good   Psychomotor Activity   Psychomotor Activity:Psychomotor Activity: Normal   Assets  Assets:Communication Skills; Desire for Improvement; Resilience; Social Support   Sleep  Sleep:Sleep: Fair Number of Hours of Sleep: 7    Physical Exam: Physical Exam Vitals and nursing note reviewed.  Constitutional:      General: She is not in acute distress. HENT:     Head: Normocephalic and atraumatic.  Pulmonary:     Effort: Pulmonary effort is normal.  Skin:    Comments: Rash is resolving  Neurological:     General: No focal deficit present.     Mental Status: She is alert and oriented to person, place, and time.    Review of Systems  Constitutional: Negative.   Respiratory: Negative.   Cardiovascular: Negative.   Gastrointestinal: Negative for constipation, diarrhea, nausea and vomiting.  Musculoskeletal: Positive for back pain.  Neurological: Negative for dizziness, tremors and seizures.  Psychiatric/Behavioral: Negative for hallucinations and suicidal ideas. The patient is nervous/anxious.    Blood pressure 108/83, pulse (!) 107, temperature (!) 97.5 F (36.4 C), temperature source Oral, resp. rate 16, height 5\' 2"  (1.575 m), weight 68 kg, SpO2 100 %. Body mass index is 27.44 kg/m.   Treatment Plan Summary: Daily contact with patient to assess and evaluate symptoms and progress in treatment and Medication management   Continue every 15-minute observation status  Encouraged participation in group therapy and therapeutic milieu  Bipolar disorder, current episode mixed with psychotic features -Discontinue Abilify 20 mg daily.   -Continue  Risperdal 2 mg nightly tonight for psychotic symptoms and mood stabilization.  Anticipate further upward titration as tolerated and indicated. -Continue Tegretol 100 mg twice daily for mood stabilization  Anxiety -Continue Vistaril 25 mg 3 times daily PRN -Continue propranolol 10mg  TID for anxiety, tachycardia  Back pain, moderate - Continue Tylenol  650mg  q6h PRN  Nasal congestion/seasonal allergies/pruritus - Continue Claritin 10mg  daily   Disposition planning in progress   , NP -PMHNP-BC

## 2021-02-06 NOTE — BHH Group Notes (Signed)
LCSW Group Therapy Note  02/06/2021    10:00-11:00am   Type of Therapy and Topic:  Group Therapy: Early Messages Received About Anger  Participation Level:  Did Not Attend   Description of Group:   In this group, patients shared and discussed the early messages received in their lives about anger through parental or other adult modeling, teaching, repression, punishment, violence, and more.  Participants identified how those childhood lessons influence even now how they usually or often react when angered.  The group discussed that anger is a secondary emotion and what may be the underlying emotional themes that come out through anger outbursts or that are ignored through anger suppression.    Therapeutic Goals: Patients will identify one or more childhood message about anger that they received and how it was taught to them. Patients will discuss how these childhood experiences have influenced and continue to influence their own expression or repression of anger even today. Patients will explore possible primary emotions that tend to fuel their secondary emotion of anger. Patients will learn that anger itself is normal and cannot be eliminated, and that healthier coping skills can assist with resolving conflict rather than worsening situations.  Summary of Patient Progress:  Did not attend  Therapeutic Modalities:   Cognitive Behavioral Therapy Motivation Interviewing  Alasdair Kleve J Grossman-Orr  .  

## 2021-02-07 MED ORDER — HYDROXYZINE HCL 25 MG PO TABS: 25.0000 mg | ORAL_TABLET | Freq: Three times a day (TID) | ORAL | 0 refills | Status: AC | PRN

## 2021-02-07 MED ORDER — PROPRANOLOL HCL 10 MG PO TABS: 10.0000 mg | ORAL_TABLET | Freq: Three times a day (TID) | ORAL | 0 refills | Status: DC

## 2021-02-07 MED ORDER — RISPERIDONE 2 MG PO TBDP: 2.0000 mg | ORAL_TABLET | Freq: Every day | ORAL | 0 refills | Status: DC

## 2021-02-07 MED ORDER — CARBAMAZEPINE 100 MG PO CHEW: 100.0000 mg | CHEWABLE_TABLET | Freq: Two times a day (BID) | ORAL | 0 refills | Status: DC

## 2021-02-07 MED ORDER — LORATADINE 10 MG PO TABS: 10.0000 mg | ORAL_TABLET | Freq: Every day | ORAL | 0 refills | Status: DC

## 2021-02-07 MED ORDER — BENZTROPINE MESYLATE 0.5 MG PO TABS: 0.5000 mg | ORAL_TABLET | Freq: Two times a day (BID) | ORAL | 0 refills | Status: DC | PRN

## 2021-02-07 MED ORDER — TRAZODONE HCL 100 MG PO TABS: 100.0000 mg | ORAL_TABLET | Freq: Every evening | ORAL | 0 refills | Status: DC | PRN

## 2021-02-07 NOTE — BHH Group Notes (Signed)
BHH LCSW Group Therapy Note  02/07/2021    Type of Therapy and Topic:  Group Therapy:  A Hero Worthy of Support  Participation Level:  Active   Description of Group:  Patients in this group were introduced to the concept that additional supports including self-support are an essential part of recovery.  Matching needs with supports to help fulfill those needs was explained.  Establishing boundaries that can gradually be increased or decreased was described, with patients giving their own examples of establishing appropriate boundaries in their lives.  A song entitled "My Own Hero" was played and a group discussion ensued in which patients stated it inspired them to help themselves in order to succeed, because other people cannot achieve their goals such as sobriety or stability for them.  A song was played called "I Am Enough" which led to a discussion about being willing to believe we are worth the effort of being a self-support.   Therapeutic Goals: 1)  demonstrate the importance of being a key part of one's own support system 2)  discuss various available supports 3)  encourage patient to use music as part of their self-support and focus on goals 4)  elicit ideas from patients about supports that need to be added   Summary of Patient Progress:  The patient expressed that the types of support she needs to add at hospital discharge are therapy and peers.  She asked specifically for an explanation in the differences between a psychiatrist and psychologist, and then state what she needs is a psychologist or other type of therapist.  Therapeutic Modalities:   Motivational Interviewing Activity  Lynnell Chad

## 2021-02-07 NOTE — Progress Notes (Signed)
D:  Patient's self inventory sheet, patient sleeps good, sleep medication helpful.  Good appetite, normal energy level, good concentration.  Rated depression, hopeless and anxiety #1.  Denied withdrawals.  Denied SI.  Denied physical problems.  Denied physical pain.  Goal is take one thing at a time.  Plans to focus on here and now and not rush.  Does have discharge plans. A:  Medications administered per MD orders.  Emotional support and encouragement given patient. R:  Patient denied SI and HI, contracts for safety.  Denied A/V hallucinations.  Safety maintained with 15 minute checks.

## 2021-02-07 NOTE — BHH Group Notes (Signed)
Adult Psychoeducational Group Note  Date:  02/07/2021 Time:  10:40 AM  Group Topic/Focus:  Goals Group:   The focus of this group is to help patients establish daily goals to achieve during treatment and discuss how the patient can incorporate goal setting into their daily lives to aide in recovery.  Participation Level:  Active  Participation Quality:  Appropriate  Affect:  Appropriate  Cognitive:  Appropriate  Insight: Appropriate  Engagement in Group:  Engaged  Modes of Intervention:  Education  Additional Comments:  Pt goal today is to take one thing at a time.Pt has no feelings of wanting to hurt himself or others.  Viyan Rosamond, Sharen Counter 02/07/2021, 10:40 AM

## 2021-02-07 NOTE — Progress Notes (Signed)
  [facility] Adult Case Management Discharge Plan :  Will you be returning to the same living situation after discharge:  Yes,  with child and the child's father At discharge, do you have transportation home?: Yes,  child's father Do you have the ability to pay for your medications: No.  Will need assistance from community organization  Release of information consent forms completed and emailed to Medical Records, then turned in to Medical Records by CSW.   Patient to Follow up at:  Follow-up Information    [facility] Go on 02/05/2021.   Specialty: Behavioral Health Why: You have an appointment on 02/05/21 at 12:00 pm for therapy services.  You also have an appointment for medication management on 02/22/21 at 7:45 am.  The appointments are held in person and are first come, first served.  Contact information: [address] [place] [address] [phone]              Next level of care provider has access to [facility] Link:yes  Safety Planning and Suicide Prevention discussed: No.  Discharge was discussed with child's father, however, and he expressed that he is familiar with suicide prevention protocols     Has patient been referred to the Quitline?: N/A patient is not a smoker  Patient has been referred for addiction treatment: Yes  Lynnell Chad, LCSW 02/07/2021, 12:57 PM

## 2021-02-07 NOTE — Plan of Care (Signed)
Nurse discussed coping skills with patient.  

## 2021-02-07 NOTE — BHH Suicide Risk Assessment (Signed)
[facility] Discharge Suicide Risk Assessment   Principal Problem: Psychosis [facility]) Discharge Diagnoses: Principal Problem:   Psychosis (HCC) Active Problems:   PTSD (post-traumatic stress disorder)   Bipolar 1 disorder, depressed, severe (HCC)   Total Time spent with patient: 20 minutes  Musculoskeletal: Strength & Muscle Tone: within normal limits Gait & Station: normal Patient leans: N/A  Psychiatric Specialty Exam  Presentation  General Appearance: Appropriate for Environment  Eye Contact:Good  Speech:Clear and Coherent; Normal Rate  Speech Volume:Normal  Handedness:Right   Mood and Affect  Mood:Euthymic  Duration of Depression Symptoms: Greater than two weeks  Affect:Appropriate   Thought Process  Thought Processes:Coherent  Descriptions of Associations:Intact  Orientation:Full (Time, Place and Person)  Thought Content:Delusions; Paranoid Ideation  History of Schizophrenia/Schizoaffective disorder:No  Duration of Psychotic Symptoms:Less than six months  Hallucinations:Hallucinations: None  Ideas of Reference:Delusions; Paranoia  Suicidal Thoughts:Suicidal Thoughts: No  Homicidal Thoughts:Homicidal Thoughts: No   Sensorium  Memory:Immediate Good; Recent Good; Remote Good  Judgment:Good  Insight:Fair   Executive Functions  Concentration:Good  Attention Span:Good  Recall:Good  Fund of Knowledge:Good  Language:Good   Psychomotor Activity  Psychomotor Activity:Psychomotor Activity: Normal   Assets  Assets:Desire for Improvement; Housing; Resilience   Sleep  Sleep:Sleep: Good Number of Hours of Sleep: 6.75   Physical Exam: Physical Exam Vitals and nursing note reviewed.  Constitutional:      Appearance: Normal appearance.  HENT:     Head: Normocephalic and atraumatic.  Pulmonary:     Effort: Pulmonary effort is normal.  Neurological:     General: No focal deficit present.     Mental Status: She is alert and oriented to  person, place, and time.    Review of Systems  All other systems reviewed and are negative.  Blood pressure 104/77, pulse 98, temperature (!) 97.5 F (36.4 C), temperature source Oral, resp. rate 16, height 5\' 2"  (1.575 m), weight 68 kg, SpO2 99 %. Body mass index is 27.44 kg/m.  Mental Status Per Nursing Assessment::   On Admission:  NA  Demographic Factors:  Caucasian, Low socioeconomic status and Unemployed  Loss Factors: NA  Historical Factors: Impulsivity  Risk Reduction Factors:   Responsible for children under 33 years of age, Living with another person, especially a relative and Positive social support  Continued Clinical Symptoms:  Bipolar Disorder:   Mixed State  Cognitive Features That Contribute To Risk:  None    Suicide Risk:  Minimal: No identifiable suicidal ideation.  Patients presenting with no risk factors but with morbid ruminations; may be classified as minimal risk based on the severity of the depressive symptoms   Follow-up Information    [facility]. Go on 02/05/2021.   Specialty: Behavioral Health Why: You have an appointment on 02/05/21 at 12:00 pm for therapy services.  You also have an appointment for medication management on 02/22/21 at 7:45 am.  The appointments are held in person and are first come, first served.  Contact information: [address] [place] [phone]              Plan Of Care/Follow-up recommendations:  Activity:  ad lib  [phone], MD 02/07/2021, 10:09 AM

## 2021-02-07 NOTE — BHH Counselor (Signed)
Clinical Social Work Note  With written consent, CSW spoke with patient's child's father (she insists they are co-parenting and not in relationship) Inez Pilgrim [phone].  He was aware of her impending discharge, and stated he was 100% okay with it.  He feels she is ready to discharge, will come pick her up at 1:00pm.  Ambrose Mantle, LCSW 02/07/2021, 9:51 AM

## 2021-02-07 NOTE — Progress Notes (Signed)
Discharge Note:  Patient discharged home with boyfriend.  Suicide prevention information given and discussed with patient who stated she understood and had no questions.  Patient denied SI and HI, contracts for safety.  Denied  A/V hallucinations.  Patient stated she received all her belongings, clothing, toiletries, misc items, etc.  Patient stated she appreciated all assistance received from [facility] staff.  All required discharge information given to patient.
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
## 2021-11-06 ENCOUNTER — Other Ambulatory Visit: Payer: Self-pay

## 2021-11-06 ENCOUNTER — Emergency Department (HOSPITAL_COMMUNITY)
Admission: EM | Admit: 2021-11-06 | Discharge: 2021-11-08 | Disposition: A | Discharge status: Other Acute Inpatient Hospital | Payer: Self-pay | Attending: Emergency Medicine | Admitting: Emergency Medicine

## 2021-11-06 ENCOUNTER — Encounter (HOSPITAL_COMMUNITY): Payer: Self-pay | Admitting: Emergency Medicine

## 2021-11-06 DIAGNOSIS — M549 Dorsalgia, unspecified: Secondary | ICD-10-CM | POA: Insufficient documentation

## 2021-11-06 DIAGNOSIS — R000 Tachycardia, unspecified: Secondary | ICD-10-CM | POA: Insufficient documentation

## 2021-11-06 DIAGNOSIS — Z20822 Contact with and (suspected) exposure to covid-19: Secondary | ICD-10-CM | POA: Insufficient documentation

## 2021-11-06 DIAGNOSIS — F315 Bipolar disorder, current episode depressed, severe, with psychotic features: Secondary | ICD-10-CM | POA: Insufficient documentation

## 2021-11-06 DIAGNOSIS — Y900 Blood alcohol level of less than 20 mg/100 ml: Secondary | ICD-10-CM | POA: Insufficient documentation

## 2021-11-06 DIAGNOSIS — R443 Hallucinations, unspecified: Secondary | ICD-10-CM

## 2021-11-06 DIAGNOSIS — A599 Trichomoniasis, unspecified: Secondary | ICD-10-CM

## 2021-11-06 LAB — COMPREHENSIVE METABOLIC PANEL
ALT: 15 U/L (ref 0–44)
AST: 15 U/L (ref 15–41)
Albumin: 4.4 g/dL (ref 3.5–5.0)
Alkaline Phosphatase: 49 U/L (ref 38–126)
Anion gap: 12 (ref 5–15)
BUN: 13 mg/dL (ref 6–20)
CO2: 22 mmol/L (ref 22–32)
Calcium: 9.1 mg/dL (ref 8.9–10.3)
Chloride: 102 mmol/L (ref 98–111)
Creatinine, Ser: 0.84 mg/dL (ref 0.44–1.00)
GFR, Estimated: >60 mL/min (ref >60)
Glucose, Bld: 118 mg/dL — ABNORMAL HIGH (ref 70–99)
Potassium: 3.4 mmol/L — ABNORMAL LOW (ref 3.5–5.1)
Sodium: 136 mmol/L (ref 135–145)
Total Bilirubin: 0.4 mg/dL (ref 0.3–1.2)
Total Protein: 7.3 g/dL (ref 6.5–8.1)

## 2021-11-06 LAB — RAPID URINE DRUG SCREEN, HOSP PERFORMED
Amphetamines: NOT DETECTED
Barbiturates: NOT DETECTED
Benzodiazepines: NOT DETECTED
Cocaine: NOT DETECTED
Opiates: NOT DETECTED
Tetrahydrocannabinol: POSITIVE — AB

## 2021-11-06 LAB — CBC WITH DIFFERENTIAL/PLATELET
Abs Immature Granulocytes: 0.04 10*3/uL (ref 0.00–0.07)
Basophils Absolute: 0.1 10*3/uL (ref 0.0–0.1)
Basophils Relative: 1 %
Eosinophils Absolute: 0.2 10*3/uL (ref 0.0–0.5)
Eosinophils Relative: 1 %
HCT: 40.1 % (ref 36.0–46.0)
Hemoglobin: 13.8 g/dL (ref 12.0–15.0)
Immature Granulocytes: 0 %
Lymphocytes Relative: 26 %
Lymphs Abs: 3.9 10*3/uL (ref 0.7–4.0)
MCH: 32.2 pg (ref 26.0–34.0)
MCHC: 34.4 g/dL (ref 30.0–36.0)
MCV: 93.7 fL (ref 80.0–100.0)
Monocytes Absolute: 0.9 10*3/uL (ref 0.1–1.0)
Monocytes Relative: 6 %
Neutro Abs: 9.9 10*3/uL — ABNORMAL HIGH (ref 1.7–7.7)
Neutrophils Relative %: 66 %
Platelets: 453 10*3/uL — ABNORMAL HIGH (ref 150–400)
RBC: 4.28 MIL/uL (ref 3.87–5.11)
RDW: 12.4 % (ref 11.5–15.5)
WBC: 15 10*3/uL — ABNORMAL HIGH (ref 4.0–10.5)
nRBC: 0 % (ref 0.0–0.2)

## 2021-11-06 LAB — I-STAT BETA HCG BLOOD, ED (MC, WL, AP ONLY): I-stat hCG, quantitative: <5 m[IU]/mL (ref <5)

## 2021-11-06 LAB — ETHANOL: Alcohol, Ethyl (B): <10 mg/dL (ref <10)

## 2021-11-06 LAB — RESP PANEL BY RT-PCR (FLU A&B, COVID) ARPGX2
Influenza A by PCR: NEGATIVE
Influenza B by PCR: NEGATIVE
SARS Coronavirus 2 by RT PCR: NEGATIVE

## 2021-11-06 LAB — SALICYLATE LEVEL: Salicylate Lvl: <7 mg/dL — ABNORMAL LOW (ref 7.0–30.0)

## 2021-11-06 LAB — ACETAMINOPHEN LEVEL: Acetaminophen (Tylenol), Serum: <10 ug/mL — ABNORMAL LOW (ref 10–30)

## 2021-11-06 MED ORDER — ACETAMINOPHEN 325 MG PO TABS: 650.0000 mg | ORAL_TABLET | Freq: Four times a day (QID) | ORAL | Status: DC | PRN

## 2021-11-06 NOTE — ED Triage Notes (Signed)
Pt reports back pain that started in her arm and is now affecting her back.  Pt seems to be having trouble composing her thoughts.  Denies other symptoms.  Pt admits to anxiety as well.  Reports "the voices in my head...theres a lot."  Denies any SI/HI.   ?

## 2021-11-06 NOTE — ED Provider Notes (Signed)
?[facility]-EMERGENCY DEPT ?[facility] Emergency Department ?Provider Note ?MRN:  [id number]  ?Arrival date & time: 11/07/21    ? ?Chief Complaint   ?Back Pain and Hallucinations ?  ?History of Present Illness   ?Lindsey Prince is a 36 y.o. year-old female presents to the ED with chief complaint of anxiety and hallucinations.  She reports that she has been hearing voices.  She is not on any meds.  ? ?Additionally, patient reports back pain (all over) for years.  She denies injuries.  She states that she has been told that she needs to follow-up with a back specialist. She states that she is here for her follow-up. ? ?History provided by patient. ? ? ?Review of Systems  ?Pertinent review of systems noted in HPI.  ? ? ?Physical Exam  ? ?Vitals:  ? 11/06/21 2151 11/07/21 0246  ?BP: (!) 142/85 119/86  ?Pulse: (!) 126 91  ?Resp: 16 17  ?Temp: 98.1 ?F (36.7 ?C) 98 ?F (36.7 ?C)  ?SpO2: 98% 99%  ?  ?CONSTITUTIONAL:  nontoxic-appearing, NAD ?NEURO:  Alert and oriented x 3, CN 3-12 grossly intact ?EYES:  eyes equal and reactive ?ENT/NECK:  Supple, no stridor  ?CARDIO:  tachycardic, regular rhythm, appears well-perfused  ?PULM:  No respiratory distress,  ?GI/GU:  non-distended,  ?MSK/SPINE:  No gross deformities, no edema, moves all extremities  ?SKIN:  no rash, atraumatic ? ? ?*Additional and/or pertinent findings included in MDM below ? ?Diagnostic and Interventional Summary  ? ? EKG Interpretation ? ?Date/Time:    ?Ventricular Rate:    ?PR Interval:    ?QRS Duration:   ?QT Interval:    ?QTC Calculation:   ?R Axis:     ?Text Interpretation:   ?  ? ?  ? ?Labs Reviewed  ?COMPREHENSIVE METABOLIC PANEL - Abnormal; Notable for the following components:  ?    Result Value  ? Potassium 3.4 (*)   ? Glucose, Bld 118 (*)   ? All other components within normal limits  ?RAPID URINE DRUG SCREEN, HOSP PERFORMED - Abnormal; Notable for the following components:  ? Tetrahydrocannabinol POSITIVE (*)   ? All other components within normal  limits  ?CBC WITH DIFFERENTIAL/PLATELET - Abnormal; Notable for the following components:  ? WBC 15.0 (*)   ? Platelets 453 (*)   ? Neutro Abs 9.9 (*)   ? All other components within normal limits  ?ACETAMINOPHEN LEVEL - Abnormal; Notable for the following components:  ? Acetaminophen (Tylenol), Serum <10 (*)   ? All other components within normal limits  ?SALICYLATE LEVEL - Abnormal; Notable for the following components:  ? Salicylate Lvl <7.0 (*)   ? All other components within normal limits  ?URINALYSIS, ROUTINE W REFLEX MICROSCOPIC - Abnormal; Notable for the following components:  ? APPearance HAZY (*)   ? Ketones, ur 5 (*)   ? Leukocytes,Ua LARGE (*)   ? Bacteria, UA FEW (*)   ? Trichomonas, UA PRESENT (*)   ? All other components within normal limits  ?RESP PANEL BY RT-PCR (FLU A&B, COVID) ARPGX2  ?ETHANOL  ?I-STAT BETA HCG BLOOD, ED ([facility], [facility], [facility] ONLY)  ?  ?No orders to display  ?  ?Medications  ?acetaminophen (TYLENOL) tablet 650 mg (has no administration in time range)  ?metroNIDAZOLE (FLAGYL) tablet 500 mg (has no administration in time range)  ?metroNIDAZOLE (FLAGYL) tablet 500 mg (500 mg Oral Given 11/07/21 0243)  ?  ? ?Procedures  /  Critical Care ?Procedures ? ?ED Course and Medical Decision  Making  ?I have reviewed the triage vital signs, the nursing notes, and pertinent available records from the EMR. ? ?Complexity of Problems Addressed: ?Moderate Complexity: Acute complicated illness or injury, requiring diagnostic workup as ordered and performed below. ?Comorbidities affecting this illness/injury include: ?Psychosis, bipolar 1, PTSD ?Social Determinants Affecting Care: ? ? ? ?ED Course: ?After considering the following differential, back pain, UTI, KS, pyelo, chronic pain, anxiety, I agree with labs ordered in triage. ?I personally interpreted the labs which are notable for trichomonas in UA . ? ?  ? ?Consultants: ?TTS recommends inpatient psychiatric admission. ? ?Treatment and Plan: ?Treat  trichomonas with flagyl, admit for inpatient psychiatric treatment. ? ? ? ? ? ?Final Clinical Impressions(s) / ED Diagnoses  ? ?  ICD-10-CM   ?1. Hallucinations  R44.3   ?  ?2. Trichomonas infection  A59.9   ?  ?  ?ED Discharge Orders   ? ? None  ? ?  ?  ? ? ?Discharge Instructions Discussed with and Provided to Patient:  ? ? ? ?Discharge Instructions   ? ?  ?You were noted to have trichomonas, a sexually transmitted infection, and your urine.  Please take antibiotics as directed. ? ? ? ? ?  ?Roxy Horseman, PA-C ?11/07/21 0413 ? ?  ?Zadie Rhine, MD ?11/07/21 0421 ? ?

## 2021-11-06 NOTE — ED Provider Triage Note (Signed)
Emergency Medicine Provider Triage Evaluation Note ? ?Joycelyn Rua , a 36 y.o. female with a history of severe bipolar disorder, PTSD, psychosis was evaluated in triage.  Pt initially presented due to upper back pain.  States that this has been ongoing for years.  No new trauma.  States that she will occasionally get tingling in the left arm. ? ?Patient appeared to be responding to internal stimuli and after further conversation she notes that she is also experiencing auditory hallucinations.  States that she is not taking any regular medications.  Denies any SI/HI but states that the "voices are worse than normal" and appears to be in significant distress from this. ? ?Physical Exam  ?BP (!) 142/85   Pulse (!) 126   Temp 98.1 ?F (36.7 ?C) (Oral)   Resp 16   Ht 5\' 2"  (1.575 m)   SpO2 98%   BMI 27.44 kg/m?  ?Gen:   Awake, tearful ?Resp:  Normal effort  ?MSK:   Moves extremities without difficulty  ?Other:  No palpable pain along the midline spine or across the back.  Appears to be responding to internal stimuli.  Tearful.  Difficulty answering questions.  No SI/HI. ? ?Medical Decision Making  ?Medically screening exam initiated at 10:01 PM.  Appropriate orders placed.  PERLIE OHLMANN was informed that the remainder of the evaluation will be completed by another provider, this initial triage assessment does not replace that evaluation, and the importance of remaining in the ED until their evaluation is complete. ?  ?Rayna Sexton, PA-C ?11/06/21 2203 ? ?

## 2021-11-07 ENCOUNTER — Encounter (HOSPITAL_COMMUNITY): Payer: Self-pay

## 2021-11-07 LAB — URINALYSIS, ROUTINE W REFLEX MICROSCOPIC
Bilirubin Urine: NEGATIVE
Glucose, UA: NEGATIVE mg/dL
Hgb urine dipstick: NEGATIVE
Ketones, ur: 5 mg/dL — AB
Nitrite: NEGATIVE
Protein, ur: NEGATIVE mg/dL
Specific Gravity, Urine: 1.026 (ref 1.005–1.030)
pH: 5 (ref 5.0–8.0)

## 2021-11-07 MED ORDER — RISPERIDONE 1 MG PO TBDP
2.0000 mg | ORAL_TABLET | Freq: Every day | ORAL | Status: DC
  Administered 2021-11-07: 2 mg via ORAL
  Filled 2021-11-07 (×2): qty 2

## 2021-11-07 MED ORDER — METRONIDAZOLE 500 MG PO TABS
500.0000 mg | ORAL_TABLET | Freq: Once | ORAL | Status: AC
Start: 2021-11-07 — End: 2021-11-07
  Administered 2021-11-07: 500 mg via ORAL
  Filled 2021-11-07: qty 1

## 2021-11-07 MED ORDER — BENZTROPINE MESYLATE 0.5 MG PO TABS
0.5000 mg | ORAL_TABLET | Freq: Two times a day (BID) | ORAL | Status: DC | PRN
  Filled 2021-11-07: qty 1

## 2021-11-07 MED ORDER — CARBAMAZEPINE 100 MG PO CHEW
100.0000 mg | CHEWABLE_TABLET | Freq: Two times a day (BID) | ORAL | Status: DC
  Administered 2021-11-07 – 2021-11-08 (×3): 100 mg via ORAL
  Filled 2021-11-07 (×3): qty 1

## 2021-11-07 MED ORDER — PROPRANOLOL HCL 10 MG PO TABS
10.0000 mg | ORAL_TABLET | Freq: Three times a day (TID) | ORAL | Status: DC
  Administered 2021-11-07 – 2021-11-08 (×4): 10 mg via ORAL
  Filled 2021-11-07 (×6): qty 1

## 2021-11-07 MED ORDER — METRONIDAZOLE 500 MG PO TABS
500.0000 mg | ORAL_TABLET | Freq: Two times a day (BID) | ORAL | Status: DC
Start: 2021-11-07 — End: 2021-11-08
  Administered 2021-11-07 – 2021-11-08 (×3): 500 mg via ORAL
  Filled 2021-11-07 (×3): qty 1

## 2021-11-07 NOTE — ED Notes (Signed)
Dinner ordered--Regular Diet ?

## 2021-11-07 NOTE — ED Notes (Addendum)
Pt belongings inventoried (see pt belongings assessment) 1 bag of belongings placed in locker #1, 1 valuables envelope sent to security. Pt wanded by security. Pt already in purple scrubs prior to this RN's shift. [place] folder filled out. ?

## 2021-11-07 NOTE — ED Notes (Signed)
Pillow provided to pt
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
## 2021-11-07 NOTE — ED Notes (Signed)
TTS in progress 

## 2021-11-07 NOTE — ED Notes (Signed)
Lunch tray delivered to pt at this time.

## 2021-11-07 NOTE — Discharge Instructions (Signed)
You were noted to have trichomonas, a sexually transmitted infection, and your urine.  Please take antibiotics as directed. ?

## 2021-11-07 NOTE — Progress Notes (Signed)
Per Teodora Medici, patient meets criteria for inpatient treatment. There are no available or appropriate beds at [facility] today. CSW faxed referrals to the following facilities for review: ? ?[facility]  Pending - No Request Sent N/A [address], [place] [address] [phone] --  ?[facility]  Pending - No Request Sent N/A [address], [place] [address] [phone] --  ?[facility]  Pending - No Request [address] [place] [address] [phone] --  ?[facility]  Pending - No Request Sent N/A [address] [place] [address] [phone] --  ?[facility]  Pending - No Request Sent N/A [address] [place] [address] [phone] --  ?[facility]  Pending - No Request Sent N/A [address] [place] [address] [phone] --  ?[facility]  Pending - No Request Sent N/A [address], [place] [address] [phone] --  ?[facility]  Pending - No Request Sent N/A [address] [place] [address] [phone] --  ?[facility]  Pending - No Request Sent N/A [address] [place] [address] [phone] --  ?[facility]  Pending - No Request Sent N/A [address], [place] [address] [phone] --  ?[facility]  Pending - No Request Sent N/A [address] [place] [address] [phone] --  ?[facility]  Pending - No Request Sent N/A [address] [place]olee [address]s., [place] [address] [phone] --  ?[facility]  Pending - No Request Sent N/A [address] [place] [address] [phone] --  ?[facility]  Pending - No Request Sent N/A [address] [place] [address] [phone] --  ? ? ?TTS will continue to seek bed placement. ? ?Crissie Reese, MSW, LCSW-A, LCAS-A ?Phone: [phone] ?Disposition/TOC ? ?

## 2021-11-07 NOTE — ED Notes (Signed)
Pt ate 100% meal tray 

## 2021-11-08 ENCOUNTER — Encounter (HOSPITAL_COMMUNITY): Payer: Self-pay | Admitting: Registered Nurse

## 2021-11-08 ENCOUNTER — Inpatient Hospital Stay (HOSPITAL_COMMUNITY)
Admission: AD | Admit: 2021-11-08 | Discharge: 2021-11-25 | DRG: 885 | Disposition: A | Discharge status: Home / Self Care | Payer: Federal, State, Local not specified - Other | Source: Intra-hospital | Attending: Emergency Medicine | Admitting: Emergency Medicine

## 2021-11-08 ENCOUNTER — Other Ambulatory Visit: Payer: Self-pay

## 2021-11-08 DIAGNOSIS — F4310 Post-traumatic stress disorder, unspecified: Secondary | ICD-10-CM | POA: Diagnosis present

## 2021-11-08 DIAGNOSIS — M549 Dorsalgia, unspecified: Secondary | ICD-10-CM | POA: Diagnosis present

## 2021-11-08 DIAGNOSIS — Z79899 Other long term (current) drug therapy: Secondary | ICD-10-CM | POA: Diagnosis not present

## 2021-11-08 DIAGNOSIS — A599 Trichomoniasis, unspecified: Secondary | ICD-10-CM | POA: Diagnosis present

## 2021-11-08 DIAGNOSIS — F3164 Bipolar disorder, current episode mixed, severe, with psychotic features: Secondary | ICD-10-CM | POA: Diagnosis present

## 2021-11-08 DIAGNOSIS — F319 Bipolar disorder, unspecified: Secondary | ICD-10-CM | POA: Diagnosis not present

## 2021-11-08 DIAGNOSIS — D75839 Thrombocytosis, unspecified: Secondary | ICD-10-CM | POA: Diagnosis present

## 2021-11-08 DIAGNOSIS — Z885 Allergy status to narcotic agent status: Secondary | ICD-10-CM

## 2021-11-08 DIAGNOSIS — F312 Bipolar disorder, current episode manic severe with psychotic features: Secondary | ICD-10-CM | POA: Diagnosis present

## 2021-11-08 DIAGNOSIS — Z5900 Homelessness unspecified: Secondary | ICD-10-CM | POA: Diagnosis not present

## 2021-11-08 DIAGNOSIS — Z811 Family history of alcohol abuse and dependence: Secondary | ICD-10-CM

## 2021-11-08 DIAGNOSIS — E781 Pure hyperglyceridemia: Secondary | ICD-10-CM | POA: Diagnosis present

## 2021-11-08 DIAGNOSIS — F17210 Nicotine dependence, cigarettes, uncomplicated: Secondary | ICD-10-CM | POA: Diagnosis present

## 2021-11-08 DIAGNOSIS — F314 Bipolar disorder, current episode depressed, severe, without psychotic features: Principal | ICD-10-CM | POA: Diagnosis present

## 2021-11-08 DIAGNOSIS — F1210 Cannabis abuse, uncomplicated: Secondary | ICD-10-CM | POA: Diagnosis present

## 2021-11-08 DIAGNOSIS — Z20822 Contact with and (suspected) exposure to covid-19: Secondary | ICD-10-CM | POA: Diagnosis present

## 2021-11-08 DIAGNOSIS — Z23 Encounter for immunization: Secondary | ICD-10-CM | POA: Diagnosis not present

## 2021-11-08 MED ORDER — BENZTROPINE MESYLATE 0.5 MG PO TABS
0.5000 mg | ORAL_TABLET | Freq: Two times a day (BID) | ORAL | Status: DC | PRN
  Filled 2021-11-08: qty 14

## 2021-11-08 MED ORDER — RISPERIDONE 2 MG PO TBDP
2.0000 mg | ORAL_TABLET | Freq: Every day | ORAL | Status: DC
  Administered 2021-11-08 – 2021-11-11 (×3): 2 mg via ORAL
  Filled 2021-11-08 (×7): qty 1

## 2021-11-08 MED ORDER — ALUM & MAG HYDROXIDE-SIMETH 200-200-20 MG/5ML PO SUSP
30.0000 mL | ORAL | Status: DC | PRN
  Administered 2021-11-14 – 2021-11-18 (×2): 30 mL via ORAL
  Filled 2021-11-08 (×2): qty 30

## 2021-11-08 MED ORDER — MAGNESIUM HYDROXIDE 400 MG/5ML PO SUSP: 30.0000 mL | Freq: Every day | ORAL | Status: DC | PRN

## 2021-11-08 MED ORDER — METRONIDAZOLE 250 MG PO TABS
500.0000 mg | ORAL_TABLET | Freq: Two times a day (BID) | ORAL | Status: AC
  Administered 2021-11-08 – 2021-11-15 (×13): 500 mg via ORAL
  Filled 2021-11-08 (×2): qty 2
  Filled 2021-11-08: qty 1
  Filled 2021-11-08: qty 2
  Filled 2021-11-08: qty 1
  Filled 2021-11-08 (×10): qty 2

## 2021-11-08 MED ORDER — CARBAMAZEPINE 100 MG PO CHEW
100.0000 mg | CHEWABLE_TABLET | Freq: Two times a day (BID) | ORAL | Status: DC
  Administered 2021-11-08 – 2021-11-12 (×8): 100 mg via ORAL
  Filled 2021-11-08 (×14): qty 1

## 2021-11-08 MED ORDER — INFLUENZA VAC SPLIT QUAD 0.5 ML IM SUSY
0.5000 mL | PREFILLED_SYRINGE | INTRAMUSCULAR | Status: AC
  Administered 2021-11-09: 0.5 mL via INTRAMUSCULAR
  Filled 2021-11-08: qty 0.5

## 2021-11-08 MED ORDER — ACETAMINOPHEN 325 MG PO TABS
650.0000 mg | ORAL_TABLET | Freq: Four times a day (QID) | ORAL | Status: DC | PRN
  Administered 2021-11-09 – 2021-11-21 (×11): 650 mg via ORAL
  Filled 2021-11-08 (×11): qty 2

## 2021-11-08 MED ORDER — PROPRANOLOL HCL 10 MG PO TABS
10.0000 mg | ORAL_TABLET | Freq: Three times a day (TID) | ORAL | Status: DC
  Administered 2021-11-08 – 2021-11-25 (×47): 10 mg via ORAL
  Filled 2021-11-08 (×8): qty 1
  Filled 2021-11-08: qty 21
  Filled 2021-11-08 (×13): qty 1
  Filled 2021-11-08: qty 21
  Filled 2021-11-08 (×7): qty 1
  Filled 2021-11-08: qty 21
  Filled 2021-11-08 (×10): qty 1
  Filled 2021-11-08: qty 21
  Filled 2021-11-08 (×8): qty 1
  Filled 2021-11-08 (×2): qty 21
  Filled 2021-11-08 (×6): qty 1

## 2021-11-08 NOTE — Consult Note (Signed)
?  Patient has been  accepted to [facility] for inpatient psychiatric treatment.  ? ?Lindsey Prince [name], NP  ?

## 2021-11-08 NOTE — Progress Notes (Signed)
[facility] LCSW Progress Note ?  ?11/08/2021    10:31 AM ? ?Lindsey Prince  ? ?[id number]  ? ?Type of Contact and Topic:  Psychiatric Bed Placement  ? ?Pt accepted to [facility] [id number]   ? ?Patient meets inpatient criteria per Cecilio Asper, NP ? ?The attending provider will be Bartholomew Crews, MD  ? ?Call report to [phone]   ? ?Hope Brent General, RN @ [facility] ED notified.    ? ?Pt scheduled  to arrive at [facility] TODAY at 1500.  ? ? ?Damita Dunnings, MSW, LCSW-A  ?10:32 AM 11/08/2021   ? ?  ?  ? ? ? ? ?  ?

## 2021-11-08 NOTE — ED Notes (Signed)
Faxed over copy of Voluntary consent to go to [facility] ?

## 2021-11-08 NOTE — ED Notes (Signed)
Called report to [facility] RN.  ?
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
## 2021-11-08 NOTE — Tx Team (Signed)
Initial Treatment Plan ?11/08/2021 ?7:47 PM ?[name] ?[id number] ? ? ? ?PATIENT STRESSORS: ?Financial difficulties   ?Marital or family conflict   ?Medication change or noncompliance   ?Occupational concerns   ?Substance abuse   ? ? ?PATIENT STRENGTHS: ?Capable of independent living  ?Communication skills  ?Physical Health  ? ? ?PATIENT IDENTIFIED PROBLEMS: ?Altered perceptions "I was hearing voices that's why I went to the hospital".  ?  ?Alterations in mood (Anxiety & depression, crying spells) "I was down, I've been through a lot & I don't don't have no one"  ?  ?Medication noncompliance "I have not taken no psych medications for some months now".  ?  ?Substance Abuse (Marijuana)  ?  ?  ?  ? ?DISCHARGE CRITERIA:  ?Improved stabilization in mood, thinking, and/or behavior ?Verbal commitment to aftercare and medication compliance ? ?PRELIMINARY DISCHARGE PLAN: ?Participate in family therapy ?Return to previous living arrangement ? ?PATIENT/FAMILY INVOLVEMENT: ?This treatment plan has been presented to and reviewed with the patient, Lindsey Prince. The patient have been given the opportunity to ask questions and make suggestions. ? ?Sherryl Manges, RN ?11/08/2021, 7:47 PM ?

## 2021-11-08 NOTE — ED Notes (Signed)
Breakfast order placed ?

## 2021-11-08 NOTE — ED Notes (Signed)
Secretary has called safe transport for transportation to [facility] ?

## 2021-11-09 DIAGNOSIS — F319 Bipolar disorder, unspecified: Secondary | ICD-10-CM

## 2021-11-09 DIAGNOSIS — A6000 Herpesviral infection of urogenital system, unspecified: Secondary | ICD-10-CM | POA: Insufficient documentation

## 2021-11-09 DIAGNOSIS — K029 Dental caries, unspecified: Secondary | ICD-10-CM | POA: Insufficient documentation

## 2021-11-09 DIAGNOSIS — F17200 Nicotine dependence, unspecified, uncomplicated: Secondary | ICD-10-CM | POA: Insufficient documentation

## 2021-11-09 DIAGNOSIS — Z6791 Unspecified blood type, Rh negative: Secondary | ICD-10-CM | POA: Insufficient documentation

## 2021-11-09 MED ORDER — TRAZODONE HCL 50 MG PO TABS
50.0000 mg | ORAL_TABLET | Freq: Every evening | ORAL | Status: DC | PRN
  Filled 2021-11-09: qty 1
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
## 2021-11-09 NOTE — Progress Notes (Signed)
Patient refused to take her night time medication (risperdal 2mg , propanolol 10mg , and metronidazole 500mg ). RN attempted to explain trichomonas infection and the importance of sticking to her antibiotic therapy. Margorie John PA informed of patient's refusal to take medications.  ?
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
## 2021-11-09 NOTE — [facility] Group Notes (Signed)
[facility] Group Notes:  (Nursing/MHT/Case Management/Adjunct) ? ?Date:  11/09/2021  ?Time:  8:30 PM ? ?Type of Therapy:  Group Therapy ? ?Participation Level:  Minimal ? ?Participation Quality:  Resistant ? ?Affect:  Blunted and Resistant ? ?Cognitive:  Lacking ? ?Insight:  None ? ?Engagement in Group:  None ? ?Modes of Intervention:  Discussion ? ?Summary of Progress/Problems: stated she had an okay day ? ?Lindsey Prince ?11/09/2021, 8:30 PM ?

## 2021-11-09 NOTE — Progress Notes (Signed)
?   11/08/21 2200  ?Psych Admission Type (Psych Patients Only)  ?Admission Status Voluntary  ?Psychosocial Assessment  ?Patient Complaints Anxiety;Isolation;Depression  ?Eye Contact Brief  ?Facial Expression Sad;Anxious;Worried  ?Affect Appropriate to circumstance  ?Speech Logical/coherent  ?Interaction Assertive;Guarded  ?Motor Activity Slow  ?Appearance/Hygiene Improved  ?Behavior Characteristics Appropriate to situation;Cooperative  ?Mood Depressed;Anxious  ?Thought Process  ?Coherency Concrete thinking  ?Content WDL  ?Delusions None reported or observed  ?Perception Hallucinations  ?Hallucination Auditory  ?Judgment Poor  ?Confusion None  ?Danger to Self  ?Current suicidal ideation? Denies  ?Danger to Others  ?Danger to Others None reported or observed  ? ?Patient presents with anxious affect and depressed mood.  Denies pain, auditory and visual hallucinations. Maintained on routine safety checks.  Medications given as prescribed.  Support and encouragement offered as needed.  Will continue to monitor. ?

## 2021-11-09 NOTE — Progress Notes (Signed)
?   11/09/21 0900  ?Psych Admission Type (Psych Patients Only)  ?Admission Status Voluntary  ?Psychosocial Assessment  ?Patient Complaints Anxiety  ?Eye Contact Brief;Fair  ?Facial Expression Anxious;Sad  ?Affect Labile  ?Speech Logical/coherent  ?Interaction Assertive  ?Motor Activity Fidgety;Restless  ?Appearance/Hygiene Unremarkable  ?Behavior Characteristics Cooperative  ?Mood Labile;Anxious;Preoccupied  ?Thought Process  ?Coherency WDL  ?Content WDL  ?Delusions None reported or observed  ?Perception WDL  ?Hallucination None reported or observed  ?Judgment WDL  ?Confusion None  ?Danger to Self  ?Current suicidal ideation? Denies  ?Danger to Others  ?Danger to Others None reported or observed  ? ?RN reviewed Vergie Living RN student pt assessment/flowsheets and agree with assessment and plan of care. ?

## 2021-11-09 NOTE — Group Note (Signed)
Date:  11/09/2021 ?Time:  11:31 AM ? ?Group Topic/Focus:  ?Goals Group:   The focus of this group is to help patients establish daily goals to achieve during treatment and discuss how the patient can incorporate goal setting into their daily lives to aide in recovery. ?Orientation:   The focus of this group is to educate the patient on the purpose and policies of crisis stabilization and provide a format to answer questions about their admission.  The group details unit policies and expectations of patients while admitted. ? ? ? ?Participation Level:  Did Not Attend ? ?Participation Quality:   ? ?Affect:   ? ?Cognitive:   ? ?Insight:  ? ?Engagement in Group:   ? ?Modes of Intervention:   ? ?Additional Comments:   ? ?Jaquita Rector ?11/09/2021, 11:31 AM ? ?
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
## 2021-11-09 NOTE — [facility] Suicide Risk Assessment (Signed)
[facility] INPATIENT:  Family/Significant Other Suicide Prevention Education ? ?Suicide Prevention Education:  ?Patient Refusal for Family/Significant Other Suicide Prevention Education: The patient Lindsey Prince has refused to provide written consent for family/significant other to be provided Family/Significant Other Suicide Prevention Education during admission and/or prior to discharge.  Physician notified. ? ?Avelardo Reesman A Ishana Blades ?11/09/2021, 10:54 AM ?

## 2021-11-09 NOTE — Progress Notes (Signed)
Patient did not attend wrap up group AA. 

## 2021-11-10 ENCOUNTER — Encounter (HOSPITAL_COMMUNITY): Payer: Self-pay

## 2021-11-10 DIAGNOSIS — F319 Bipolar disorder, unspecified: Secondary | ICD-10-CM | POA: Diagnosis not present

## 2021-11-10 LAB — COMPREHENSIVE METABOLIC PANEL
ALT: 30 U/L (ref 0–44)
AST: 24 U/L (ref 15–41)
Albumin: 4.1 g/dL (ref 3.5–5.0)
Alkaline Phosphatase: 51 U/L (ref 38–126)
Anion gap: 6 (ref 5–15)
BUN: 12 mg/dL (ref 6–20)
CO2: 27 mmol/L (ref 22–32)
Calcium: 9.1 mg/dL (ref 8.9–10.3)
Chloride: 105 mmol/L (ref 98–111)
Creatinine, Ser: 0.81 mg/dL (ref 0.44–1.00)
GFR, Estimated: >60 mL/min (ref >60)
Glucose, Bld: 92 mg/dL (ref 70–99)
Potassium: 3.7 mmol/L (ref 3.5–5.1)
Sodium: 138 mmol/L (ref 135–145)
Total Bilirubin: 0.4 mg/dL (ref 0.3–1.2)
Total Protein: 6.9 g/dL (ref 6.5–8.1)

## 2021-11-10 LAB — CBC WITH DIFFERENTIAL/PLATELET
Abs Immature Granulocytes: 0.05 10*3/uL (ref 0.00–0.07)
Basophils Absolute: 0.1 10*3/uL (ref 0.0–0.1)
Basophils Relative: 1 %
Eosinophils Absolute: 0.2 10*3/uL (ref 0.0–0.5)
Eosinophils Relative: 1 %
HCT: 39.8 % (ref 36.0–46.0)
Hemoglobin: 13.6 g/dL (ref 12.0–15.0)
Immature Granulocytes: 0 %
Lymphocytes Relative: 21 %
Lymphs Abs: 2.5 10*3/uL (ref 0.7–4.0)
MCH: 32.3 pg (ref 26.0–34.0)
MCHC: 34.2 g/dL (ref 30.0–36.0)
MCV: 94.5 fL (ref 80.0–100.0)
Monocytes Absolute: 0.9 10*3/uL (ref 0.1–1.0)
Monocytes Relative: 7 %
Neutro Abs: 8.5 10*3/uL — ABNORMAL HIGH (ref 1.7–7.7)
Neutrophils Relative %: 70 %
Platelets: 416 10*3/uL — ABNORMAL HIGH (ref 150–400)
RBC: 4.21 MIL/uL (ref 3.87–5.11)
RDW: 12.6 % (ref 11.5–15.5)
WBC: 12.2 10*3/uL — ABNORMAL HIGH (ref 4.0–10.5)
nRBC: 0 % (ref 0.0–0.2)

## 2021-11-10 NOTE — Progress Notes (Signed)
Pt was encouraged but didn't attend therapeutic relaxation group. ?

## 2021-11-10 NOTE — Progress Notes (Signed)
?  ?   ?   ?  RN reviewed and is in agreement with Vanessa Fiscus'  ([employer] student) flowsheets charting this shift.  ?  ?  ? ?

## 2021-11-10 NOTE — Progress Notes (Signed)
Pt angry and argumentative this evening ? ? ? 11/10/21 2200  ?Psych Admission Type (Psych Patients Only)  ?Admission Status Voluntary  ?Psychosocial Assessment  ?Patient Complaints None  ?Eye Contact Darting  ?Facial Expression Flat  ?Affect Labile  ?Speech Argumentative;Aggressive  ?Interaction Demanding  ?Motor Activity Fidgety  ?Appearance/Hygiene Unremarkable  ?Behavior Characteristics Agitated  ?Mood Labile;Suspicious;Irritable;Preoccupied  ?Aggressive Behavior  ?Effect No apparent injury  ?Thought Process  ?Coherency WDL  ?Content WDL  ?Delusions WDL  ?Perception WDL  ?Hallucination None reported or observed  ?Judgment WDL  ?Confusion None  ?Danger to Self  ?Current suicidal ideation? Denies  ?Danger to Others  ?Danger to Others None reported or observed  ? ? ?

## 2021-11-10 NOTE — Progress Notes (Signed)
?   11/09/21 2156  ?Psych Admission Type (Psych Patients Only)  ?Admission Status Voluntary  ?Psychosocial Assessment  ?Patient Complaints Anxiety;Irritability  ?Eye Contact Glaring;Intense  ?Facial Expression Angry  ?Affect Labile;Irritable  ?Speech Logical/coherent;Argumentative  ?Interaction Assertive;Defensive  ?Motor Activity Fidgety  ?Appearance/Hygiene Unremarkable  ?Behavior Characteristics Cooperative;Appropriate to situation  ?Mood Anxious;Preoccupied;Irritable  ?Thought Process  ?Coherency WDL  ?Content WDL  ?Delusions None reported or observed  ?Perception WDL  ?Hallucination None reported or observed  ?Judgment Impaired  ?Confusion None  ?Danger to Self  ?Current suicidal ideation? Denies  ?Danger to Others  ?Danger to Others None reported or observed  ? ? ?

## 2021-11-10 NOTE — Progress Notes (Signed)
The focus of this group is to help patients review their daily goal of treatment and discuss progress on daily workbooks. Pt did not attend the evening group. 

## 2021-11-10 NOTE — Progress Notes (Signed)
[facility] MD Progress Note  11/10/2021 4:32 PM Lindsey Prince  MRN:  [id number] Subjective:  Patient is a 36 year old female with past psychiatric history of bipolar 1 disorder, PTSD and history of psychosis who initially presented unaccompanied to [facility], ED with complaints of anxiety, auditory hallucinations, paranoia and mood lability.   Chart review from last 24 hours-The patient's chart was reviewed and nursing notes were reviewed. Patient discussed in progression rounds with treatment team. MAR was reviewed and Pt was not complaint  with scheduled medications and refused Flagyl, propanolol and risperidone and required PRN medications Tylenol x1 yesterday and x2 today. Homeless resources were given by CSW.  Patient did not attend morning orientation group and recreational therapy group this morning. charting shows patient slept for 1.25 hours.  Pt is seen today. Pt states her mood is better. She feels less depressed and anxious today.  She states she feels safe here.  She states she got her periods and has menstrual cramps.  Pt slept well last night.  Although, charting shows patient slept for 1.25 hours.When asked why did she refuse medication yesterday, she states she did not trust the nurse who was giving her medication.  She states she asked her what the medication was for but she did not answer.   Pt states her appetite is good . She did not talk to anybody on phone.  She states CSW gave her resources for shelters but it did not work in the past.  She states shelters are mostly for people who have other support systems but she does not have any support at all.  She states shelters do not offer enough help for her.  Currently, Pt denies any suicidal ideation, homicidal ideation and, visual and auditory hallucination. She denies paranoia.  She denies any thought insertion/withdrawal, ideas of reference. Pt denies any physical complaints other than menstrual cramps. Pt denies any medication side effects  and has been tolerating it well. Pt denies any concerns.     Principal Problem: Severe bipolar I disorder, current or most recent episode depressed (HCC) Diagnosis: Principal Problem:   Severe bipolar I disorder, current or most recent episode depressed (HCC) Active Problems:   PTSD (post-traumatic stress disorder)  Total Time spent with patient: 30 minutes I personally spent 30 minutes on the unit in direct patient care. The direct patient care time included face-to-face time with the patient, reviewing the patient's chart, communicating with other professionals, and coordinating care. Greater than 50% of this time was spent in counseling or coordinating care with the patient regarding goals of hospitalization, psycho-education, and discharge planning needs.  Past Psychiatric History: see H&P  Past Medical History:  Past Medical History:  Diagnosis Date   Anemia    Herpes     Past Surgical History:  Procedure Laterality Date   NO PAST SURGERIES     Family History:  Family History  Problem Relation Age of Onset   Healthy Neg Hx    Family Psychiatric  History: see H&P Social History:  Social History   Substance and Sexual Activity  Alcohol Use No     Social History   Substance and Sexual Activity  Drug Use Yes   Types: Marijuana    Social History   Socioeconomic History   Marital status: Single    Spouse name: Not on file   Number of children: Not on file   Years of education: Not on file   Highest education level: Not on file  Occupational History  Not on file  Tobacco Use   Smoking status: Every Day    Packs/day: 1.50    Types: Cigarettes   Smokeless tobacco: Never  Substance and Sexual Activity   Alcohol use: No   Drug use: Yes    Types: Marijuana   Sexual activity: Not Currently  Other Topics Concern   Not on file  Social History Narrative   Not on file   Social Determinants of Health   Financial Resource Strain: Not on file  Food Insecurity:  Not on file  Transportation Needs: Not on file  Physical Activity: Not on file  Stress: Not on file  Social Connections: Not on file   Additional Social History:                         Sleep: Poor 1.25 hrs (good per patient)  Appetite:  Good  Current Medications: Current Facility-Administered Medications  Medication Dose Route Frequency Provider Last Rate Last Admin   acetaminophen (TYLENOL) tablet 650 mg  650 mg Oral Q6H PRN Rankin, [name], NP   650 mg at 11/10/21 1000   alum & mag hydroxide-simeth (MAALOX/MYLANTA) 200-200-20 MG/5ML suspension 30 mL  30 mL Oral Q4H PRN Rankin, [name], NP       benztropine (COGENTIN) tablet 0.5 mg  0.5 mg Oral BID PRN Rankin, [name], NP       carbamazepine (TEGRETOL) chewable tablet 100 mg  100 mg Oral BID Rankin, [name], NP   100 mg at 11/10/21 0752   magnesium hydroxide (MILK OF MAGNESIA) suspension 30 mL  30 mL Oral Daily PRN Rankin, [name], NP       metroNIDAZOLE (FLAGYL) tablet 500 mg  500 mg Oral Q12H Rankin, [name], NP   500 mg at 11/10/21 0752   propranolol (INDERAL) tablet 10 mg  10 mg Oral TID Rankin, [name], NP   10 mg at 11/10/21 1300   risperiDONE (RISPERDAL M-TABS) disintegrating tablet 2 mg  2 mg Oral QHS Rankin, [name], NP   2 mg at 11/08/21 2043   traZODone (DESYREL) tablet 50 mg  50 mg Oral QHS PRN Karsten Ro, MD        Lab Results:  Results for orders placed or performed during the hospital encounter of 11/08/21 (from the past 48 hour(s))  CBC with Differential/Platelet     Status: Abnormal   Collection Time: 11/10/21  6:10 AM  Result Value Ref Range   WBC 12.2 (H) 4.0 - 10.5 K/uL   RBC 4.21 3.87 - 5.11 MIL/uL   Hemoglobin 13.6 12.0 - 15.0 g/dL   HCT 31.5 40.0 - 86.7 %   MCV 94.5 80.0 - 100.0 fL   MCH 32.3 26.0 - 34.0 pg   MCHC 34.2 30.0 - 36.0 g/dL   RDW 61.9 50.9 - 32.6 %   Platelets 416 (H) 150 - 400 K/uL   nRBC 0.0 0.0 - 0.2 %   Neutrophils Relative % 70 %   Neutro Abs 8.5 (H) 1.7 - 7.7  K/uL   Lymphocytes Relative 21 %   Lymphs Abs 2.5 0.7 - 4.0 K/uL   Monocytes Relative 7 %   Monocytes Absolute 0.9 0.1 - 1.0 K/uL   Eosinophils Relative 1 %   Eosinophils Absolute 0.2 0.0 - 0.5 K/uL   Basophils Relative 1 %   Basophils Absolute 0.1 0.0 - 0.1 K/uL   Immature Granulocytes 0 %   Abs Immature Granulocytes 0.05 0.00 -  0.07 K/uL    Comment: Performed at [facility], [address] [place], [place] [address]  Comprehensive metabolic panel     Status: None   Collection Time: 11/10/21  6:10 AM  Result Value Ref Range   Sodium 138 135 - 145 mmol/L   Potassium 3.7 3.5 - 5.1 mmol/L   Chloride 105 98 - 111 mmol/L   CO2 27 22 - 32 mmol/L   Glucose, Bld 92 70 - 99 mg/dL    Comment: Glucose reference range applies only to samples taken after fasting for at least 8 hours.   BUN 12 6 - 20 mg/dL   Creatinine, Ser 6.040.81 0.44 - 1.00 mg/dL   Calcium 9.1 8.9 - 54.010.3 mg/dL   Total Protein 6.9 6.5 - 8.1 g/dL   Albumin 4.1 3.5 - 5.0 g/dL   AST 24 15 - 41 U/L   ALT 30 0 - 44 U/L   Alkaline Phosphatase 51 38 - 126 U/L   Total Bilirubin 0.4 0.3 - 1.2 mg/dL   GFR, Estimated >98>60 >11>60 mL/min    Comment: (NOTE) Calculated using the CKD-EPI Creatinine Equation (2021)    Anion gap 6 5 - 15    Comment: Performed at [facility], [address] [place], [place] [address]    Blood Alcohol level:  Lab Results  Component Value Date   [facility] 11/06/2021   ETH <10 01/24/2021    Metabolic Disorder Labs: Lab Results  Component Value Date   HGBA1C 5.4 01/24/2021   MPG 108.28 01/24/2021   No results found for: PROLACTIN Lab Results  Component Value Date   CHOL 145 01/24/2021   TRIG 125 01/24/2021   HDL 73 01/24/2021   CHOLHDL 2.0 01/24/2021   VLDL 25 01/24/2021   LDLCALC 47 01/24/2021    Physical Findings: AIMS: Facial and Oral Movements Muscles of Facial Expression: None, normal Lips and Perioral Area: None, normal Jaw: None, normal Tongue:  None, normal,Extremity Movements Upper (arms, wrists, hands, fingers): None, normal Lower (legs, knees, ankles, toes): None, normal, Trunk Movements Neck, shoulders, hips: None, normal, Overall Severity Severity of abnormal movements (highest score from questions above): None, normal Incapacitation due to abnormal movements: None, normal Patient's awareness of abnormal movements (rate only patient's report): No Awareness, Dental Status Current problems with teeth and/or dentures?: No Does patient usually wear dentures?: No  CIWA:    COWS:     Musculoskeletal: Strength & Muscle Tone: within normal limits Gait & Station: normal Patient leans: N/A  Psychiatric Specialty Exam:  Presentation  General Appearance: Appropriate for Environment; Casual  Eye Contact:Fair  Speech:Clear and Coherent; Normal Rate  Speech Volume:Normal  Handedness:Right   Mood and Affect  Mood:Anxious; Hopeless  Affect:Restricted   Thought Process  Thought Processes:Coherent  Descriptions of Associations:Intact  Orientation:Full (Time, Place and Person)  Thought Content:WDL  History of Schizophrenia/Schizoaffective disorder:No  Duration of Psychotic Symptoms:Greater than six months  Hallucinations:Hallucinations: None  Ideas of Reference:None  Suicidal Thoughts:Suicidal Thoughts: No  Homicidal Thoughts:Homicidal Thoughts: No   Sensorium  Memory:Immediate Good; Recent Fair; Remote Fair  Judgment:Poor  Insight:Shallow   Executive Functions  Concentration:Fair  Attention Span:Fair  Recall:Fair  Fund of Knowledge:Good  Language:Good   Psychomotor Activity  Psychomotor Activity:Psychomotor Activity: Normal   Assets  Assets:Resilience; Physical Health; Desire for Improvement   Sleep  Sleep:Sleep: Good Number of Hours of Sleep: 1.25    Physical Exam: Physical Exam Vitals and nursing note reviewed.  Constitutional:  General: She is not in acute distress.     Appearance: Normal appearance. She is not ill-appearing, toxic-appearing or diaphoretic.  HENT:     Head: Normocephalic.  Pulmonary:     Effort: Pulmonary effort is normal.  Neurological:     Mental Status: She is alert and oriented to person, place, and time.   Review of Systems  Constitutional:  Negative for fever.  Respiratory:  Negative for cough and shortness of breath.   Cardiovascular:  Negative for chest pain.  Neurological:  Negative for dizziness and headaches.  Psychiatric/Behavioral:  Positive for substance abuse. Negative for hallucinations and suicidal ideas. The patient is nervous/anxious. The patient does not have insomnia.   Blood pressure 109/77, pulse 64, temperature (!) 97.3 F (36.3 C), temperature source Oral, resp. rate 18, height 5\' 2"  (1.575 m), weight 57.6 kg, SpO2 100 %. Body mass index is 23.23 kg/m.   Treatment Plan Summary:Patient is a 36 year old female with past psychiatric history of bipolar 1 disorder, PTSD and history of psychosis who initially presented unaccompanied to [facility], ED with complaints of anxiety, auditory hallucinations, paranoia and mood lability. She refused medication last night due to paranoia. Patient had to move to 500 unit in case second opinion for forced medication is required.  Daily contact with patient to assess and evaluate symptoms and progress in treatment   Labs reviewed  New Labs: CMP wnl, CBC shows wbc 12.2, platelets 416 Safety and Monitoring -- Involuntary admission to inpatient psychiatric unit for safety, stabilization and treatment -- Daily contact with patient to assess and evaluate symptoms and progress in treatment -- Patient's case to be discussed in multi-disciplinary team meeting. -- Patient will be encouraged to participate in the therapeutic group milieu. -- Observation Level : q15 minute checks -- Vital signs:  q12 hours -- Precautions: suicide, elopement, assault  Plan  -Monitor Vitals. -Monitor  for Suicidal Ideation. -Monitor for withdrawal symptoms. -Monitor for medication side effects.   Bipolar 1, current episode depressed, severe with psychotic features PTSD -Continue benztropine 0.5 mg twice daily -Continue Tegretol 100 mg twice daily -Continue  propanolol 10 mg 3 times daily -Continue  risperidone M tabs 2 mg nightly  -Encouraged compliance with medications.  Trichomonal infection Continue Flagyl for 500 mg twice daily for 7 days. Continue to monitor symptoms.   Marijuana abuse -Encouraged cessation.   PRN's  -Continue Tylenol 650 mg every 6 hours as needed for pain or fever -Continue Milk of Magnesia 30 ml PRN Daily for Constipation. -Continue Maalox/Mylanta 30 ml Q4H PRN for Indigestion. -Continue Hydroxyzine 25 mg TID PRN for Anxiety. -Continue Trazodone 50 mg QHS PRN for sleep.    Discharge Planning: Social work and case management to assist with discharge planning and identification of hospital follow-up needs prior to discharge Estimated LOS: 5-7 days Discharge Concerns: Need to establish a safety plan; Medication compliance and effectiveness Discharge Goals: Return home with outpatient referrals for mental health follow-up including medication management/psychotherapy.  [facility], MD 11/10/2021, 4:32 PM

## 2021-11-10 NOTE — Group Note (Signed)
Recreation Therapy Group Note ? ? ?Group Topic:Stress Management  ?Group Date: 11/10/2021 ?Start Time: [phone] ?End Time: 9450 ?Facilitators: Caroll Rancher, LRT,CTRS ?Location: 500 Hall Dayroom ? ? ?Goal Area(s) Addresses:  ?Patient will identify positive stress management techniques. ?Patient will identify benefits of using stress management post d/c. ? ?Group Description:  Meditation.  LRT played a meditation that focused on letting go of the past and not letting it inhibit progress moving forward.  It also focused on not dwelling on things that you can't change because they have already happened.  Patients were to listen to the meditation and follow along as it played to fully engage.  ? ? ?Affect/Mood: N/A ?  ?Participation Level: Did not attend ?  ? ?Clinical Observations/Individualized Feedback:   ? ? ?Plan: Continue to engage patient in RT group sessions 2-3x/week. ? ? ?Caroll Rancher, LRT,CTRS ?11/10/2021 1:28 PM ?

## 2021-11-10 NOTE — Group Note (Signed)
LCSW Group Therapy Note ? ? ?Group Date: 11/10/2021 ?Start Time: 1300 ?End Time: 1400 ? ?Type of Group and Topic: Psychoeducational Group: Discharge Planning ? ?Participation Level: Did Not Attend ? ?Description of Group: ?Discharge planning group reviews patient's anticipated discharge plans and assists patients to anticipate and address any barriers to wellness/recovery in the community. Suicide prevention education is reviewed with patients in group. ? ?Therapeutic Goals ?1. Patients will state their anticipated discharge plan and mental health aftercare ?2. Patients will identify potential barriers to wellness in the community setting ?3. Patients will engage in problem solving, solution focused discussion of ways to anticipate and address barriers to wellness/recovery ? ?Summary of Patient Progress: Did not attend  ? ?Aram Beecham, LCSWA ?11/10/2021  2:00 PM   ? ?

## 2021-11-10 NOTE — Progress Notes (Addendum)
1900 Pt left dayroom crying. RN went to check on pt and pt was stating that she was trying to deal with all of her trauma. Pt became agitated and started yelling. Pt said that she trusts no one and that she goes through the same thing every time she comes here. Pt kept stating that she had no one and has never had no one. That the resources that she has gotten do not help and that no one will do anything. Pt stated that she has had nothing for 36 years years and no else understands.  Pt is very paranoid and started yelling. Pt is not redirectable. Pt was able to calm somewhat down with verbal de-escalation.  ?

## 2021-11-10 NOTE — BH IP Treatment Plan (Signed)
Interdisciplinary Treatment and Diagnostic Plan Update  11/10/2021 Time of Session: 9:30am  AMMI HUTT MRN: [id number]  Principal Diagnosis: Severe bipolar I disorder, current or most recent episode depressed ([place])  Secondary Diagnoses: Principal Problem:   Severe bipolar I disorder, current or most recent episode depressed ([place]) Active Problems:   PTSD (post-traumatic stress disorder)   Current Medications:  Current Facility-Administered Medications  Medication Dose Route Frequency Provider Last Rate Last Admin   acetaminophen (TYLENOL) tablet 650 mg  650 mg Oral Q6H PRN Rankin, [name], NP   650 mg at 11/10/21 1000   alum & mag hydroxide-simeth (MAALOX/MYLANTA) 200-200-20 MG/5ML suspension 30 mL  30 mL Oral Q4H PRN Rankin, [name], NP       benztropine (COGENTIN) tablet 0.5 mg  0.5 mg Oral BID PRN Rankin, [name], NP       carbamazepine (TEGRETOL) chewable tablet 100 mg  100 mg Oral BID Rankin, [name], NP   100 mg at 11/10/21 1245   magnesium hydroxide (MILK OF MAGNESIA) suspension 30 mL  30 mL Oral Daily PRN Rankin, [name], NP       metroNIDAZOLE (FLAGYL) tablet 500 mg  500 mg Oral Q12H Rankin, [name], NP   500 mg at 11/10/21 0752   propranolol (INDERAL) tablet 10 mg  10 mg Oral TID Rankin, [name], NP   10 mg at 11/10/21 1300   risperiDONE (RISPERDAL M-TABS) disintegrating tablet 2 mg  2 mg Oral QHS Rankin, [name], NP   2 mg at 11/08/21 2043   traZODone (DESYREL) tablet 50 mg  50 mg Oral QHS PRN Armando Reichert, MD       PTA Medications: Medications Prior to Admission  Medication Sig Dispense Refill Last Dose   benztropine (COGENTIN) 0.5 MG tablet Take 1 tablet (0.5 mg total) by mouth 2 (two) times daily as needed for tremors. (Patient not taking: Reported on 11/06/2021) 45 tablet 0    carbamazepine (TEGRETOL) 100 MG chewable tablet Chew 1 tablet (100 mg total) by mouth 2 (two) times daily. (Patient not taking: Reported on 11/06/2021) 60 tablet 0    ibuprofen (ADVIL) 200  MG tablet Take 400-800 mg by mouth every 6 (six) hours as needed for moderate pain or headache.      loratadine (CLARITIN) 10 MG tablet Take 1 tablet (10 mg total) by mouth daily. 30 tablet 0    propranolol (INDERAL) 10 MG tablet Take 1 tablet (10 mg total) by mouth 3 (three) times daily. (Patient not taking: Reported on 11/06/2021) 90 tablet 0    risperiDONE (RISPERDAL M-TABS) 2 MG disintegrating tablet Take 1 tablet (2 mg total) by mouth at bedtime. (Patient not taking: Reported on 11/06/2021) 30 tablet 0    traZODone (DESYREL) 100 MG tablet Take 1 tablet (100 mg total) by mouth at bedtime as needed for sleep. (Patient not taking: Reported on 11/06/2021) 25 tablet 0     Patient Stressors: Financial difficulties   Marital or family conflict   Medication change or noncompliance   Occupational concerns   Substance abuse    Patient Strengths: Capable of independent living  Armed forces logistics/support/administrative officer  Physical Health   Treatment Modalities: Medication Management, Group therapy, Case management,  1 to 1 session with clinician, Psychoeducation, Recreational therapy.   Physician Treatment Plan for Primary Diagnosis: Severe bipolar I disorder, current or most recent episode depressed ([place]) Long Term Goal(s): Improvement in symptoms so as ready for discharge   Short Term Goals: Ability to identify  changes in lifestyle to reduce recurrence of condition will improve Ability to verbalize feelings will improve Ability to disclose and discuss suicidal ideas Ability to demonstrate self-control will improve Ability to identify and develop effective coping behaviors will improve Ability to maintain clinical measurements within normal limits will improve Compliance with prescribed medications will improve Ability to identify triggers associated with substance abuse/mental health issues will improve  Medication Management: Evaluate patient's response, side effects, and tolerance of medication  regimen.  Therapeutic Interventions: 1 to 1 sessions, Unit Group sessions and Medication administration.  Evaluation of Outcomes: Not Met  Physician Treatment Plan for Secondary Diagnosis: Principal Problem:   Severe bipolar I disorder, current or most recent episode depressed ([place]) Active Problems:   PTSD (post-traumatic stress disorder)  Long Term Goal(s): Improvement in symptoms so as ready for discharge   Short Term Goals: Ability to identify changes in lifestyle to reduce recurrence of condition will improve Ability to verbalize feelings will improve Ability to disclose and discuss suicidal ideas Ability to demonstrate self-control will improve Ability to identify and develop effective coping behaviors will improve Ability to maintain clinical measurements within normal limits will improve Compliance with prescribed medications will improve Ability to identify triggers associated with substance abuse/mental health issues will improve     Medication Management: Evaluate patient's response, side effects, and tolerance of medication regimen.  Therapeutic Interventions: 1 to 1 sessions, Unit Group sessions and Medication administration.  Evaluation of Outcomes: Not Met   RN Treatment Plan for Primary Diagnosis: Severe bipolar I disorder, current or most recent episode depressed ([place]) Long Term Goal(s): Knowledge of disease and therapeutic regimen to maintain health will improve  Short Term Goals: Ability to remain free from injury will improve, Ability to participate in decision making will improve, Ability to verbalize feelings will improve, Ability to disclose and discuss suicidal ideas, and Ability to identify and develop effective coping behaviors will improve  Medication Management: RN will administer medications as ordered by provider, will assess and evaluate patient's response and provide education to patient for prescribed medication. RN will report any adverse and/or side  effects to prescribing provider.  Therapeutic Interventions: 1 on 1 counseling sessions, Psychoeducation, Medication administration, Evaluate responses to treatment, Monitor vital signs and CBGs as ordered, Perform/monitor CIWA, COWS, AIMS and Fall Risk screenings as ordered, Perform wound care treatments as ordered.  Evaluation of Outcomes: Not Met   LCSW Treatment Plan for Primary Diagnosis: Severe bipolar I disorder, current or most recent episode depressed ([place]) Long Term Goal(s): Safe transition to appropriate next level of care at discharge, Engage patient in therapeutic group addressing interpersonal concerns.  Short Term Goals: Engage patient in aftercare planning with referrals and resources, Increase social support, Increase emotional regulation, Facilitate acceptance of mental health diagnosis and concerns, Identify triggers associated with mental health/substance abuse issues, and Increase skills for wellness and recovery  Therapeutic Interventions: Assess for all discharge needs, 1 to 1 time with Social worker, Explore available resources and support systems, Assess for adequacy in community support network, Educate family and significant other(s) on suicide prevention, Complete Psychosocial Assessment, Interpersonal group therapy.  Evaluation of Outcomes: Not Met   Progress in Treatment: Attending groups: Yes. Participating in groups: Yes. Taking medication as prescribed: Yes. Toleration medication: Yes. Family/Significant other contact made: No, will contact:  [place]  Patient understands diagnosis: No. Discussing patient identified problems/goals with staff: Yes. Medical problems stabilized or resolved: Yes. Denies suicidal/homicidal ideation: Yes. Issues/concerns per patient self-inventory: No.   New  problem(s) identified: No, Describe:  None   New Short Term/Long Term Goal(s): medication stabilization, elimination of SI thoughts, development of  comprehensive mental wellness plan.   Patient Goals: "To stay safe"  Discharge Plan or Barriers: Patient recently admitted. CSW will continue to follow and assess for appropriate referrals and possible discharge planning.   Reason for Continuation of Hospitalization: Anxiety Depression Hallucinations Medication stabilization Suicidal ideation  Estimated Length of Stay: 3 to 5 days    Scribe for Treatment Team: Carney Harder 11/10/2021 2:49 PM

## 2021-11-10 NOTE — Progress Notes (Signed)
Pt moved to 500 hall because of increased agitation and pacing in hallway.  Pt is irritable at times during conversation with RN and is also observed laughing at times inappropriately.  Pt took medications without incident.  HR was low at 1700 - it was 54,bpm  so propranolol was held. ? 11/10/21 0800  ?Psych Admission Type (Psych Patients Only)  ?Admission Status Voluntary  ?Psychosocial Assessment  ?Patient Complaints None  ?Eye Contact Brief  ?Facial Expression Flat  ?Affect Labile;Flat  ?Speech Logical/coherent  ?Interaction Assertive  ?Motor Activity Fidgety  ?Appearance/Hygiene Unremarkable  ?Behavior Characteristics Cooperative;Appropriate to situation  ?Mood Labile;Preoccupied  ?Thought Process  ?Coherency WDL  ?Content WDL  ?Delusions None reported or observed  ?Perception WDL  ?Hallucination None reported or observed  ?Judgment WDL  ?Confusion None  ?Danger to Self  ?Current suicidal ideation? Denies  ?Danger to Others  ?Danger to Others None reported or observed  ? ? ?

## 2021-11-10 NOTE — Progress Notes (Signed)
Patient did not attend morning orientation group.  

## 2021-11-10 NOTE — BHH Counselor (Signed)
CSW provided this patient with homeless resources. ? ? ? ? ?Timi Reeser MSW, LCSW ?Clincal Social Worker  ?[facility]  ?

## 2021-11-11 ENCOUNTER — Encounter (HOSPITAL_COMMUNITY): Payer: Self-pay | Admitting: Registered Nurse

## 2021-11-11 DIAGNOSIS — F319 Bipolar disorder, unspecified: Secondary | ICD-10-CM | POA: Diagnosis not present

## 2021-11-11 NOTE — Progress Notes (Signed)
Pt was encouraged but didn't attend orientation/goals group. ?
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
## 2021-11-11 NOTE — Progress Notes (Signed)
?   11/11/21 0602  ?Sleep  ?Number of Hours 7.5  ? ? ?

## 2021-11-11 NOTE — Progress Notes (Signed)
Pt's mood remains labile with periods of irritability and is argumentative on interactions mid day and ends of shift. Denies SI, HI, AVH and pain. Presents with blunted affect, intense stares and is ambulatory in milieu with steady gait. Refused to attend scheduled groups despite multiple prompts. Receives PRN Tylenol twice this shift with heat packs for complain of abdominal pain related to menstrual cramps. Pt remains medications complaint, denies adverse drug reactions. Safety checks maintained at Q 15 minutes intervals without incident. Verbal education provided on current medication regimen and effects monitored. Support and encouragement offered. Pt tolerates all PO intake well. Off unit with peers for meals, returned without issues. Denies concerns at this time.  ? ?

## 2021-11-11 NOTE — Group Note (Signed)
Recreation Therapy Group Note ? ? ?Group Topic: Communication ?Group Date: 11/11/2021 ?Start Time: 0951 ?End Time: 1030 ?Facilitators: Caroll Rancher, LRT,CTRS ?Location: [address] Dayroom ? ? ?Goal Area(s) Addresses:  ?Patient will effectively listen to complete activity.  ?Patient will identify communication skills used to make activity successful.  ?Patient will identify how skills used during activity can be used to reach post d/c goals.  ?  ?Group Description: Geometric Drawings.  Three volunteers from the peer group will be shown an abstract picture with a particular arrangement of geometrical shapes.  Each round, one 'speaker' will describe the pattern, as accurately as possible without revealing the image to the group.  The remaining group members will listen and draw the picture to reflect how it is described to them. Patients with the role of 'listener' cannot ask clarifying questions but, may request that the speaker repeat a direction. Once the drawings are complete, the presenter will show the rest of the group the picture and compare how close each person came to drawing the picture. LRT will facilitate a post-activity discussion regarding effective communication and the importance of planning, listening, and asking for clarification in daily interactions with others. ? ? ?Affect/Mood: N/A ?  ?Participation Level: Did not attend ?  ? ?Clinical Observations/Individualized Feedback:   ? ?  ? ?Plan: Continue to engage patient in RT group sessions 2-3x/week. ? ? ?Caroll Rancher, LRT,CTRS ?11/11/2021 10:56 AM ?

## 2021-11-12 MED ORDER — OLANZAPINE 10 MG PO TABS
10.0000 mg | ORAL_TABLET | Freq: Every day | ORAL | Status: DC
  Administered 2021-11-12 – 2021-11-13 (×2): 10 mg via ORAL
  Filled 2021-11-12 (×5): qty 1

## 2021-11-12 MED ORDER — CARBAMAZEPINE 100 MG PO CHEW: 200.0000 mg | CHEWABLE_TABLET | Freq: Two times a day (BID) | ORAL | Status: DC

## 2021-11-12 MED ORDER — CARBAMAZEPINE 100 MG PO CHEW
100.0000 mg | CHEWABLE_TABLET | Freq: Two times a day (BID) | ORAL | Status: DC
  Administered 2021-11-12 – 2021-11-13 (×2): 100 mg via ORAL
  Filled 2021-11-12 (×6): qty 1

## 2021-11-12 NOTE — BHH Group Notes (Addendum)
Spirituality group facilitated by Kathleen Argue, BCC.  ? ?Group Description: Group focused on topic of hope. Patients participated in facilitated discussion around topic, connecting with one another around experiences and definitions for hope. Group members engaged with visual explorer photos, reflecting on what hope looks like for them today. Group engaged in discussion around how their definitions of hope are present today in hospital.  ? ?Modalities: Psycho-social ed, Adlerian, Narrative, MI  ? ?Patient Progress: Patient attended group and engaged in conversation.  Her daughter brings her hope and she is trying to do her best for the sake of her daughter. ? ?[employer], Bcc ?Pager, [phone] ? ?

## 2021-11-12 NOTE — Progress Notes (Signed)
?   11/12/21 0500  ?Sleep  ?Number of Hours 8  ? ? ?
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
## 2021-11-12 NOTE — Progress Notes (Signed)
Patient did not attend morning orientation/goal's group. 

## 2021-11-12 NOTE — Group Note (Signed)
Recreation Therapy Group Note ? ? ?Group Topic:Team Building  ?Group Date: 11/12/2021 ?Start Time: 1000 ?End Time: [id number] ?Facilitators: Victorino Sparrow, LRT,CTRS ?Location: [place] ? ? ?Goal Area(s) Addresses:  ?Patient will effectively work with peer towards shared goal.  ?Patient will identify skills used to make activity successful.  ?Patient will identify how skills used during activity can be used to reach post d/c goals.  ?  ?Group Description:  Straw Bridge. In teams of 3-5, patients were given 15 plastic drinking straws and an equal length of masking tape. Using the materials provided, patients were instructed to build a free standing bridge-like structure to suspend an everyday item (ex: puzzle box) off of the floor or table surface. All materials were required to be used by the team in their design. LRT facilitated post-activity discussion reviewing team process. Patients were encouraged to reflect how the skills used in this activity can be generalized to daily life post discharge.  ? ? ?Affect/Mood: Appropriate ?  ?Participation Level: Observant ?  ?Participation Quality: Independent ?  ?Behavior: Appropriate ?  ?Speech/Thought Process: Focused ?  ?Insight: Good ?  ?Judgement: Good ?  ?Modes of Intervention: Team-building ?  ?Patient Response to Interventions:  Attentive ?  ?Education Outcome: ? Acknowledges education and In group clarification offered   ? ?Clinical Observations/Individualized Feedback: Pt sat and observed.  Pt did make suggestions to peers who were participating in activity.  Pt was appropriate during group session.  ? ? ?Plan: Continue to engage patient in RT group sessions 2-3x/week. ? ? ?Victorino Sparrow, LRT,CTRS ?11/12/2021 10:59 AM ?

## 2021-11-12 NOTE — Progress Notes (Signed)
?   11/12/21 0000  ?Psych Admission Type (Psych Patients Only)  ?Admission Status Voluntary  ?Psychosocial Assessment  ?Patient Complaints None  ?Eye Contact Fair  ?Facial Expression Flat  ?Affect Appropriate to circumstance  ?Speech Logical/coherent  ?Interaction Attention-seeking  ?Motor Activity Fidgety  ?Appearance/Hygiene Unremarkable  ?Behavior Characteristics Anxious  ?Mood Preoccupied;Anxious  ?Aggressive Behavior  ?Effect No apparent injury  ?Thought Process  ?Coherency WDL  ?Content Blaming others  ?Delusions WDL  ?Perception WDL  ?Hallucination None reported or observed  ?Judgment WDL  ?Confusion None  ?Danger to Self  ?Current suicidal ideation? Denies  ?Danger to Others  ?Danger to Others None reported or observed  ? ? ?
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
## 2021-11-12 NOTE — Progress Notes (Signed)
Pt presents with flat affect, depressed mood, fair eye contact, logical speech and is cooperative with care this shift. Denies SI, HI, AVH and pain at this time. Receives PRN Tylenol 650 mg PO for menstrual cramps 8/10 and pt reported relief when reassessed at 0950. Observed in scheduled groups, engaged in activities and was pleasant. Expressed her desire to stay on the unit for meals "I just want to be away from them on the other side because people were talking about dying, killing yourself. That's too much for me. I rather eat here". Verbal educations provided on scheduled medications and effects monitored. Safety checks maintained at Q 15 minutes intervals without incidents. Support and reassurance provided to pt. Encouraged pt to voice concerns. Pt tolerates all medications and  all meals well. Cooperative with all unit routines, getting needs met safely.  ?

## 2021-11-12 NOTE — Group Note (Signed)
LCSW Group Therapy Note ? ? ?Group Date: 11/12/2021 ?Start Time: 1100 ?End Time: 1200 ? ?Topic: Emotions ? ? ?Due to limited staffing and high volume of discharges and new admissions group was not held. Patient was provided therapeutic worksheets and asked to meet with CSW as needed. ? ? ? ?Drake Wuertz A Lamark Schue, LCSW ?11/12/2021  11:36 AM   ?

## 2021-11-13 DIAGNOSIS — F319 Bipolar disorder, unspecified: Secondary | ICD-10-CM | POA: Diagnosis not present

## 2021-11-13 LAB — BASIC METABOLIC PANEL
Anion gap: 10 (ref 5–15)
BUN: 11 mg/dL (ref 6–20)
CO2: 26 mmol/L (ref 22–32)
Calcium: 8.9 mg/dL (ref 8.9–10.3)
Chloride: 102 mmol/L (ref 98–111)
Creatinine, Ser: 0.79 mg/dL (ref 0.44–1.00)
GFR, Estimated: >60 mL/min (ref >60)
Glucose, Bld: 82 mg/dL (ref 70–99)
Potassium: 4.2 mmol/L (ref 3.5–5.1)
Sodium: 138 mmol/L (ref 135–145)

## 2021-11-13 LAB — CBC WITH DIFFERENTIAL/PLATELET
Abs Immature Granulocytes: 0.03 10*3/uL (ref 0.00–0.07)
Basophils Absolute: 0.1 10*3/uL (ref 0.0–0.1)
Basophils Relative: 1 %
Eosinophils Absolute: 0.3 10*3/uL (ref 0.0–0.5)
Eosinophils Relative: 4 %
HCT: 42.8 % (ref 36.0–46.0)
Hemoglobin: 14.3 g/dL (ref 12.0–15.0)
Immature Granulocytes: 0 %
Lymphocytes Relative: 41 %
Lymphs Abs: 3.3 10*3/uL (ref 0.7–4.0)
MCH: 32.4 pg (ref 26.0–34.0)
MCHC: 33.4 g/dL (ref 30.0–36.0)
MCV: 97.1 fL (ref 80.0–100.0)
Monocytes Absolute: 0.8 10*3/uL (ref 0.1–1.0)
Monocytes Relative: 10 %
Neutro Abs: 3.6 10*3/uL (ref 1.7–7.7)
Neutrophils Relative %: 44 %
Platelets: 432 10*3/uL — ABNORMAL HIGH (ref 150–400)
RBC: 4.41 MIL/uL (ref 3.87–5.11)
RDW: 12.8 % (ref 11.5–15.5)
WBC: 8.1 10*3/uL (ref 4.0–10.5)
nRBC: 0 % (ref 0.0–0.2)

## 2021-11-13 LAB — CARBAMAZEPINE LEVEL, TOTAL: Carbamazepine Lvl: 3.7 ug/mL — ABNORMAL LOW (ref 4.0–12.0)

## 2021-11-13 MED ORDER — CARBAMAZEPINE 100 MG PO CHEW
200.0000 mg | CHEWABLE_TABLET | Freq: Two times a day (BID) | ORAL | Status: DC
  Administered 2021-11-13 – 2021-11-16 (×6): 200 mg via ORAL
  Filled 2021-11-13 (×11): qty 2

## 2021-11-13 NOTE — [facility] Group Notes (Signed)
.  Psychoeducational Group Note ? ?Date: 11/13/2021 ?Time: 0900-1000 ? ? ? ?Goal Setting  ? ?Purpose of Group: This group helps to provide patients with the steps of setting a goal that is specific, measurable, attainable, realistic and time specific. A discussion on how we keep ourselves stuck with negative self talk. Homework given for Patients to write 30 positive attributes about themselves. ? ? ? ?Participation Level: did not attend ?Lindsey Prince A ?
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
## 2021-11-13 NOTE — Progress Notes (Signed)
?   11/13/21 0500  ?Sleep  ?Number of Hours 6.75  ? ? ?

## 2021-11-13 NOTE — Progress Notes (Signed)
?   11/13/21 0904  ?Psych Admission Type (Psych Patients Only)  ?Admission Status Voluntary  ?Psychosocial Assessment  ?Patient Complaints Suspiciousness  ?Eye Contact Fair  ?Facial Expression Flat  ?Affect Blunted;Flat  ?Speech Logical/coherent  ?Interaction Cautious  ?Motor Activity Other (Comment) ?(WNL)  ?Appearance/Hygiene Unremarkable  ?Behavior Characteristics Calm;Cooperative  ?Mood Euthymic  ?Thought Process  ?Coherency Circumstantial  ?Content Blaming others  ?Delusions None reported or observed  ?Perception WDL  ?Hallucination None reported or observed  ?Judgment WDL  ?Confusion None  ?Danger to Self  ?Current suicidal ideation? Denies  ?Danger to Others  ?Danger to Others None reported or observed  ? ? ?

## 2021-11-13 NOTE — Group Note (Signed)
[facility] LCSW Group Therapy Note ? ? ?Group Date: 11/13/2021 ?Start Time: 1115 ?End Time: 1200 ? ?Type of Therapy/Topic:  Group Therapy:  Feelings about Diagnosis ? ?Participation Level:  Did Not Attend  ? ? ?Description of Group:   ? This group will allow patients to explore their thoughts and feelings about diagnoses they have received. Patients will be guided to explore their level of understanding and acceptance of these diagnoses. Facilitator will encourage patients to process their thoughts and feelings about the reactions of others to their diagnosis, and will guide patients in identifying ways to discuss their diagnosis with significant others in their lives. This group will be process-oriented, with patients participating in exploration of their own experiences as well as giving and receiving support and challenge from other group members. ? ? ?Therapeutic Goals: ?1. Patient will demonstrate understanding of diagnosis as evidence by identifying two or more symptoms of the disorder:  ?2. Patient will be able to express two feelings regarding the diagnosis ?3. Patient will demonstrate ability to communicate their needs through discussion and/or role plays ? ?Summary of Patient Progress: ? ? ? ?The patient did not attend group, although she was invited. ? ? ? ?Therapeutic Modalities:   ?Cognitive Behavioral Therapy ?Brief Therapy ?Feelings Identification  ? ? ?Maretta Los, LCSW ?

## 2021-11-13 NOTE — [facility] Group Notes (Signed)
Adult Psychoeducational Group Note ? ?Date:  11/13/2021 ?Time:  9:12 PM ? ?Group Topic/Focus:  ?Wrap-Up Group:   The focus of this group is to help patients review their daily goal of treatment and discuss progress on daily workbooks. ? ?Participation Level:  Active ? ?Participation Quality:  Appropriate and Attentive ? ?Affect:  Appropriate ? ?Cognitive:  Alert and Appropriate ? ?Insight: Appropriate and Good ? ?Engagement in Group:  Engaged ? ?Modes of Intervention:  Discussion and Education ? ?Additional Comments:  Pt attended and participated in wrap up group this evening and rated their day a 6/10. Pt stated that they are experiencing anxiety throughout the day, but will "leave it up to the Dr." To manage their anxiety levels. Pt has a goal to "maintain their composure" and explained that that entails controlling their "train of thought and eye rolling". Pt does not have a discharge date that they are aware of at the moment.  ? ?Lindsey Prince ?11/13/2021, 9:12 PM ?

## 2021-11-13 NOTE — Plan of Care (Signed)
?  Problem: Coping: ?Goal: Ability to demonstrate self-control will improve ?Outcome: Progressing ?  ?Problem: Health Behavior/Discharge Planning: ?Goal: Compliance with treatment plan for underlying cause of condition will improve ?Outcome: Progressing ?  ?Problem: Safety: ?Goal: Periods of time without injury will increase ?Outcome: Progressing ?  ?Problem: Coping: ?Goal: Coping ability will improve ?Outcome: Progressing ?  ?Problem: Coping: ?Goal: Will verbalize feelings ?Outcome: Progressing ?  ?

## 2021-11-14 DIAGNOSIS — F319 Bipolar disorder, unspecified: Secondary | ICD-10-CM | POA: Diagnosis not present

## 2021-11-14 MED ORDER — OLANZAPINE 7.5 MG PO TABS
15.0000 mg | ORAL_TABLET | Freq: Every day | ORAL | Status: DC
  Administered 2021-11-14 – 2021-11-20 (×7): 15 mg via ORAL
  Filled 2021-11-14 (×10): qty 2

## 2021-11-14 NOTE — Progress Notes (Signed)
Pt denies SI/HI/AVH and verbally agrees to approach staff if these become apparent or before harming themselves/others. Rates depression 0/10. Rates anxiety 10/10. Rates pain 0/10. Pt has been in her room for the majority of the day but has been seen out of the room a few times. Pt has not been nearly as labile and has improved in her mood. Pt stated this evening that she had a decent/good day. Scheduled medications administered to pt, per MD orders. RN provided support and encouragement to pt. Q15 min safety checks implemented and continued. Pt safe on the unit. RN will continue to monitor and intervene as needed.  ? 11/14/21 0752  ?Psych Admission Type (Psych Patients Only)  ?Admission Status Voluntary  ?Psychosocial Assessment  ?Patient Complaints Anxiety  ?Eye Contact Fair  ?Facial Expression Flat  ?Affect Flat  ?Speech Logical/coherent  ?Interaction Isolative  ?Motor Activity Other (Comment) ?(WDL)  ?Appearance/Hygiene Unremarkable  ?Behavior Characteristics Cooperative;Calm  ?Mood Pleasant;Euthymic  ?Thought Process  ?Coherency WDL  ?Content WDL;Blaming others  ?Delusions None reported or observed  ?Perception WDL  ?Hallucination None reported or observed  ?Judgment WDL  ?Confusion None  ?Danger to Self  ?Current suicidal ideation? Denies  ?Danger to Others  ?Danger to Others None reported or observed  ? ? ?

## 2021-11-14 NOTE — Progress Notes (Signed)
Adult Psychoeducational Group Note ? ?Date:  11/14/2021 ?Time:  8:48 PM ? ?Group Topic/Focus:  ?Wrap-Up Group:   The focus of this group is to help patients review their daily goal of treatment and discuss progress on daily workbooks. ? ?Participation Level:  Active ? ?Participation Quality:  Appropriate ? ?Affect:  Depressed, Flat, and Irritable ? ?Cognitive:  Appropriate ? ?Insight: Appropriate ? ?Engagement in Group:  Limited ? ?Modes of Intervention:  Discussion ? ?Additional Comments:  Pt stated her goal for today was to focus on her treatment plan. Pt stated she accomplished her goal today. Pt stated she talked with her doctor but did not get a chance to speak with her social worker about her care today. Pt rated her overall day a 7 out of 10. Pt stated she made no calls today. Pt stated she felt better about herself tonight. Pt stated she was able to attend all meals today. Pt stated she took all medications provided today. Pt stated her appetite was pretty good today. Pt rated her sleep last night was pretty good. Pt stated the goal tonight was to get some rest. Pt stated she had no physical pain tonight. Pt deny visual hallucinations and auditory issues tonight. Pt denies thoughts of harming herself or others. Pt stated she would alert staff if anything changed. ? ?Candy Sledge ?11/14/2021, 8:48 PM ?

## 2021-11-14 NOTE — Progress Notes (Signed)
Kayann was up and visible on the unit this evening.  She did attend evening wrap up group with good participation.  Minimal in interactions with staff but was pleasant and cooperative.  She denied SI/HI or AVH.  She stated her day was better than yesterday and rated overall day 7/10 (10 the best).  She denied any pain or discomfort and appeared to be in no physical distress.  She voiced no questions or issues at this time.  She is currently resting with her eyes closed and appears to be asleep.  Q 15 minute checks maintained for safety.    ? ? 11/14/21 2118  ?Psych Admission Type (Psych Patients Only)  ?Admission Status Voluntary  ?Psychosocial Assessment  ?Patient Complaints Depression  ?Eye Contact Fair  ?Facial Expression Flat  ?Affect Flat  ?Speech Logical/coherent  ?Interaction Forwards little;Isolative  ?Motor Activity Slow  ?Appearance/Hygiene Unremarkable  ?Behavior Characteristics Cooperative;Calm  ?Mood Pleasant  ?Thought Process  ?Coherency WDL  ?Content Blaming others  ?Delusions None reported or observed  ?Perception WDL  ?Hallucination None reported or observed  ?Judgment WDL  ?Confusion None  ?Danger to Self  ?Current suicidal ideation? Denies  ?Danger to Others  ?Danger to Others None reported or observed  ? ? ?

## 2021-11-14 NOTE — Progress Notes (Signed)
[facility] MD Progress Note  11/14/2021 4:43 PM [facility]  MRN:  [id number] Subjective:  Patient is a 36 year old female with past psychiatric history of bipolar 1 disorder, PTSD and history of psychosis who initially presented unaccompanied to [facility] ED with complaints of anxiety, auditory hallucinations, paranoia and mood lability.  Chart review from last 24 hours-The patient's chart was reviewed and nursing notes were reviewed. Patient discussed in progression rounds with treatment team. MAR was reviewed and Pt was complaint  with scheduled medications and required PRN medications: none    Pt was seen today on rounds. She states that her mood is "up and down" again today. She has been writing about her thoughts and feelings in her journal. She has been sleeping and eating well. She denies hallucinations, thoughts of harm to self or others. She still is worried about "the main culprit of al my distress" and appears to have paranoia about that. She now wishes to have someone to believe in her, and to listen to her thoughts and feelings. She does not believe that she pushes people away, and believes that she is a selfless person. She admits that she is an anxious person, but otherwise does not appear to take a very earnest self-inventory, nor does she appear to recognize limitations to her ability to communicate. She feels more safe on the unit during the day. She feels less so at night, believes that it is the staff that is more intimidating.   Principal Problem: Severe bipolar I disorder, current or most recent episode depressed ([facility]) Diagnosis: Principal Problem:   Severe bipolar I disorder, current or most recent episode depressed ([facility]) Active Problems:   PTSD (post-traumatic stress disorder)   Bipolar I, most recent episode mixed, severe with psychotic behavior ([facility])   Bipolar 1 disorder ([facility])  Total Time spent with patient: 30 minutes I personally spent 30 minutes on the unit in direct  patient care. The direct patient care time included face-to-face time with the patient, reviewing the patient's chart, communicating with other professionals, and coordinating care. Greater than 50% of this time was spent in counseling or coordinating care with the patient regarding goals of hospitalization, psycho-education, and discharge planning needs.  Past Psychiatric History: see H&P  Past Medical History:  Past Medical History:  Diagnosis Date   Anemia    Herpes     Past Surgical History:  Procedure Laterality Date   NO PAST SURGERIES     Family History:  Family History  Problem Relation Age of Onset   Healthy Neg Hx    Family Psychiatric  History: see H&P Social History:  Social History   Substance and Sexual Activity  Alcohol Use No     Social History   Substance and Sexual Activity  Drug Use Yes   Types: Marijuana    Social History   Socioeconomic History   Marital status: Single    Spouse name: Not on file   Number of children: Not on file   Years of education: Not on file   Highest education level: Not on file  Occupational History   Not on file  Tobacco Use   Smoking status: Every Day    Packs/day: 1.50    Types: Cigarettes   Smokeless tobacco: Never  Substance and Sexual Activity   Alcohol use: No   Drug use: Yes    Types: Marijuana   Sexual activity: Not Currently  Other Topics Concern   Not on file  Social History Narrative  Not on file   Social Determinants of Health   Financial Resource Strain: Not on file  Food Insecurity: Not on file  Transportation Needs: Not on file  Physical Activity: Not on file  Stress: Not on file  Social Connections: Not on file   Additional Social History:                         Sleep: Good  Appetite:  Good  Current Medications: Current Facility-Administered Medications  Medication Dose Route Frequency Provider Last Rate Last Admin   acetaminophen (TYLENOL) tablet 650 mg  650 mg Oral  Q6H PRN Rankin, [name], NP   650 mg at 11/12/21 2048   alum & mag hydroxide-simeth (MAALOX/MYLANTA) 200-200-20 MG/5ML suspension 30 mL  30 mL Oral Q4H PRN Rankin, [name], NP   30 mL at 11/14/21 1429   benztropine (COGENTIN) tablet 0.5 mg  0.5 mg Oral BID PRN Rankin, [name], NP       carbamazepine (TEGRETOL) chewable tablet 200 mg  200 mg Oral BID Yomaira Solar, Jackie Plum, MD   200 mg at 11/14/21 1620   magnesium hydroxide (MILK OF MAGNESIA) suspension 30 mL  30 mL Oral Daily PRN Rankin, [name], NP       metroNIDAZOLE (FLAGYL) tablet 500 mg  500 mg Oral Q12H Doda, Vandana, MD   500 mg at 11/14/21 0752   OLANZapine (ZYPREXA) tablet 10 mg  10 mg Oral QHS Armando Reichert, MD   10 mg at 11/13/21 2036   propranolol (INDERAL) tablet 10 mg  10 mg Oral TID Rankin, [name], NP   10 mg at 11/14/21 1620   traZODone (DESYREL) tablet 50 mg  50 mg Oral QHS PRN Armando Reichert, MD        Lab Results:  Results for orders placed or performed during the hospital encounter of 11/08/21 (from the past 48 hour(s))  Carbamazepine level, total     Status: Abnormal   Collection Time: 11/13/21  6:35 AM  Result Value Ref Range   Carbamazepine Lvl 3.7 (L) 4.0 - 12.0 ug/mL    Comment: Performed at [facility], [place] [address], [place], [place] [id number]  Basic metabolic panel     Status: None   Collection Time: 11/13/21  6:35 AM  Result Value Ref Range   Sodium 138 135 - 145 mmol/L   Potassium 4.2 3.5 - 5.1 mmol/L   Chloride 102 98 - 111 mmol/L   CO2 26 22 - 32 mmol/L   Glucose, Bld 82 70 - 99 mg/dL    Comment: Glucose reference range applies only to samples taken after fasting for at least 8 hours.   BUN 11 6 - 20 mg/dL   Creatinine, Ser 0.79 0.44 - 1.00 mg/dL   Calcium 8.9 8.9 - 10.3 mg/dL   GFR, Estimated >60 >60 mL/min    Comment: (NOTE) Calculated using the CKD-EPI Creatinine Equation (2021)    Anion gap 10 5 - 15    Comment: Performed at [facility], [place] [address],  [place], [place] [address]  CBC with Differential/Platelet     Status: Abnormal   Collection Time: 11/13/21  6:35 AM  Result Value Ref Range   WBC 8.1 4.0 - 10.5 K/uL   RBC 4.41 3.87 - 5.11 MIL/uL   Hemoglobin 14.3 12.0 - 15.0 g/dL   HCT 42.8 36.0 - 46.0 %   MCV 97.1 80.0 - 100.0 fL   MCH  32.4 26.0 - 34.0 pg   MCHC 33.4 30.0 - 36.0 g/dL   RDW 12.8 11.5 - 15.5 %   Platelets 432 (H) 150 - 400 K/uL   nRBC 0.0 0.0 - 0.2 %   Neutrophils Relative % 44 %   Neutro Abs 3.6 1.7 - 7.7 K/uL   Lymphocytes Relative 41 %   Lymphs Abs 3.3 0.7 - 4.0 K/uL   Monocytes Relative 10 %   Monocytes Absolute 0.8 0.1 - 1.0 K/uL   Eosinophils Relative 4 %   Eosinophils Absolute 0.3 0.0 - 0.5 K/uL   Basophils Relative 1 %   Basophils Absolute 0.1 0.0 - 0.1 K/uL   Immature Granulocytes 0 %   Abs Immature Granulocytes 0.03 0.00 - 0.07 K/uL    Comment: Performed at [facility], [place] [address], [place], [place] [address]     Blood Alcohol level:  Lab Results  Component Value Date   [facility] <10 11/06/2021   ETH <10 [id number]    Metabolic Disorder Labs: Lab Results  Component Value Date   HGBA1C 5.4 01/24/2021   MPG 108.28 01/24/2021   No results found for: PROLACTIN Lab Results  Component Value Date   CHOL 145 01/24/2021   TRIG 125 01/24/2021   HDL 73 01/24/2021   CHOLHDL 2.0 01/24/2021   VLDL 25 01/24/2021   LDLCALC 47 01/24/2021    Physical Findings: AIMS: Facial and Oral Movements Muscles of Facial Expression: None, normal Lips and Perioral Area: None, normal Jaw: None, normal Tongue: None, normal,Extremity Movements Upper (arms, wrists, hands, fingers): None, normal Lower (legs, knees, ankles, toes): None, normal, Trunk Movements Neck, shoulders, hips: None, normal, Overall Severity Severity of abnormal movements (highest score from questions above): None, normal Incapacitation due to abnormal movements: None, normal Patient's awareness of abnormal movements (rate only  patient's report): No Awareness, Dental Status Current problems with teeth and/or dentures?: No Does patient usually wear dentures?: No  CIWA:    COWS:     Musculoskeletal: Strength & Muscle Tone: within normal limits Gait & Station: normal Patient leans: N/A  Psychiatric Specialty Exam:  Presentation  General Appearance: Appropriate for Environment  Eye Contact:Fleeting  Speech:Clear and Coherent  Speech Volume:Normal  Handedness:Right   Mood and Affect  Mood:Anxious; Dysphoric; Labile  Affect:Labile   Thought Process  Thought Processes:-- (partially linear, can derail)  Descriptions of Associations:Loose  Orientation:Partial  Thought Content:Rumination  History of Schizophrenia/Schizoaffective disorder:No  Duration of Psychotic Symptoms:Greater than six months  Hallucinations:Hallucinations: None  Ideas of Reference:Delusions; Paranoia  Suicidal Thoughts:Suicidal Thoughts: No  Homicidal Thoughts:Homicidal Thoughts: No   Sensorium  Memory:Immediate Fair; Recent Fair; Remote Fair  Judgment:Poor  Insight:Poor   Executive Functions  Concentration:-- (limited)  Attention Span:-- (limited)  Recall:-- (limited)  Fund of Knowledge:Fair  Language:Good   Psychomotor Activity  Psychomotor Activity:Psychomotor Activity: Normal   Assets  Assets:Leisure Time; Physical Health   Sleep  Sleep:Sleep: Good    Physical Exam: Physical Exam Vitals and nursing note reviewed.  Constitutional:      General: She is not in acute distress.    Appearance: Normal appearance. She is not ill-appearing, toxic-appearing or diaphoretic.  HENT:     Head: Normocephalic.  Eyes:     Extraocular Movements: Extraocular movements intact.  Pulmonary:     Effort: Pulmonary effort is normal.  Neurological:     Mental Status: She is alert and oriented to person, place, and time.   Review of Systems  Constitutional:  Negative for fever.  Respiratory:   Negative for cough and shortness of breath.   Cardiovascular:  Negative for chest pain.  Neurological:  Negative for dizziness and headaches.  Psychiatric/Behavioral:  Positive for substance abuse. Negative for hallucinations and suicidal ideas. The patient is nervous/anxious. The patient does not have insomnia.   Blood pressure 116/86, pulse 74, temperature 97.8 F (36.6 C), temperature source Oral, resp. rate 16, height 5\' 2"  (1.575 m), weight 57.6 kg, SpO2 100 %. Body mass index is 23.23 kg/m.   Treatment Plan Summary:Patient is a 36 year old female with past psychiatric history of bipolar 1 disorder, PTSD and history of psychosis who initially presented unaccompanied to [facility] with complaints of anxiety, auditory hallucinations, paranoia and mood lability.  Daily contact with patient to assess and evaluate symptoms and progress in treatment   Labs reviewed  New Labs: Getting carbamazepine level, CBC and BMP tomorrow morning  Safety and Monitoring -- Involuntary admission to inpatient psychiatric unit for safety, stabilization and treatment -- Daily contact with patient to assess and evaluate symptoms and progress in treatment -- Patient's case to be discussed in multi-disciplinary team meeting. -- Patient will be encouraged to participate in the therapeutic group milieu. -- Observation Level : q15 minute checks -- Vital signs:  q12 hours -- Precautions: suicide, elopement, assault  Plan  -Monitor Vitals. -Monitor for Suicidal Ideation. -Monitor for withdrawal symptoms. -Monitor for medication side effects.   Bipolar 1, current episode depressed, severe with psychotic features PTSD -Continue benztropine 0.5 mg twice daily -continueTegretol 200 mg twice daily.  (carbamazepine level 11/13/21=3.7).  -Continue  propanolol 10 mg 3 times daily -Increase Zyprexa 15 mg nightly. -Stopped risperidone due to being ineffective  -Encouraged compliance with medications.  Trichomonal  infection Continue Flagyl 500 mg twice daily for 7 days. Continue to monitor symptoms.   Marijuana abuse -Encouraged cessation.   PRN's  -Continue Tylenol 650 mg every 6 hours as needed for pain or fever -Continue Milk of Magnesia 30 ml PRN Daily for Constipation. -Continue Maalox/Mylanta 30 ml Q4H PRN for Indigestion. -Continue Hydroxyzine 25 mg TID PRN for Anxiety. -Continue Trazodone 50 mg QHS PRN for sleep.    Discharge Planning: Social work and case management to assist with discharge planning and identification of hospital follow-up needs prior to discharge Estimated LOS: 7-10 days Discharge Concerns: Need to establish a safety plan; Medication compliance and effectiveness Discharge Goals: Return home with outpatient referrals for mental health follow-up including medication management/psychotherapy.  Maida Sale, MD 11/14/2021, 4:43 PM

## 2021-11-14 NOTE — Group Note (Signed)
Date:  11/14/2021 ?Time:  11:24 AM ? ?Group Topic/Focus:  ?Orientation:   The focus of this group is to educate the patient on the purpose and policies of crisis stabilization and provide a format to answer questions about their admission.  The group details unit policies and expectations of patients while admitted. ? ? ? ?Participation Level:  Active ? ?Participation Quality:  Appropriate ? ?Affect:  Appropriate ? ?Cognitive:  Appropriate ? ?Insight: Appropriate ? ?Engagement in Group:  Limited ? ?Modes of Intervention:  Discussion ? ?Additional Comments:   ? ?Jaquita Rector ?11/14/2021, 11:24 AM ? ?

## 2021-11-14 NOTE — [facility] Group Notes (Signed)
LCSW Group Therapy Note ? ?No social work group was held today due to a scheduling conflict.  ? ?Selmer Dominion, LCSW ?06/26/2021 ?5:06 PM  ?   ?

## 2021-11-15 ENCOUNTER — Encounter (HOSPITAL_COMMUNITY): Payer: Self-pay

## 2021-11-15 DIAGNOSIS — F319 Bipolar disorder, unspecified: Secondary | ICD-10-CM | POA: Diagnosis not present

## 2021-11-15 MED ORDER — HYDROXYZINE HCL 50 MG PO TABS
50.0000 mg | ORAL_TABLET | Freq: Four times a day (QID) | ORAL | Status: DC | PRN
  Administered 2021-11-15 – 2021-11-22 (×12): 50 mg via ORAL
  Filled 2021-11-15 (×5): qty 1
  Filled 2021-11-15: qty 10
  Filled 2021-11-15 (×7): qty 1

## 2021-11-15 NOTE — Progress Notes (Signed)
Adult Psychoeducational Group Note ? ?Date:  11/15/2021 ?Time:  9:01 PM ? ?Group Topic/Focus:  ?Wrap-Up Group:   The focus of this group is to help patients review their daily goal of treatment and discuss progress on daily workbooks. ? ?Participation Level:  Active ? ?Participation Quality:  Appropriate ? ?Affect:  Appropriate ? ?Cognitive:  Appropriate ? ?Insight: Appropriate ? ?Engagement in Group:  Engaged ? ?Modes of Intervention:  Discussion ? ?Additional Comments:  Pt stated her goal for today was to focus on her treatment plan. Pt stated she accomplished her goal today. Pt stated she talked with her doctor and with her social worker about her care today. Pt rated her overall day a 5 out of 10. Pt stated she made no calls today. Pt stated she felt better about herself tonight. Pt stated she was able to attend all meals today. Pt stated she took all medications provided today. Pt stated her appetite was pretty good today. Pt rated her sleep last night was pretty good. Pt stated the goal tonight was to get some rest. Pt stated she had no physical pain tonight. Pt deny visual hallucinations and auditory issues tonight. Pt denies thoughts of harming herself or others. Pt stated she would alert staff if anything changed. ? ?[facility] ?11/15/2021, 9:01 PM ?

## 2021-11-15 NOTE — BH IP Treatment Plan (Signed)
Interdisciplinary Treatment and Diagnostic Plan Update  11/15/2021 Time of Session: 11:15am [facility] MRN: [id number]  Principal Diagnosis: Severe bipolar I disorder, current or most recent episode depressed ([facility])  Secondary Diagnoses: Principal Problem:   Severe bipolar I disorder, current or most recent episode depressed ([facility]) Active Problems:   PTSD (post-traumatic stress disorder)   Bipolar I, most recent episode mixed, severe with psychotic behavior ([facility])   Bipolar 1 disorder ([facility])   Current Medications:  Current Facility-Administered Medications  Medication Dose Route Frequency Provider Last Rate Last Admin   acetaminophen (TYLENOL) tablet 650 mg  650 mg Oral Q6H PRN Rankin, [name], NP   650 mg at 11/12/21 2048   alum & mag hydroxide-simeth (MAALOX/MYLANTA) 200-200-20 MG/5ML suspension 30 mL  30 mL Oral Q4H PRN Rankin, [name], NP   30 mL at 11/14/21 1429   benztropine (COGENTIN) tablet 0.5 mg  0.5 mg Oral BID PRN Rankin, [name], NP       carbamazepine (TEGRETOL) chewable tablet 200 mg  200 mg Oral BID Hill, Shelbie Hutching, MD   200 mg at 11/15/21 0739   hydrOXYzine (ATARAX) tablet 50 mg  50 mg Oral Q6H PRN Roselle Locus, MD   50 mg at 11/15/21 1128   magnesium hydroxide (MILK OF MAGNESIA) suspension 30 mL  30 mL Oral Daily PRN Rankin, [name], NP       metroNIDAZOLE (FLAGYL) tablet 500 mg  500 mg Oral Q12H Doda, Vandana, MD   500 mg at 11/15/21 0739   OLANZapine (ZYPREXA) tablet 15 mg  15 mg Oral QHS Hill, Shelbie Hutching, MD   15 mg at 11/14/21 2118   propranolol (INDERAL) tablet 10 mg  10 mg Oral TID Rankin, [name], NP   10 mg at 11/15/21 0739   traZODone (DESYREL) tablet 50 mg  50 mg Oral QHS PRN Karsten Ro, MD       PTA Medications: Medications Prior to Admission  Medication Sig Dispense Refill Last Dose   benztropine (COGENTIN) 0.5 MG tablet Take 1 tablet (0.5 mg total) by mouth 2 (two) times daily as needed for tremors. (Patient not taking:  Reported on 11/06/2021) 45 tablet 0    carbamazepine (TEGRETOL) 100 MG chewable tablet Chew 1 tablet (100 mg total) by mouth 2 (two) times daily. (Patient not taking: Reported on 11/06/2021) 60 tablet 0    ibuprofen (ADVIL) 200 MG tablet Take 400-800 mg by mouth every 6 (six) hours as needed for moderate pain or headache.      loratadine (CLARITIN) 10 MG tablet Take 1 tablet (10 mg total) by mouth daily. 30 tablet 0    propranolol (INDERAL) 10 MG tablet Take 1 tablet (10 mg total) by mouth 3 (three) times daily. (Patient not taking: Reported on 11/06/2021) 90 tablet 0    risperiDONE (RISPERDAL M-TABS) 2 MG disintegrating tablet Take 1 tablet (2 mg total) by mouth at bedtime. (Patient not taking: Reported on 11/06/2021) 30 tablet 0    traZODone (DESYREL) 100 MG tablet Take 1 tablet (100 mg total) by mouth at bedtime as needed for sleep. 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   Physician Treatment Plan for Primary Diagnosis: Severe bipolar I disorder, current or most recent episode depressed (HCC) Long Term Goal(s): Improvement in symptoms so as ready for discharge   Short Term Goals: Ability to identify changes in lifestyle to reduce recurrence of condition will improve Ability to verbalize feelings will improve Ability to disclose and discuss suicidal ideas Ability to demonstrate self-control will improve Ability to identify and develop effective coping behaviors will improve Ability to maintain clinical measurements within normal limits will improve Compliance with prescribed medications  will improve Ability to identify triggers associated with substance abuse/mental health issues will improve  Medication Management: Evaluate patient's response, side effects, and tolerance of medication regimen.  Therapeutic Interventions: 1 to 1 sessions, Unit Group sessions and Medication administration.  Evaluation of Outcomes: Progressing  Physician Treatment Plan for Secondary Diagnosis: Principal Problem:   Severe bipolar I disorder, current or most recent episode depressed (HCC) Active Problems:   PTSD (post-traumatic stress disorder)   Bipolar I, most recent episode mixed, severe with psychotic behavior (HCC)   Bipolar 1 disorder (HCC)  Long Term Goal(s): Improvement in symptoms so as ready for discharge   Short Term Goals: Ability to identify changes in lifestyle to reduce recurrence of condition will improve Ability to verbalize feelings will improve Ability to disclose and discuss suicidal ideas Ability to demonstrate self-control will improve Ability to identify and develop effective coping behaviors will improve Ability to maintain clinical measurements within normal limits will improve Compliance with prescribed medications will improve Ability to identify triggers associated with substance abuse/mental health issues will improve     Medication Management: Evaluate patient's response, side effects, and tolerance of medication regimen.  Therapeutic Interventions: 1 to 1 sessions, Unit Group sessions and Medication administration.  Evaluation of Outcomes: Progressing   RN Treatment Plan for Primary Diagnosis: Severe bipolar I disorder, current or most recent episode depressed (HCC) Long Term Goal(s): Knowledge of disease and therapeutic regimen to maintain health will improve  Short Term Goals: Ability to remain free from injury will improve, Ability to verbalize frustration and anger appropriately will improve, Ability to demonstrate self-control, Ability to  participate in decision making will improve, and Compliance with prescribed medications will improve  Medication Management: RN will administer medications as ordered by provider, will assess and evaluate patient's response and provide education to patient for prescribed medication. RN will report any adverse and/or side effects to prescribing provider.  Therapeutic Interventions: 1 on 1 counseling sessions, Psychoeducation, Medication administration, Evaluate responses to treatment, Monitor vital signs and CBGs as ordered, Perform/monitor CIWA, COWS, AIMS and Fall Risk screenings as ordered, Perform wound care treatments as ordered.  Evaluation of Outcomes: Progressing   LCSW Treatment Plan for Primary Diagnosis: Severe bipolar I disorder, current or most recent episode depressed (HCC) Long Term Goal(s): Safe transition to appropriate next level of care at discharge, Engage patient in therapeutic group addressing interpersonal concerns.  Short Term Goals: Engage patient in aftercare planning with referrals and resources, Increase social support, Increase ability to appropriately verbalize feelings, Identify triggers associated with mental health/substance abuse issues, and Increase skills for wellness and recovery  Therapeutic Interventions: Assess for all discharge needs, 1 to 1 time with Social worker, Explore available resources and support systems, Assess for adequacy in community support network, Educate family and significant other(s) on suicide prevention, Complete Psychosocial Assessment, Interpersonal group therapy.  Evaluation of Outcomes: Progressing   Progress in Treatment: Attending groups: Yes. Participating in groups: Yes.  Taking medication as prescribed: Yes. Toleration medication: Yes. Family/Significant other contact made: No, will contact:  Declined Consents  Patient understands diagnosis: No. Discussing patient identified problems/goals with staff: Yes. Medical  problems stabilized or resolved: Yes. Denies suicidal/homicidal ideation: Yes. Issues/concerns per patient self-inventory: No.     New problem(s) identified: No, Describe:  None    New Short Term/Long Term Goal(s): medication stabilization, elimination of SI thoughts, development of comprehensive mental wellness plan.    Patient Goals: "To stay safe"   Discharge Plan or Barriers: Patient recently admitted. CSW will continue to follow and assess for appropriate referrals and possible discharge planning.    Reason for Continuation of Hospitalization: Anxiety Depression Hallucinations Medication stabilization    Estimated Length of Stay: 3 to 5 days    Scribe for Treatment Team: Otelia SanteeMeredith A Jonica Bickhart, LCSW 11/15/2021 11:50 AM

## 2021-11-15 NOTE — Progress Notes (Signed)
Pt was encouraged but didn't attend therapeutic relaxation group. ?

## 2021-11-15 NOTE — Group Note (Signed)
Recreation Therapy Group Note ? ? ?Group Topic:Coping Skills  ?Group Date: 11/15/2021 ?Start Time: 1000 ?End Time: 1035 ?Facilitators: Caroll Rancher, LRT,CTRS ?Location: [address] ? ? ?Goal Area(s) Addresses:  ?Patient will identify positive coping skills. ?Patient will identify benefits of using coping skills post d/c. ? ?Group Description:  Mind Map.  Patient was provided a blank template of a diagram with 32 blank boxes in a tiered system, branching from the center (similar to a bubble chart). LRT directed patients to label the middle of the diagram "Coping Skills" and consider 8 different challenges that would require coping skills.  Patients and LRT filled in the 2nd tier of boxes with the challenges that were identified (anxiety, depression, anger, stress, happy, suicidal thoughts, gestures and finances).  Patients were to then come up with 3 coping skills that could be used for each.  When patients finished, LRT would write the coping skills on the board and patients could fill in any blank spots they may have had on their sheets.    ? ? ?Affect/Mood: Appropriate ?  ?Participation Level: Active ?  ?Participation Quality: Independent ?  ?Behavior: Attentive  ?  ?Speech/Thought Process: Focused ?  ?Insight: Good ?  ?Judgement: Good ?  ?Modes of Intervention: Worksheet ?  ?Patient Response to Interventions:  Attentive ?  ?Education Outcome: ? Acknowledges education and In group clarification offered   ? ?Clinical Observations/Individualized Feedback: Pt was bright and attentive during group session. Pt listened to the suggestions of peers.  Pt did contribute some coping skills such as pay bills, medication and working out.  Pt was pleasant and appropriate during group session.   ? ? ?Plan: Continue to engage patient in RT group sessions 2-3x/week. ? ? ?Caroll Rancher, LRT,CTRS ?11/15/2021 12:22 PM ?

## 2021-11-15 NOTE — Progress Notes (Signed)
Pt was encouraged but didn't attend orientation/goals group. ?

## 2021-11-15 NOTE — Progress Notes (Signed)
?   11/15/21 2012  ?Psych Admission Type (Psych Patients Only)  ?Admission Status Voluntary  ?Psychosocial Assessment  ?Patient Complaints Anxiety;Depression  ?Eye Contact Brief  ?Facial Expression Anxious  ?Affect Anxious  ?Speech Logical/coherent  ?Interaction Guarded;Forwards little;Assertive  ?Motor Activity Other (Comment) ?(wnl)  ?Appearance/Hygiene Unremarkable  ?Behavior Characteristics Anxious;Guarded  ?Mood Depressed;Anxious;Labile  ?Thought Process  ?Coherency WDL  ?Content Preoccupation  ?Delusions None reported or observed  ?Perception WDL  ?Hallucination None reported or observed  ?Judgment Poor  ?Confusion None  ?Danger to Self  ?Current suicidal ideation? Denies  ?Danger to Others  ?Danger to Others None reported or observed  ? ?Pt seen sitting in her room on the bench. Pt denies SI, HI, AVH and pain. Pt rates anxiety 7/10 and depression 10/10. When asked about her depression, pt states it is related to an "incident' that happened. Pt was asked about this incident and this writer was told, "I don't want to talk about that." No more information was obtained about the incident that led to such a high depression score. No other complaints noted. ?

## 2021-11-15 NOTE — Group Note (Signed)
LCSW Group Therapy Note ? ? ?Group Date: 11/15/2021 ?Start Time: 1300 ?End Time: 1400 ? ? ?Type of Therapy and Topic:  Group Therapy: Urge Surfing: Triggers ? ?Participation Level:  None ? ?Description of Group: Recognizing Triggers: Patients defined triggers and discussed the importance of recognizing their personal warning signs. Patients identified their own triggers and how they tend to cope with stressful situations. Patients discussed areas such as people, places, things, and thoughts that trigger certain emotions for them. CSW provided support to patients and discussed safety planning for when these triggers occur. Group participants had opportunities to share openly with the group and participate in a group discussion while providing support and feedback to their peers.  Patients were provided with a technique: urge surfing, to help deal with triggers and create better outcomes for the patient. ? ? ? ?Therapeutic Goals: ? ?1.  Patients will identify a trigger that they are struggling with ?2. Patient will identify past negative outcomes to reacting to a trigger. ?3. Patient will identify emotions and feelings associated with the trigger ?4.  Patients will be introduced to Urge Surfing technique to assist with getting through triggers.  ? ? ?Summary of Patient Progress:   ? ?Lindsey Prince came to group and immediately left.  She stated, "I have too many triggers so I will not do be doing this" ? ? ? ?Bradan Congrove E Kameria Canizares, LCSWA ?11/15/2021  1:44 PM   ? ?

## 2021-11-15 NOTE — Progress Notes (Signed)
Pt denies SI/HI/AVH and verbally agrees to approach staff if these become apparent or before harming themselves/others. Rates depression 0/10. Rates anxiety 4/10. Rates pain 0/10. Pt had a little more anxiety in the middle of the day and did not have anything at that time. Pt became irritable at that moment. RN was able to get a PRN ordered and pt was appreciative. Pt has been in her room for a lot of the day. Pt stated this morning that she had felt better and her anxiety was the lowest it has been in a while. Pt has been more cooperative, calm, and kind. Scheduled medications administered to pt, per MD orders. RN provided support and encouragement to pt. Q15 min safety checks implemented and continued. Pt safe on the unit. RN will continue to monitor and intervene as needed.  ? 11/15/21 0739  ?Psych Admission Type (Psych Patients Only)  ?Admission Status Voluntary  ?Psychosocial Assessment  ?Patient Complaints Anxiety  ?Eye Contact Fair  ?Facial Expression Flat;Anxious  ?Affect Anxious;Appropriate to circumstance;Flat  ?Speech Logical/coherent  ?Interaction Forwards little;Assertive;Isolative  ?Motor Activity Other (Comment) ?(WDL)  ?Appearance/Hygiene Unremarkable  ?Behavior Characteristics Cooperative;Appropriate to situation;Anxious;Calm  ?Mood Pleasant;Anxious  ?Thought Process  ?Coherency WDL  ?Content Blaming others  ?Delusions None reported or observed  ?Perception WDL  ?Hallucination None reported or observed  ?Judgment Impaired  ?Confusion None  ?Danger to Self  ?Current suicidal ideation? Denies  ?Danger to Others  ?Danger to Others None reported or observed  ? ? ?

## 2021-11-15 NOTE — Progress Notes (Signed)
[facility] MD Progress Note  11/15/2021 11:37 AM Lindsey Prince  MRN:  [id number] Subjective:  Patient is a 36 year old female with past psychiatric history of bipolar 1 disorder, PTSD and history of psychosis who initially presented unaccompanied to [facility] with complaints of anxiety, auditory hallucinations, paranoia and mood lability.  Chart review from last 24 hours-The patient's chart was reviewed and nursing notes were reviewed. Patient discussed in progression rounds with treatment team. MAR was reviewed and Pt was complaint  with scheduled medications and required PRN medications: none    Pt was seen today on rounds. Lindsey Prince states that her mood is "getting better". Lindsey Prince was defensive initially about if Lindsey Prince can get better and what Lindsey Prince can change. The first few minutes Lindsey Prince had an argument pretty much by herself. Lindsey Prince insisted that it didn't matter if medications helped because Lindsey Prince couldn't afford them anyway, so there was no point getting 'hooked' on them. Lindsey Prince was tearful when Lindsey Prince explained that Lindsey Prince knew that her daughter was going to be okay, even though Lindsey Prince was not with her, and that Lindsey Prince wasn't going to try to see her because it was better for her this way.   After Lindsey Prince ran out of defenses we discussed that there is a difference between controlling symptoms of a disease and being addicted to something. I talked about some of the medication studies they are doing and recent trials they Lindsey Prince can look into enrolling in where Lindsey Prince might get to do it for free or even get paid. Lindsey Prince was tearful about this. After some discussion, it is my opinion that Lindsey Prince prefers psychosis and acute illness as a means of avoiding the very deep regrets and despair of losing contact and relationship with her daughter. Illness is her escape from dealing with this, in a way.   Principal Problem: Severe bipolar I disorder, current or most recent episode depressed ([place]) Diagnosis: Principal Problem:   Severe bipolar I disorder,  current or most recent episode depressed ([place]) Active Problems:   PTSD (post-traumatic stress disorder)   Bipolar I, most recent episode mixed, severe with psychotic behavior ([place])   Bipolar 1 disorder ([place])  Total Time spent with patient: 30 minutes I personally spent 30 minutes on the unit in direct patient care. The direct patient care time included face-to-face time with the patient, reviewing the patient's chart, communicating with other professionals, and coordinating care. Greater than 50% of this time was spent in counseling or coordinating care with the patient regarding goals of hospitalization, psycho-education, and discharge planning needs.  Past Psychiatric History: see H&P  Past Medical History:  Past Medical History:  Diagnosis Date   Anemia    Herpes     Past Surgical History:  Procedure Laterality Date   NO PAST SURGERIES     Family History:  Family History  Problem Relation Age of Onset   Healthy Neg Hx    Family Psychiatric  History: see H&P Social History:  Social History   Substance and Sexual Activity  Alcohol Use No     Social History   Substance and Sexual Activity  Drug Use Yes   Types: Marijuana    Social History   Socioeconomic History   Marital status: Single    Spouse name: Not on file   Number of children: Not on file   Years of education: Not on file   Highest education level: Not on file  Occupational History   Not on file  Tobacco Use  Smoking status: Every Day    Packs/day: 1.50    Types: Cigarettes   Smokeless tobacco: Never  Substance and Sexual Activity   Alcohol use: No   Drug use: Yes    Types: Marijuana   Sexual activity: Not Currently  Other Topics Concern   Not on file  Social History Narrative   Not on file   Social Determinants of Health   Financial Resource Strain: Not on file  Food Insecurity: Not on file  Transportation Needs: Not on file  Physical Activity: Not on file  Stress: Not on file  Social  Connections: Not on file   Additional Social History:                         Sleep: Good  Appetite:  Good  Current Medications: Current Facility-Administered Medications  Medication Dose Route Frequency Provider Last Rate Last Admin   acetaminophen (TYLENOL) tablet 650 mg  650 mg Oral Q6H PRN Rankin, [name], NP   650 mg at 11/12/21 2048   alum & mag hydroxide-simeth (MAALOX/MYLANTA) 200-200-20 MG/5ML suspension 30 mL  30 mL Oral Q4H PRN Rankin, [name], NP   30 mL at 11/14/21 1429   benztropine (COGENTIN) tablet 0.5 mg  0.5 mg Oral BID PRN Rankin, [name], NP       carbamazepine (TEGRETOL) chewable tablet 200 mg  200 mg Oral BID Shariece Viveiros, Shelbie Hutching, MD   200 mg at 11/15/21 0739   hydrOXYzine (ATARAX) tablet 50 mg  50 mg Oral Q6H PRN Roselle Locus, MD   50 mg at 11/15/21 1128   magnesium hydroxide (MILK OF MAGNESIA) suspension 30 mL  30 mL Oral Daily PRN Rankin, [name], NP       metroNIDAZOLE (FLAGYL) tablet 500 mg  500 mg Oral Q12H Doda, Vandana, MD   500 mg at 11/15/21 0739   OLANZapine (ZYPREXA) tablet 15 mg  15 mg Oral QHS Avalee Castrellon, Shelbie Hutching, MD   15 mg at 11/14/21 2118   propranolol (INDERAL) tablet 10 mg  10 mg Oral TID Rankin, [name], NP   10 mg at 11/15/21 0739   traZODone (DESYREL) tablet 50 mg  50 mg Oral QHS PRN Karsten Ro, MD        Lab Results:  No results found for this or any previous visit (from the past 48 hour(s)).    Blood Alcohol level:  Lab Results  Component Value Date   ETH <10 11/06/2021   ETH <10 01/24/2021    Metabolic Disorder Labs: Lab Results  Component Value Date   HGBA1C 5.4 01/24/2021   MPG 108.28 01/24/2021   No results found for: PROLACTIN Lab Results  Component Value Date   CHOL 145 01/24/2021   TRIG 125 01/24/2021   HDL 73 01/24/2021   CHOLHDL 2.0 01/24/2021   VLDL 25 01/24/2021   LDLCALC 47 01/24/2021    Physical Findings: AIMS: Facial and Oral Movements Muscles of Facial Expression: None,  normal Lips and Perioral Area: None, normal Jaw: None, normal Tongue: None, normal,Extremity Movements Upper (arms, wrists, hands, fingers): None, normal Lower (legs, knees, ankles, toes): None, normal, Trunk Movements Neck, shoulders, hips: None, normal, Overall Severity Severity of abnormal movements (highest score from questions above): None, normal Incapacitation due to abnormal movements: None, normal Patient's awareness of abnormal movements (rate only patient's report): No Awareness, Dental Status Current problems with teeth and/or dentures?: No Does patient usually wear dentures?: No  CIWA:  COWS:     Musculoskeletal: Strength & Muscle Tone: within normal limits Gait & Station: normal Patient leans: N/A  Psychiatric Specialty Exam:  Presentation  General Appearance: Appropriate for Environment  Eye Contact:Fleeting  Speech:Clear and Coherent  Speech Volume:Normal  Handedness:Right   Mood and Affect  Mood:Anxious; Dysphoric; Labile  Affect:Labile   Thought Process  Thought Processes:-- (partially linear, can derail)  Descriptions of Associations:Loose  Orientation:Partial  Thought Content:Rumination  History of Schizophrenia/Schizoaffective disorder:No  Duration of Psychotic Symptoms:Greater than six months  Hallucinations:No data recorded  Ideas of Reference:Delusions; Paranoia  Suicidal Thoughts:No data recorded  Homicidal Thoughts:No data recorded   Sensorium  Memory:Immediate Fair; Recent Fair; Remote Fair  Judgment:Poor  Insight:Poor   Executive Functions  Concentration:-- (limited)  Attention Span:-- (limited)  Recall:-- (limited)  Fund of Knowledge:Fair  Language:Good   Psychomotor Activity  Psychomotor Activity:No data recorded   Assets  Assets:Leisure Time; Physical Health   Sleep  Sleep:No data recorded    Physical Exam: Physical Exam Vitals and nursing note reviewed.  Constitutional:      General:  Lindsey Prince is not in acute distress.    Appearance: Normal appearance. Lindsey Prince is not ill-appearing, toxic-appearing or diaphoretic.  HENT:     Head: Normocephalic.  Eyes:     Extraocular Movements: Extraocular movements intact.  Pulmonary:     Effort: Pulmonary effort is normal.  Neurological:     Mental Status: Lindsey Prince is alert and oriented to person, place, and time.   Review of Systems  Constitutional:  Negative for fever.  Respiratory:  Negative for cough and shortness of breath.   Cardiovascular:  Negative for chest pain.  Neurological:  Negative for dizziness and headaches.  Psychiatric/Behavioral:  Positive for depression. Negative for hallucinations and suicidal ideas. The patient is nervous/anxious. The patient does not have insomnia.   Blood pressure 107/83, pulse 84, temperature 97.8 F (36.6 C), temperature source Oral, resp. rate 20, height 5\' 2"  (1.575 m), weight 57.6 kg, SpO2 100 %. Body mass index is 23.23 kg/m.   Treatment Plan Summary:Patient is a 36 year old female with past psychiatric history of bipolar 1 disorder, PTSD and history of psychosis who initially presented unaccompanied to [facility] with complaints of anxiety, auditory hallucinations, paranoia and mood lability.  Daily contact with patient to assess and evaluate symptoms and progress in treatment   Labs reviewed  New Labs: Getting carbamazepine level, CBC and BMP tomorrow morning  Safety and Monitoring -- Involuntary admission to inpatient psychiatric unit for safety, stabilization and treatment -- Daily contact with patient to assess and evaluate symptoms and progress in treatment -- Patient's case to be discussed in multi-disciplinary team meeting. -- Patient will be encouraged to participate in the therapeutic group milieu. -- Observation Level : q15 minute checks -- Vital signs:  q12 hours -- Precautions: suicide, elopement, assault  Plan  -Monitor Vitals. -Monitor for Suicidal Ideation. -Monitor for  withdrawal symptoms. -Monitor for medication side effects.   Bipolar 1, current episode depressed, severe with psychotic features PTSD -Continue benztropine 0.5 mg twice daily -continueTegretol 200 mg twice daily.  (carbamazepine level 11/13/21=3.7).  -Continue  propanolol 10 mg 3 times daily -continue Zyprexa 15 mg nightly. -Stopped risperidone due to being ineffective  -Encouraged compliance with medications.  Trichomonal infection Continue Flagyl 500 mg twice daily for 7 days. Continue to monitor symptoms.   Marijuana abuse -Encouraged cessation.   PRN's  -Continue Tylenol 650 mg every 6 hours as needed for pain or fever -Continue Milk of Magnesia  30 ml PRN Daily for Constipation. -Continue Maalox/Mylanta 30 ml Q4H PRN for Indigestion. -Continue Hydroxyzine 25 mg TID PRN for Anxiety. -Continue Trazodone 50 mg QHS PRN for sleep.    Discharge Planning: Social work and case management to assist with discharge planning and identification of hospital follow-up needs prior to discharge Estimated LOS: 7-10 days Discharge Concerns: Need to establish a safety plan; Medication compliance and effectiveness Discharge Goals: Return home with outpatient referrals for mental health follow-up including medication management/psychotherapy.  Maida Sale, MD 11/15/2021, 11:37 AM

## 2021-11-16 DIAGNOSIS — F314 Bipolar disorder, current episode depressed, severe, without psychotic features: Secondary | ICD-10-CM | POA: Diagnosis not present

## 2021-11-16 LAB — URINALYSIS, COMPLETE (UACMP) WITH MICROSCOPIC
Bilirubin Urine: NEGATIVE
Glucose, UA: NEGATIVE mg/dL
Hgb urine dipstick: NEGATIVE
Ketones, ur: NEGATIVE mg/dL
Leukocytes,Ua: NEGATIVE
Nitrite: NEGATIVE
Protein, ur: NEGATIVE mg/dL
Specific Gravity, Urine: 1.005 (ref 1.005–1.030)
pH: 8 (ref 5.0–8.0)

## 2021-11-16 MED ORDER — METRONIDAZOLE 500 MG PO TABS
500.0000 mg | ORAL_TABLET | Freq: Two times a day (BID) | ORAL | Status: DC
  Filled 2021-11-16 (×2): qty 1

## 2021-11-16 MED ORDER — MELATONIN 5 MG PO TABS
5.0000 mg | ORAL_TABLET | Freq: Every day | ORAL | Status: DC
  Administered 2021-11-16 – 2021-11-24 (×9): 5 mg via ORAL
  Filled 2021-11-16 (×4): qty 1
  Filled 2021-11-16: qty 7
  Filled 2021-11-16 (×6): qty 1
  Filled 2021-11-16: qty 7
  Filled 2021-11-16 (×2): qty 1

## 2021-11-16 MED ORDER — FLUCONAZOLE 150 MG PO TABS
150.0000 mg | ORAL_TABLET | Freq: Once | ORAL | Status: DC
  Filled 2021-11-16: qty 1

## 2021-11-16 MED ORDER — DIVALPROEX SODIUM ER 500 MG PO TB24: 1000.0000 mg | ORAL_TABLET | Freq: Every day | ORAL | Status: DC

## 2021-11-16 MED ORDER — FLUCONAZOLE 100 MG PO TABS
150.0000 mg | ORAL_TABLET | Freq: Once | ORAL | Status: AC
  Administered 2021-11-16: 150 mg via ORAL
  Filled 2021-11-16: qty 1.5
  Filled 2021-11-16: qty 2

## 2021-11-16 MED ORDER — CARBAMAZEPINE 100 MG PO CHEW: 100.0000 mg | CHEWABLE_TABLET | Freq: Two times a day (BID) | ORAL | Status: DC

## 2021-11-16 MED ORDER — DIVALPROEX SODIUM ER 500 MG PO TB24: 750.0000 mg | ORAL_TABLET | Freq: Every day | ORAL | Status: DC

## 2021-11-16 MED ORDER — DIVALPROEX SODIUM ER 500 MG PO TB24
750.0000 mg | ORAL_TABLET | Freq: Every day | ORAL | Status: DC
  Administered 2021-11-16 – 2021-11-20 (×5): 750 mg via ORAL
  Filled 2021-11-16 (×6): qty 1

## 2021-11-16 MED ORDER — CARBAMAZEPINE 100 MG PO CHEW
100.0000 mg | CHEWABLE_TABLET | Freq: Two times a day (BID) | ORAL | Status: AC
  Administered 2021-11-16 – 2021-11-19 (×7): 100 mg via ORAL
  Filled 2021-11-16 (×7): qty 1

## 2021-11-16 NOTE — Group Note (Signed)
Recreation Therapy Group Note ? ? ?Group Topic:Self-Esteem  ?Group Date: 11/16/2021 ?Start Time: 1000 ?End Time: 1100 ?Facilitators: Victorino Sparrow, LRT,CTRS ?Location: [facility] ? ? ?Goal Area(s) Addresses:  ?Patient will successfully identify positive attributes about themselves.  ?Patient will identify healthy ways to increase self-esteem. ?Patient will acknowledge benefit(s) of improved self-esteem.  ? ?Group Description:  Collage.  LRT and patients discussed the importance of positive self esteem and why it's important in day to day life.  Patients were then instructed to create collages that highlighted their positive attributes along with things that inspire them, things they have accomplished or things they hope to accomplish.  Patients were given 30 minutes the create collages with supplies available (construction paper, markers, glue sticks, safety scissors and magazines) before sharing with the group. ? ? ?Affect/Mood: N/A ?  ?Participation Level: Did not attend ?  ? ?Clinical Observations/Individualized Feedback:   ? ? ?Plan: Continue to engage patient in RT group sessions 2-3x/week. ? ? ?Victorino Sparrow, LRT,CTRS  ?11/16/2021 11:25 AM ?

## 2021-11-16 NOTE — Plan of Care (Signed)
?  Problem: Coping: ?Goal: Ability to demonstrate self-control will improve ?Outcome: Progressing ?  ?Problem: Safety: ?Goal: Periods of time without injury will increase ?Outcome: Progressing ?  ?Problem: Coping: ?Goal: Will verbalize feelings ?Outcome: Progressing ?  ?Problem: Safety: ?Goal: Ability to disclose and discuss suicidal ideas will improve ?Outcome: Progressing ?  ?

## 2021-11-16 NOTE — Progress Notes (Signed)
The focus of this group is to help patients review their daily goal of treatment and discuss progress on daily workbooks. ? ?Pt attended the evening group and responded to all discussion prompts from the Writer. Pt shared that today was a generally bad day on the unit. "All I can do now is reminisce about the good times that have already passed. These are not good times." ? ?Pt told that her goal for the coming week was to start trusting herself. "I need to listen to my gut instincts more." ? ?Pt rated her day a 5 out of 10 and she appeared depressed. ? ?

## 2021-11-16 NOTE — Progress Notes (Addendum)
Patient ID: Lindsey Prince, female   DOB: [birth year], 36 y.o.   MRN: [id number] ? ? ? ?Pt yelled out in her room while other pts were in group. MHT and RN observed pt standing in her room, anxious, and teary-eyed. Pt reports that she had a flashback of her childhood" and states "I just lose control sometimes. My childhood was really awful." Pt was given peanuts and water per her request. Pt was also offered medication. Atarax 50 mg, PRN was given. ?

## 2021-11-16 NOTE — Progress Notes (Signed)
Pt visible on the unit, pt continues to present with sad affect, pt stated she was getting worse, not better.  ? ? ? 11/16/21 2300  ?Psych Admission Type (Psych Patients Only)  ?Admission Status Voluntary  ?Psychosocial Assessment  ?Patient Complaints Anxiety;Depression  ?Eye Contact Fair  ?Facial Expression Sad  ?Affect Blunted  ?Speech Logical/coherent  ?Interaction Cautious  ?Motor Activity Slow  ?Appearance/Hygiene Unremarkable  ?Behavior Characteristics Anxious  ?Mood Depressed;Anxious  ?Aggressive Behavior  ?Effect No apparent injury  ?Thought Process  ?Coherency WDL  ?Content Preoccupation  ?Delusions None reported or observed  ?Perception WDL  ?Hallucination None reported or observed  ?Judgment Poor  ?Confusion None  ?Danger to Self  ?Current suicidal ideation? Denies  ?Danger to Others  ?Danger to Others None reported or observed  ? ? ?

## 2021-11-16 NOTE — Progress Notes (Signed)
?   11/16/21 1316  ?Psych Admission Type (Psych Patients Only)  ?Admission Status Voluntary  ?Psychosocial Assessment  ?Patient Complaints Anxiety;Depression;Irritability  ?Eye Contact Fair  ?Facial Expression Anxious  ?Affect Anxious  ?Speech Logical/coherent  ?Interaction Minimal  ?Motor Activity Other (Comment) ?(WNL)  ?Appearance/Hygiene Unremarkable  ?Behavior Characteristics Anxious  ?Mood Depressed;Anxious;Labile  ?Thought Process  ?Coherency WDL  ?Content Preoccupation  ?Delusions None reported or observed  ?Perception WDL  ?Hallucination None reported or observed  ?Judgment Poor  ?Confusion None  ?Danger to Self  ?Current suicidal ideation? Denies  ?Danger to Others  ?Danger to Others None reported or observed  ? ? ?
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
## 2021-11-17 DIAGNOSIS — F314 Bipolar disorder, current episode depressed, severe, without psychotic features: Secondary | ICD-10-CM | POA: Diagnosis not present

## 2021-11-17 LAB — LIPID PANEL
Cholesterol: 171 mg/dL (ref 0–200)
HDL: 58 mg/dL (ref >40)
LDL Cholesterol: 79 mg/dL (ref 0–99)
Total CHOL/HDL Ratio: 2.9 RATIO
Triglycerides: 172 mg/dL — ABNORMAL HIGH (ref <150)
VLDL: 34 mg/dL (ref 0–40)

## 2021-11-17 LAB — TSH: TSH: 2.71 u[IU]/mL (ref 0.350–4.500)

## 2021-11-17 MED ORDER — HALOPERIDOL 5 MG PO TABS
5.0000 mg | ORAL_TABLET | Freq: Every day | ORAL | Status: DC
  Administered 2021-11-17 – 2021-11-18 (×2): 5 mg via ORAL
  Filled 2021-11-17 (×3): qty 1

## 2021-11-17 NOTE — Group Note (Signed)
Recreation Therapy Group Note ? ? ?Group Topic:Healthy Decision Making  ?Group Date: 11/17/2021 ?Start Time: 1002 ?End Time: [id number] ?Facilitators: Victorino Sparrow, LRT,CTRS ?Location: [facility] ? ? ?Goal Area(s) Addresses:  ?Patient will effectively work with peer towards shared goal.  ?Patient will identify factors that guided their decision making.  ?Patient will pro-socially communicate ideas during group session.  ? ?Group Description: Patients were given a scenario that they were going to be stranded on a deserted island for several months before being rescued. Writer tasked them with making a list of 15 things they would choose to bring with them for "survival". The list of items was prioritized most important to least. Each patient would come up with their own list, then work together to create a new list of 15 items while in a group of 3-5 peers. LRT discussed each person's list and how it differed from others. The debrief included discussion of priorities, good decisions versus bad decisions, and how it is important to think before acting so we can make the best decision possible. LRT tied the concept of effective communication among group members to patient's support systems outside of the hospital and its benefit post discharge. ? ? ?Affect/Mood: Flat ?  ?Participation Level: Moderate ?  ?Participation Quality: Independent ?  ?Behavior: Appropriate ?  ?Speech/Thought Process: Focused ?  ?Insight: Fair ?  ?Judgement: Fair  ?  ?Modes of Intervention: Decision Making ?  ?Patient Response to Interventions:  Attentive ?  ?Education Outcome: ? Acknowledges education and In group clarification offered   ? ?Clinical Observations/Individualized Feedback: Pt came in a little late to group.  Pt was brought up to speed and was able to create a list for the scenario provided.  Pt didn't follow the instructions of what the list should entail.  Pt list consisted of more self-esteem efforts such as confidence,  good/supportive people, see/feel belief in support from those people, trust in those people and trust in self.  Pt was attentive as group was coming up with a collective list.  Pt left early and did not return.    ? ? ?Plan: Continue to engage patient in RT group sessions 2-3x/week. ? ? ?Victorino Sparrow, LRT,CTRS ?11/17/2021 12:36 PM ?

## 2021-11-17 NOTE — Progress Notes (Signed)
?   11/17/21 2200  ?Psych Admission Type (Psych Patients Only)  ?Admission Status Voluntary  ?Psychosocial Assessment  ?Patient Complaints Anxiety  ?Eye Contact Fair  ?Facial Expression Sad  ?Affect Blunted  ?Speech Logical/coherent  ?Interaction Cautious  ?Motor Activity Slow  ?Appearance/Hygiene Unremarkable  ?Behavior Characteristics Anxious  ?Mood Anxious;Depressed  ?Aggressive Behavior  ?Effect No apparent injury  ?Thought Process  ?Coherency WDL  ?Content Preoccupation  ?Delusions None reported or observed  ?Perception WDL  ?Hallucination None reported or observed  ?Judgment Poor  ?Confusion None  ?Danger to Self  ?Current suicidal ideation? Denies  ?Danger to Others  ?Danger to Others None reported or observed  ? ? ?

## 2021-11-17 NOTE — Progress Notes (Signed)
The focus of this group is to help patients review their daily goal of treatment and discuss progress on daily workbooks. ? ?Pt attended the evening group and responded to all discussion prompts from the Writer. Pt shared that today was an "okay" day on the unit, the highlight of which was getting to talk with her young daughter on the phone. ? ?On the topic of staying well upon discharge, Lindsey Prince shared that she planned to further her education as a means of improving her life. Pt also discussed finding a better group of friends to be around. ? ?Pt rated her day a 5 out of 10 and her affect was appropriate. ? ? ?

## 2021-11-17 NOTE — Group Note (Signed)
LCSW Group Therapy Note ? ? ?Group Date: 11/17/2021 ?Start Time: 1300 ?End Time: 1400 ? ? ?Type of Therapy and Topic:  Group Therapy: Challenging Core Beliefs ? ?Participation Level:  Did Not Attend ? ?Description of Group:  ?Patients were educated about core beliefs and asked to identify one harmful core belief that they have. Patients were asked to explore from where those beliefs originate. Patients were asked to discuss how those beliefs make them feel and the resulting behaviors of those beliefs. They were then be asked if those beliefs are true and, if so, what evidence they have to support them. Lastly, group members were challenged to replace those negative core beliefs with helpful beliefs.  ? ?Therapeutic Goals:  ? ?1. Patient will identify harmful core beliefs and explore the origins of such beliefs. ?2. Patient will identify feelings and behaviors that result from those core beliefs. ?3. Patient will discuss whether such beliefs are true. ?4.  Patient will replace harmful core beliefs with helpful ones. ? ?Summary of Patient Progress:  Patient was invited by CSW to attend. Patient chose not to attend.  ? ?Therapeutic Modalities: Cognitive Behavioral Therapy; Solution-Focused Therapy ? ? ?Otelia Santee, LCSW ?11/17/2021  2:08 PM   ?

## 2021-11-17 NOTE — Progress Notes (Signed)
Pt A & O to self, place and situation. Denies SI, HI, AVH and pain when assessed. Reports trouble staying asleep last night "I was able to go to sleep but I kept waking up every time. I will get relaxed, sleep then wake right back up".  Rates her depression 1/10 and anxiety 7/10 with current stressor being "My family, no job, no money". Approached Clinical research associate prior to dinner post phone call with her child's father and was tearful, very anxious requesting for her PRN Vistaril. Per pt "If he and his mom knows knows so much then he should take my name off the bills then. I can't do that anymore". Agreed to stay on unit for dinner due to being upset at the time. Pt received PRN Vistaril twice this shift (see EMAR) for anxiety and reported relief when reassessed. Observed in dayroom for groups earlier this shift. Remains medication compliant. Denies adverse drug reactions. Emotional support and encouragement provided to pt. Safety checks continues at Q 15 minutes intervals without self harm gestures or outburst to note thus far. Verbal education provided on all medications and effects monitored. Encouraged pt to voice concerns. Pt remains safe on unit. Mood remains labile with intermittent tearfulness and some irritability. However, pt is verbally redirectable and cooperative with care.  ?

## 2021-11-17 NOTE — Progress Notes (Addendum)
[facility] MD Progress Note ? ?11/17/2021 8:07 PM ?Lindsey Prince  ?MRN:  [id number] ? ?Subjective:  Patient is a 36 year old female with past psychiatric history of bipolar 1 disorder, PTSD and history of psychosis who initially presented unaccompanied to [facility], ED with complaints of anxiety, auditory hallucinations, paranoia and mood lability.  ? ?Chart review from last 24 hours-The patient's chart was reviewed and nursing notes were reviewed. Patient discussed in progression rounds with treatment team. MAR was reviewed and Pt was complaint  with scheduled medications except propanolol which was not given due to low blood pressure.  She required PRN medications: Vistaril x2 yesterday. ? ?Pt was seen today on rounds. She states that she was in groups this morning and people were not taking it seriously so she decided to leave the group.  She reports that she was not able to sleep last night.  She states that she does not want to take trazodone as she is afraid that it will make her sleep harder.  Discussed that we are tapering Tegretol and started on Depakote and will get Depakote level, CBC and CMP in couple of days.  Discussed if she will follow-up at [facility] she may be able to get monthly injection.  She reports that she is interested in [place] but worried that if she is on antipsychotics, people will think that she is psychotic and will not feel safe around her.  Discussed different options for disposition including DV shelter, homeless shelter, [facility] and sober living.  She states she is not using any drugs so cannot go there.  She again gets irritable and starts talking loudly " I don't think you are listening me.  You do not understand, I only behave this way when somebody does not understand me".  She states she only knows about [place] and wants to stay in [place].  Encouraged to call shelters.  She is still paranoid and states "there are people who keeps tabs on me". She believes family is sending  people onto the unit to spy on her.  She states that she feels like getting controlled by someone and being molded into something which she is not.  When asked if she feels like somebody has planted tracking chip in her body, she reports that she felt like that in the past but denies feeling that now. She denies thought insertion, deletion, and thought broadcasting. She admits to belief that she is getting messages from the TV telling her to "calm down." Currently, Pt denies any suicidal ideation, homicidal ideation and, visual and auditory hallucination.  Pt denies any headache, nausea, vomiting, dizziness, chest pain, SOB, abdominal pain, diarrhea, and constipation. Pt denies any medication side effects and has been tolerating it well. Pt denies any concerns.    ? ?Principal Problem: Severe bipolar I disorder, current or most recent episode depressed ([facility]) ?Diagnosis: Principal Problem: ?  Severe bipolar I disorder, current or most recent episode depressed ([facility]) ?Active Problems: ?  PTSD (post-traumatic stress disorder) ?  Bipolar I, most recent episode mixed, severe with psychotic behavior ([facility]) ?  Bipolar 1 disorder ([facility]) ? ?Total Time spent with patient: 30 minutes ?I personally spent 30 minutes on the unit in direct patient care. The direct patient care time included face-to-face time with the patient, reviewing the patient's chart, communicating with other professionals, and coordinating care. Greater than 50% of this time was spent in counseling or coordinating care with the patient regarding goals of hospitalization, psycho-education, and discharge planning  needs.  ? ?Past Psychiatric History: see H&P ? ?Past Medical History:  ?Past Medical History:  ?Diagnosis Date  ? Anemia   ? Herpes   ?  ?Past Surgical History:  ?Procedure Laterality Date  ? NO PAST SURGERIES    ? ?Family History:  ?Family History  ?Problem Relation Age of Onset  ? Healthy Neg Hx   ? ?Family Psychiatric  History: see H&P ? ?Social  History:  ?Social History  ? ?Substance and Sexual Activity  ?Alcohol Use No  ?   ?Social History  ? ?Substance and Sexual Activity  ?Drug Use Yes  ? Types: Marijuana  ?  ?Social History  ? ?Socioeconomic History  ? Marital status: Single  ?  Spouse name: Not on file  ? Number of children: Not on file  ? Years of education: Not on file  ? Highest education level: Not on file  ?Occupational History  ? Not on file  ?Tobacco Use  ? Smoking status: Every Day  ?  Packs/day: 1.50  ?  Types: Cigarettes  ? Smokeless tobacco: Never  ?Substance and Sexual Activity  ? Alcohol use: No  ? Drug use: Yes  ?  Types: Marijuana  ? Sexual activity: Not Currently  ?Other Topics Concern  ? Not on file  ?Social History Narrative  ? Not on file  ? ?Social Determinants of Health  ? ?Financial Resource Strain: Not on file  ?Food Insecurity: Not on file  ?Transportation Needs: Not on file  ?Physical Activity: Not on file  ?Stress: Not on file  ?Social Connections: Not on file  ? ?Sleep: Fair ? ?Appetite:  Good ? ?Current Medications: ?Current Facility-Administered Medications  ?Medication Dose Route Frequency Provider Last Rate Last Admin  ? acetaminophen (TYLENOL) tablet 650 mg  650 mg Oral Q6H PRN Rankin, [name], NP   650 mg at 11/12/21 2048  ? alum & mag hydroxide-simeth (MAALOX/MYLANTA) 200-200-20 MG/5ML suspension 30 mL  30 mL Oral Q4H PRN Rankin, [name], NP   30 mL at 11/14/21 1429  ? benztropine (COGENTIN) tablet 0.5 mg  0.5 mg Oral BID PRN Rankin, [name], NP      ? carbamazepine (TEGRETOL) chewable tablet 100 mg  100 mg Oral BID Karsten Rooda, Vandana, MD   100 mg at 11/17/21 1648  ? divalproex (DEPAKOTE ER) 24 hr tablet 750 mg  750 mg Oral QHS Karsten Rooda, Vandana, MD   750 mg at 11/16/21 2052  ? haloperidol (HALDOL) tablet 5 mg  5 mg Oral QHS Karsten Rooda, Vandana, MD      ? hydrOXYzine (ATARAX) tablet 50 mg  50 mg Oral Q6H PRN Roselle LocusHill, Stephanie Leigh, MD   50 mg at 11/17/21 1648  ? magnesium hydroxide (MILK OF MAGNESIA) suspension 30 mL  30 mL Oral  Daily PRN Rankin, [name], NP      ? melatonin tablet 5 mg  5 mg Oral QHS Karsten Rooda, Vandana, MD   5 mg at 11/16/21 2053  ? OLANZapine (ZYPREXA) tablet 15 mg  15 mg Oral QHS Hill, Shelbie HutchingStephanie Leigh, MD   15 mg at 11/16/21 2053  ? propranolol (INDERAL) tablet 10 mg  10 mg Oral TID Rankin, [name], NP   10 mg at 11/17/21 1648  ? ? ?Lab Results:  ?Results for orders placed or performed during the hospital encounter of 11/08/21 (from the past 48 hour(s))  ?Urinalysis, Complete w Microscopic Urine, Clean Catch     Status: Abnormal  ? Collection Time: 11/16/21  6:22 PM  ?  Result Value Ref Range  ? Color, Urine COLORLESS (A) YELLOW  ? APPearance HAZY (A) CLEAR  ? Specific Gravity, Urine 1.005 1.005 - 1.030  ? pH 8.0 5.0 - 8.0  ? Glucose, UA NEGATIVE NEGATIVE mg/dL  ? Hgb urine dipstick NEGATIVE NEGATIVE  ? Bilirubin Urine NEGATIVE NEGATIVE  ? Ketones, ur NEGATIVE NEGATIVE mg/dL  ? Protein, ur NEGATIVE NEGATIVE mg/dL  ? Nitrite NEGATIVE NEGATIVE  ? Leukocytes,Ua NEGATIVE NEGATIVE  ? RBC / HPF 0-5 0 - 5 RBC/hpf  ? WBC, UA 0-5 0 - 5 WBC/hpf  ? Bacteria, UA RARE (A) NONE SEEN  ? Squamous Epithelial / LPF 0-5 0 - 5  ?  Comment: Performed at [facility], [address] [place], [place] [address]  ?TSH     Status: None  ? Collection Time: 11/17/21  6:15 AM  ?Result Value Ref Range  ? TSH 2.710 0.350 - 4.500 uIU/mL  ?  Comment: Performed by a 3rd Generation assay with a functional sensitivity of <=0.01 uIU/mL. ?Performed at [facility], [address], [place], [place] [address] ?  ?Lipid panel     Status: Abnormal  ? Collection Time: 11/17/21  6:15 AM  ?Result Value Ref Range  ? Cholesterol 171 0 - 200 mg/dL  ? Triglycerides 172 (H) <150 mg/dL  ? HDL 58 >40 mg/dL  ? Total CHOL/HDL Ratio 2.9 RATIO  ? VLDL 34 0 - 40 mg/dL  ? LDL Cholesterol 79 0 - 99 mg/dL  ?  Comment:        ?Total Cholesterol/HDL:CHD Risk ?Coronary Heart Disease Risk Table ?                    Men   Women ? 1/2 Average Risk    3.4   3.3 ? Average Risk       5.0   4.4 ? 2 X Average Risk   9.6   7.1 ? 3 X Average Risk  23.4   11.0 ?       ?Use the calculated Patient Ratio ?above and the CHD Risk Table ?to determine the patient's CHD Ris

## 2021-11-18 DIAGNOSIS — F314 Bipolar disorder, current episode depressed, severe, without psychotic features: Secondary | ICD-10-CM | POA: Diagnosis not present

## 2021-11-18 LAB — HEMOGLOBIN A1C
Hgb A1c MFr Bld: 5.2 % (ref 4.8–5.6)
Mean Plasma Glucose: 103 mg/dL

## 2021-11-18 MED ORDER — SERTRALINE HCL 25 MG PO TABS
25.0000 mg | ORAL_TABLET | Freq: Every day | ORAL | Status: DC
  Administered 2021-11-18 – 2021-11-25 (×8): 25 mg via ORAL
  Filled 2021-11-18 (×8): qty 1
  Filled 2021-11-18 (×2): qty 7

## 2021-11-18 NOTE — Progress Notes (Signed)
?   11/18/21 0500  ?Sleep  ?Number of Hours 8 ?(from 1900)  ? ? ?

## 2021-11-18 NOTE — Progress Notes (Addendum)
[facility] MD Progress Note ? ?11/18/2021 6:27 PM ?Lindsey Prince  ?MRN:  [id number] ? ?Subjective:  Patient is a 36 year old female with past psychiatric history of bipolar 1 disorder, PTSD and history of psychosis who initially presented unaccompanied to [facility], ED with complaints of anxiety, auditory hallucinations, paranoia and mood lability.  ? ?Chart review from last 24 hours-The patient's chart was reviewed and nursing notes were reviewed. Patient discussed in progression rounds with treatment team. MAR was reviewed and Pt was complaint  with scheduled medications except propanolol which was not given due to low blood pressure.  She required PRN medications: Vistaril x2 yesterday. ? ?Pt was seen today on rounds.  Patient states she is writing her short-term goals and keeping herself busy with journaling and writing about herself.  Discussed that we added Haldol because she already tried risperidone in the past which did not work making potential transition to [place] or back to [facility] of little potential benefit.  She reports that her mood still goes up and down and she feels confused specially during the morning time and then depression and anxiety starts kicking in.  She reports that she has been crying less and feels odd that she used to be able to cry to get her emotions out.  She feels guilty that she is not crying.  She reports that she called her ex to talk to her daughter.  She states he also has anger issues.  She feels weird that she has to talk to him in order to talk to her daughter and she is not medically equipped to be able to talk to him or go back to him.  She feels that he is going to be back because he wants to have a mental control on her.  She still feels paranoid and thinks that he is sending people to look for her and he knows where she is.  She states yesterday when she called him, he did not even ask where she is which makes her suspicious that he is monitoring her. She states it  seems like he already knew where she was.  She states she does not have a proof that he is sending people to look for her.  Encouraged her to do reality testing and challenge her paranoid thoughts.  She also reports that another guy she was seeing in the past was also manipulative.  She reports that she does not trust anybody and she was afraid of the writer in the beginning because writer reminded her one of her friend in the past.  Discussed that we will get Depakote level, CBC and CMP in couple of days.   She denies thought insertion, deletion, and thought broadcasting and ideas of reference. Currently, Pt denies any suicidal ideation, homicidal ideation and, visual and auditory hallucination.  Pt denies any headache, nausea, vomiting, dizziness, chest pain, SOB, abdominal pain, diarrhea, and constipation. Pt denies any medication side effects and has been tolerating it well. Pt denies any concerns.    ? ?Principal Problem: Severe bipolar I disorder, current or most recent episode depressed (HCC) ?Diagnosis: Principal Problem: ?  Severe bipolar I disorder, current or most recent episode depressed (HCC) ?Active Problems: ?  PTSD (post-traumatic stress disorder) ?  Bipolar I, most recent episode mixed, severe with psychotic behavior (HCC) ?  Bipolar 1 disorder (HCC) ? ?Total Time spent with patient: 30 minutes ?I personally spent 30 minutes on the unit in direct patient care. The direct patient care time  included face-to-face time with the patient, reviewing the patient's chart, communicating with other professionals, and coordinating care. Greater than 50% of this time was spent in counseling or coordinating care with the patient regarding goals of hospitalization, psycho-education, and discharge planning needs.  ? ?Past Psychiatric History: see H&P ? ?Past Medical History:  ?Past Medical History:  ?Diagnosis Date  ? Anemia   ? Herpes   ?  ?Past Surgical History:  ?Procedure Laterality Date  ? NO PAST SURGERIES     ? ?Family History:  ?Family History  ?Problem Relation Age of Onset  ? Healthy Neg Hx   ? ?Family Psychiatric  History: see H&P ? ?Social History:  ?Social History  ? ?Substance and Sexual Activity  ?Alcohol Use No  ?   ?Social History  ? ?Substance and Sexual Activity  ?Drug Use Yes  ? Types: Marijuana  ?  ?Social History  ? ?Socioeconomic History  ? Marital status: Single  ?  Spouse name: Not on file  ? Number of children: Not on file  ? Years of education: Not on file  ? Highest education level: Not on file  ?Occupational History  ? Not on file  ?Tobacco Use  ? Smoking status: Every Day  ?  Packs/day: 1.50  ?  Types: Cigarettes  ? Smokeless tobacco: Never  ?Substance and Sexual Activity  ? Alcohol use: No  ? Drug use: Yes  ?  Types: Marijuana  ? Sexual activity: Not Currently  ?Other Topics Concern  ? Not on file  ?Social History Narrative  ? Not on file  ? ?Social Determinants of Health  ? ?Financial Resource Strain: Not on file  ?Food Insecurity: Not on file  ?Transportation Needs: Not on file  ?Physical Activity: Not on file  ?Stress: Not on file  ?Social Connections: Not on file  ? ?Sleep: Fair ? ?Appetite:  Good ? ?Current Medications: ?Current Facility-Administered Medications  ?Medication Dose Route Frequency Provider Last Rate Last Admin  ? acetaminophen (TYLENOL) tablet 650 mg  650 mg Oral Q6H PRN Rankin, [name], NP   650 mg at 11/12/21 2048  ? alum & mag hydroxide-simeth (MAALOX/MYLANTA) 200-200-20 MG/5ML suspension 30 mL  30 mL Oral Q4H PRN Rankin, [name], NP   30 mL at 11/18/21 1154  ? benztropine (COGENTIN) tablet 0.5 mg  0.5 mg Oral BID PRN Rankin, [name], NP      ? carbamazepine (TEGRETOL) chewable tablet 100 mg  100 mg Oral BID Karsten Rooda, Vandana, MD   100 mg at 11/18/21 1711  ? divalproex (DEPAKOTE ER) 24 hr tablet 750 mg  750 mg Oral QHS Karsten Rooda, Vandana, MD   750 mg at 11/17/21 2056  ? haloperidol (HALDOL) tablet 5 mg  5 mg Oral QHS Karsten Rooda, Vandana, MD   5 mg at 11/17/21 2056  ? hydrOXYzine  (ATARAX) tablet 50 mg  50 mg Oral Q6H PRN Roselle LocusHill, Stephanie Leigh, MD   50 mg at 11/18/21 16100828  ? magnesium hydroxide (MILK OF MAGNESIA) suspension 30 mL  30 mL Oral Daily PRN Rankin, [name], NP      ? melatonin tablet 5 mg  5 mg Oral QHS Karsten Rooda, Vandana, MD   5 mg at 11/17/21 2056  ? OLANZapine (ZYPREXA) tablet 15 mg  15 mg Oral QHS Hill, Shelbie HutchingStephanie Leigh, MD   15 mg at 11/17/21 2055  ? propranolol (INDERAL) tablet 10 mg  10 mg Oral TID Rankin, [name], NP   10 mg at 11/18/21 1711  ?  sertraline (ZOLOFT) tablet 25 mg  25 mg Oral Daily Karsten Ro, MD   25 mg at 11/18/21 1314  ? ? ?Lab Results:  ?Results for orders placed or performed during the hospital encounter of 11/08/21 (from the past 48 hour(s))  ?TSH     Status: None  ? Collection Time: 11/17/21  6:15 AM  ?Result Value Ref Range  ? TSH 2.710 0.350 - 4.500 uIU/mL  ?  Comment: Performed by a 3rd Generation assay with a functional sensitivity of <=0.01 uIU/mL. ?Performed at [facility], [address] [place], [place] [address] ?  ?Lipid panel     Status: Abnormal  ? Collection Time: 11/17/21  6:15 AM  ?Result Value Ref Range  ? Cholesterol 171 0 - 200 mg/dL  ? Triglycerides 172 (H) <150 mg/dL  ? HDL 58 >40 mg/dL  ? Total CHOL/HDL Ratio 2.9 RATIO  ? VLDL 34 0 - 40 mg/dL  ? LDL Cholesterol 79 0 - 99 mg/dL  ?  Comment:        ?Total Cholesterol/HDL:CHD Risk ?Coronary Heart Disease Risk Table ?                    Men   Women ? 1/2 Average Risk   3.4   3.3 ? Average Risk       5.0   4.4 ? 2 X Average Risk   9.6   7.1 ? 3 X Average Risk  23.4   11.0 ?       ?Use the calculated Patient Ratio ?above and the CHD Risk Table ?to determine the patient's CHD Risk. ?       ?ATP III CLASSIFICATION (LDL): ? <100     mg/dL   Optimal ? 774-128  mg/dL   Near or Above ?                   Optimal ? 130-159  mg/dL   Borderline ? 160-189  mg/dL   High ? >786     mg/dL   Very High ?Performed at [facility], [address] [place], [place]  [address] ?  ?Hemoglobin A1c     Status: None  ? Collection Time: 11/17/21  6:15 AM  ?Result Value Ref Range  ? Hgb A1c MFr Bld 5.2 4.8 - 5.6 %  ?  Comment: (NOTE) ?        Prediabetes: 5.7 - 6.4 ?        Diabetes

## 2021-11-18 NOTE — Group Note (Signed)
Recreation Therapy Group Note ? ? ?Group Topic:Team Building  ?Group Date: 11/18/2021 ?Start Time: 1000 ?End Time: 1025 ?Facilitators: Bjorn Loser, NT ?Location: [address] ? ? ?Goal Area(s) Addresses:  ?Patient will effectively work with peer towards shared goal.  ?Patient will identify skills used to make activity successful.  ?Patient will identify how skills used during activity can be applied to reach post d/c goals.  ?  ?Group Description: Tallest [employer]. In teams of 5-6, patients were given 25 small craft pipe cleaners. Using the materials provided, patients were instructed to compete against the opposing team(s) to build the tallest free-standing structure from floor level. The activity was timed; difficulty increased by Clinical research associate as Production designer, theatre/television/film continued.  Systematically resources were removed with additional directions for example, placing one arm behind their back, working in silence, and shape stipulations. LRT facilitated post-activity discussion reviewing team processes and necessary communication skills involved in completion. Patients were encouraged to reflect how the skills utilized, or not utilized, in this activity can be incorporated to positively impact support systems post discharge. ? ? ?Affect/Mood: Flat ?  ?Participation Level: None ?  ?Participation Quality: None ?  ?Behavior: Appropriate ?  ?Speech/Thought Process: Focused ?  ?Insight: Moderate ?  ?Judgement: Moderate ?  ?Modes of Intervention: Team-building ?  ?Patient Response to Interventions:  Attentive ?  ?Education Outcome: ? Acknowledges education and In group clarification offered   ? ?Clinical Observations/Individualized Feedback: Pt came in late to group.  Pt asked about the activity and watched for a few minutes before leaving and going back to room.  ?  ? ?Plan: Continue to engage patient in RT group sessions 2-3x/week. ? ? ?Caroll Rancher, LRT,CTRS ?11/18/2021 11:36 AM ?

## 2021-11-18 NOTE — Group Note (Signed)
Date:  11/18/2021 ?Time:  9:29 AM ? ?Group Topic/Focus:  ?Goals Group:   The focus of this group is to help patients establish daily goals to achieve during treatment and discuss how the patient can incorporate goal setting into their daily lives to aide in recovery. ?Self Care:   The focus of this group is to help patients understand the importance of self-care in order to improve or restore emotional, physical, spiritual, interpersonal, and financial health. ? ? ? ?Participation Level:  Active ? ?Participation Quality:  Appropriate ? ?Affect:  Appropriate ? ?Cognitive:  Appropriate ? ?Insight: Appropriate and Good ? ?Engagement in Group:  Engaged ? ?Modes of Intervention:  Discussion ? ?Additional Comments:  Pt has a goal of not letting others opinions effect her as much as it does now. Pt also says she would like to relax more and think less.  ? ?Lindsey Prince ?11/18/2021, 9:29 AM ? ?

## 2021-11-18 NOTE — Progress Notes (Signed)
Pt labile on the unit, but mostly pleasant. Pt frustrated that she has nowhere to go and continue to feel she has no one to help her in her life. 1:1 time spent talking about D/C planning  ? ? ? 11/18/21 2100  ?Psych Admission Type (Psych Patients Only)  ?Admission Status Voluntary  ?Psychosocial Assessment  ?Patient Complaints Apathy  ?Eye Contact Fair  ?Facial Expression Sad  ?Affect Blunted  ?Speech Logical/coherent  ?Interaction Cautious  ?Motor Activity Slow  ?Appearance/Hygiene Unremarkable  ?Behavior Characteristics Anxious  ?Mood Anxious;Labile;Pleasant  ?Aggressive Behavior  ?Effect No apparent injury  ?Thought Process  ?Coherency Circumstantial  ?Content Blaming others  ?Delusions None reported or observed  ?Perception WDL  ?Hallucination None reported or observed  ?Judgment Poor  ?Confusion None  ?Danger to Self  ?Current suicidal ideation? Denies  ?Danger to Others  ?Danger to Others None reported or observed  ? ? ?

## 2021-11-19 ENCOUNTER — Encounter (HOSPITAL_COMMUNITY): Payer: Self-pay

## 2021-11-19 DIAGNOSIS — F314 Bipolar disorder, current episode depressed, severe, without psychotic features: Secondary | ICD-10-CM | POA: Diagnosis not present

## 2021-11-19 MED ORDER — IBUPROFEN 400 MG PO TABS: 400.0000 mg | ORAL_TABLET | Freq: Four times a day (QID) | ORAL | Status: DC | PRN

## 2021-11-19 MED ORDER — HALOPERIDOL 5 MG PO TABS
5.0000 mg | ORAL_TABLET | Freq: Two times a day (BID) | ORAL | Status: DC
Start: 2021-11-19 — End: 2021-11-21
  Administered 2021-11-19 – 2021-11-21 (×4): 5 mg via ORAL
  Filled 2021-11-19 (×8): qty 1

## 2021-11-19 NOTE — Group Note (Signed)
Recreation Therapy Group Note ? ? ?Group Topic:Health and Wellness  ?Group Date: 11/19/2021 ?Start Time: 1000 ?End Time: 1020 ?Facilitators: Victorino Sparrow, LRT,CTRS ?Location: [place] ? ? ?Goal Area(s) Addresses:  ?Patient will verbalize benefit of exercise during group session. ?Patient will acknowledge benefits of exercise when used as a coping mechanism.  ?  ?Group Description:  Exercise.  LRT led patients in a series of stretches to loosen up the muscles before going into the actual exercises.  Each patient would take turns leading the group in exercises of their choosing.  The goal of the group is to complete at least 30 minutes of exercise.  Patients were encouraged to get water and take breaks as needed. ? ? ?Affect/Mood: N/A ?  ?Participation Level: Did not attend ?  ? ?Clinical Observations/Individualized Feedback:   ? ? ?Plan: Continue to engage patient in RT group sessions 2-3x/week. ? ? ?Victorino Sparrow, LRT,CTRS ?11/19/2021 11:22 AM ?

## 2021-11-19 NOTE — Group Note (Signed)
[facility] LCSW Group Therapy Note ? ?Date/Time:  11/19/2021 11:00 am ? ?Type of Therapy and Topic:  Group Therapy:  Healthy and Unhealthy Supports ? ?Participation Level:  Did Not Attend  ? ?Description of Group:  Patients in this group were introduced to the idea of adding a variety of healthy supports to address the various needs in their lives, especially in reference to their plans and focus for the new year.  Patients discussed what additional healthy supports could be helpful in their recovery and wellness after discharge in order to prevent future hospitalizations.   An emphasis was placed on using counselor, doctor, therapy groups, 12-step groups, and problem-specific support groups to expand supports.   ? ?Therapeutic Goals: ? ? 1)  discuss importance of adding supports to stay well once out of the hospital ? 2)  compare healthy versus unhealthy supports and identify some examples of each ? 3)  generate ideas and descriptions of healthy supports that can be added ? 4)  offer mutual support about how to address unhealthy supports ? 5)  encourage active participation in and adherence to discharge plan ?  ? ?Summary of Patient Progress:  Patient was given worksheet and offered opportunity to discuss one on one with CSW.  ? ? ?Therapeutic Modalities:   ?Motivational Interviewing ?Brief Solution-Focused Therapy ? ? ?Yonna Alwin MSW, LCSW ?Clincal Social Worker  ?[facility]  ?

## 2021-11-19 NOTE — Progress Notes (Signed)
?   11/19/21 2100  ?Psych Admission Type (Psych Patients Only)  ?Admission Status Voluntary  ?Psychosocial Assessment  ?Patient Complaints Worrying  ?Eye Contact Fair  ?Facial Expression Sad  ?Affect Flat  ?Speech Logical/coherent  ?Interaction Cautious  ?Motor Activity Slow  ?Appearance/Hygiene Unremarkable  ?Behavior Characteristics Anxious  ?Mood Pleasant  ?Aggressive Behavior  ?Effect No apparent injury  ?Thought Process  ?Coherency Circumstantial;Concrete thinking  ?Content WDL  ?Delusions WDL  ?Perception WDL  ?Hallucination None reported or observed  ?Judgment Poor  ?Confusion None  ?Danger to Self  ?Current suicidal ideation? Denies  ?Danger to Others  ?Danger to Others None reported or observed  ? ? ?

## 2021-11-19 NOTE — Plan of Care (Signed)
?  Problem: Activity: ?Goal: Interest or engagement in activities will improve ?Outcome: Progressing ?Goal: Sleeping patterns will improve ?Outcome: Progressing ?  ?Problem: Coping: ?Goal: Ability to verbalize frustrations and anger appropriately will improve ?Outcome: Progressing ?Goal: Ability to demonstrate self-control will improve ?Outcome: Progressing ?  ?Problem: Safety: ?Goal: Periods of time without injury will increase ?Outcome: Progressing ?  ?

## 2021-11-19 NOTE — Progress Notes (Signed)
[facility] MD Progress Note ? ?11/20/2021 7:30 AM ?Lindsey Prince  ?MRN:  [id number] ? ?Chief Complaint: paranoia ? ?Reason for Admission:  ?Lindsey Prince is a 36 y.o. female with past psychiatric history of bipolar 1 disorder, PTSD and history of psychosis, who initially presented unaccompanied to [facility], ED with complaints of anxiety, auditory hallucinations, paranoia and mood lability. The patient is currently on Hospital Day 12.  ? ?Chart Review from last 24 hours:  ?The patient's chart was reviewed and nursing notes were reviewed. The patient's case was discussed in multidisciplinary team meeting.  Per nursing she had no acute behavioral concerns or safety issues noted but remains at an flat on the unit.  She did attend most groups. Per MAR was compliant with scheduled medications and did require Vistaril x1 for anxiety and Tylenol x1 for pain. ? ?Information Obtained Today During Patient Interview: ?The patient was seen and evaluated on the unit. On assessment today the patient reports that she is feeling better today.  She states she slept better overnight and has an improving appetite.  She states she talked to her ex and it went better than expected on the phone.  She feels he is "changing and turning over a new leaf" since going to anger management, and she states that she feels more secure in potentially returning to live with him after discharge.  She now recognizes that he has been trying to help her and she does not feel that he is monitoring her out of "spite or revenge" but instead has been concerned.  She voices no physical complaints and denies medication side effects.  She denies any paranoia with staff or peers on the unit and denies AVH, ideas of reference or first rank symptoms.  She denies SI or HI.  We discussed that she is due for repeat labs tonight since starting Depakote.  She does feel that the medication changes have been helpful. ? ?Principal Problem: Severe bipolar I disorder, current  or most recent episode depressed (HCC) ?Diagnosis: Principal Problem: ?  Severe bipolar I disorder, current or most recent episode depressed (HCC) ?Active Problems: ?  PTSD (post-traumatic stress disorder) ?  Bipolar I, most recent episode mixed, severe with psychotic behavior (HCC) ?  Bipolar 1 disorder (HCC) ? ?Total Time Spent in Direct Patient Care:  ?I personally spent 25 minutes on the unit in direct patient care. The direct patient care time included face-to-face time with the patient, reviewing the patient's chart, communicating with other professionals, and coordinating care. Greater than 50% of this time was spent in counseling or coordinating care with the patient regarding goals of hospitalization, psycho-education, and discharge planning needs. ? ?Past Psychiatric History: see H&P ? ?Past Medical History:  ?Past Medical History:  ?Diagnosis Date  ? Anemia   ? Herpes   ?  ?Past Surgical History:  ?Procedure Laterality Date  ? NO PAST SURGERIES    ? ?Family History:  ?Family History  ?Problem Relation Age of Onset  ? Healthy Neg Hx   ? ?Family Psychiatric  History: see H&P ? ?Social History:  ?Social History  ? ?Substance and Sexual Activity  ?Alcohol Use No  ?   ?Social History  ? ?Substance and Sexual Activity  ?Drug Use Yes  ? Types: Marijuana  ?  ?Social History  ? ?Socioeconomic History  ? Marital status: Single  ?  Spouse name: Not on file  ? Number of children: Not on file  ? Years of education: Not on  file  ? Highest education level: Not on file  ?Occupational History  ? Not on file  ?Tobacco Use  ? Smoking status: Every Day  ?  Packs/day: 1.50  ?  Types: Cigarettes  ? Smokeless tobacco: Never  ?Substance and Sexual Activity  ? Alcohol use: No  ? Drug use: Yes  ?  Types: Marijuana  ? Sexual activity: Not Currently  ?Other Topics Concern  ? Not on file  ?Social History Narrative  ? Not on file  ? ?Social Determinants of Health  ? ?Financial Resource Strain: Not on file  ?Food Insecurity: Not on  file  ?Transportation Needs: Not on file  ?Physical Activity: Not on file  ?Stress: Not on file  ?Social Connections: Not on file  ? ? ?Sleep: Good ? ?Appetite:  Good ? ?Current Medications: ?Current Facility-Administered Medications  ?Medication Dose Route Frequency Provider Last Rate Last Admin  ? acetaminophen (TYLENOL) tablet 650 mg  650 mg Oral Q6H PRN Rankin, [name], NP   650 mg at 11/19/21 2030  ? alum & mag hydroxide-simeth (MAALOX/MYLANTA) 200-200-20 MG/5ML suspension 30 mL  30 mL Oral Q4H PRN Rankin, [name], NP   30 mL at 11/18/21 1154  ? benztropine (COGENTIN) tablet 0.5 mg  0.5 mg Oral BID PRN Rankin, [name], NP      ? divalproex (DEPAKOTE ER) 24 hr tablet 750 mg  750 mg Oral QHS Karsten Ro, MD   750 mg at 11/19/21 2029  ? haloperidol (HALDOL) tablet 5 mg  5 mg Oral BID Karsten Ro, MD   5 mg at 11/19/21 1238  ? hydrOXYzine (ATARAX) tablet 50 mg  50 mg Oral Q6H PRN Roselle Locus, MD   50 mg at 11/19/21 2029  ? ibuprofen (ADVIL) tablet 400 mg  400 mg Oral Q6H PRN [name], [name], NP      ? magnesium hydroxide (MILK OF MAGNESIA) suspension 30 mL  30 mL Oral Daily PRN Rankin, [name], NP      ? melatonin tablet 5 mg  5 mg Oral QHS Karsten Ro, MD   5 mg at 11/19/21 2029  ? OLANZapine (ZYPREXA) tablet 15 mg  15 mg Oral QHS Hill, Shelbie Hutching, MD   15 mg at 11/19/21 2029  ? propranolol (INDERAL) tablet 10 mg  10 mg Oral TID Rankin, [name], NP   10 mg at 11/19/21 1700  ? sertraline (ZOLOFT) tablet 25 mg  25 mg Oral Daily Karsten Ro, MD   25 mg at 11/19/21 0803  ? ? ?Lab Results: No results found for this or any previous visit (from the past 48 hour(s)). ? ?Blood Alcohol level:  ?Lab Results  ?Component Value Date  ? ETH <10 11/06/2021  ? ETH <10 01/24/2021  ? ? ?Metabolic Disorder Labs: ?Lab Results  ?Component Value Date  ? HGBA1C 5.2 11/17/2021  ? MPG 103 11/17/2021  ? MPG 108.28 01/24/2021  ? ?No results found for: PROLACTIN ?Lab Results  ?Component Value Date  ? CHOL 171  11/17/2021  ? TRIG 172 (H) 11/17/2021  ? HDL 58 11/17/2021  ? CHOLHDL 2.9 11/17/2021  ? VLDL 34 11/17/2021  ? LDLCALC 79 11/17/2021  ? LDLCALC 47 01/24/2021  ? ? ?Physical Findings: ?AIMS: Facial and Oral Movements ?Muscles of Facial Expression: None, normal ?Lips and Perioral Area: None, normal ?Jaw: None, normal ?Tongue: None, normal,Extremity Movements ?Upper (arms, wrists, hands, fingers): None, normal ?Lower (legs, knees, ankles, toes): None, normal, Trunk Movements ?Neck, shoulders, hips: None, normal,  Overall Severity ?Severity of abnormal movements (highest score from questions above): None, normal ?Incapacitation due to abnormal movements: None, normal ?Patient's awareness of abnormal movements (rate only patient's report): No Awareness, Dental Status ?Current problems with teeth and/or dentures?: No ?Does patient usually wear dentures?: No  ? ?Musculoskeletal: ?Strength & Muscle Tone: within normal limits ?Gait & Station: normal ?Patient leans: N/A ? ?Psychiatric Specialty Exam: ? ?Presentation  ?General Appearance: Casually dressed, adequate hygiene ? ?Eye Contact:Fair ? ?Speech:Clear and coherent, normal rate ? ?Speech Volume:Normal ? ?Mood and Affect  ?Mood:described as improved - appears more calm and less irritable ? ?Affect:no longer labile, less constricted, brighter appearing ? ? ?Thought Process  ?Thought Processes:linear and more goal directed ? ?Orientation:Full (Time, Place and Person) ? ?Thought Content: She does not make paranoid statements about her ex and denies paranoia regarding staff or peers today.  She denies AVH, ideas of reference or first rank symptoms and does not make overtly delusional statements on exam.  She appears less guarded and is not grossly responding to internal or external stimuli today; she appears less ruminative about psychosocial stressors ? ?Hallucinations:Denied ? ?Ideas of Reference:Denied ? ?Suicidal Thoughts:Denied ? ?Homicidal Thoughts:Denied ? ?Sensorium   ?Memory:Immediate Fair; Recent Fair; Remote Fair ? ?Judgment:Fair ? ?Insight:Improving ? ? ?Executive Functions  ?Concentration:Fair ? ?Attention Span:Fair ? ?Recall:Fair ? ?Fund of Knowledge:Fair ? ?Language:
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
## 2021-11-19 NOTE — BH IP Treatment Plan (Signed)
Interdisciplinary Treatment and Diagnostic Plan Update ? ?11/19/2021 ?Time of Session: 10:05am  ?Lindsey JewRosa M Prince ?MRN: [id number] ? ?Principal Diagnosis: Severe bipolar I disorder, current or most recent episode depressed (HCC) ? ?Secondary Diagnoses: Principal Problem: ?  Severe bipolar I disorder, current or most recent episode depressed (HCC) ?Active Problems: ?  PTSD (post-traumatic stress disorder) ?  Bipolar I, most recent episode mixed, severe with psychotic behavior (HCC) ?  Bipolar 1 disorder (HCC) ? ? ?Current Medications:  ?Current Facility-Administered Medications  ?Medication Dose Route Frequency Provider Last Rate Last Admin  ? acetaminophen (TYLENOL) tablet 650 mg  650 mg Oral Q6H PRN Rankin, [name], NP   650 mg at 11/12/21 2048  ? alum & mag hydroxide-simeth (MAALOX/MYLANTA) 200-200-20 MG/5ML suspension 30 mL  30 mL Oral Q4H PRN Rankin, [name], NP   30 mL at 11/18/21 1154  ? benztropine (COGENTIN) tablet 0.5 mg  0.5 mg Oral BID PRN Rankin, [name], NP      ? carbamazepine (TEGRETOL) chewable tablet 100 mg  100 mg Oral BID Karsten Rooda, Vandana, MD   100 mg at 11/19/21 0803  ? divalproex (DEPAKOTE ER) 24 hr tablet 750 mg  750 mg Oral QHS Karsten Rooda, Vandana, MD   750 mg at 11/18/21 2031  ? haloperidol (HALDOL) tablet 5 mg  5 mg Oral BID Karsten Rooda, Vandana, MD      ? hydrOXYzine (ATARAX) tablet 50 mg  50 mg Oral Q6H PRN Roselle LocusHill, Stephanie Leigh, MD   50 mg at 11/18/21 2032  ? magnesium hydroxide (MILK OF MAGNESIA) suspension 30 mL  30 mL Oral Daily PRN Rankin, [name], NP      ? melatonin tablet 5 mg  5 mg Oral QHS Karsten Rooda, Vandana, MD   5 mg at 11/18/21 2031  ? OLANZapine (ZYPREXA) tablet 15 mg  15 mg Oral QHS Hill, Shelbie HutchingStephanie Leigh, MD   15 mg at 11/18/21 2031  ? propranolol (INDERAL) tablet 10 mg  10 mg Oral TID Rankin, [name], NP   10 mg at 11/19/21 [id number]  ? sertraline (ZOLOFT) tablet 25 mg  25 mg Oral Daily Karsten Rooda, Vandana, MD   25 mg at 11/19/21 0803  ? ?PTA Medications: ?Medications Prior to Admission  ?Medication Sig  Dispense Refill Last Dose  ? benztropine (COGENTIN) 0.5 MG tablet Take 1 tablet (0.5 mg total) by mouth 2 (two) times daily as needed for tremors. (Patient not taking: Reported on 11/06/2021) 45 tablet 0   ? carbamazepine (TEGRETOL) 100 MG chewable tablet Chew 1 tablet (100 mg total) by mouth 2 (two) times daily. (Patient not taking: Reported on 11/06/2021) 60 tablet 0   ? ibuprofen (ADVIL) 200 MG tablet Take 400-800 mg by mouth every 6 (six) hours as needed for moderate pain or headache.     ? loratadine (CLARITIN) 10 MG tablet Take 1 tablet (10 mg total) by mouth daily. 30 tablet 0   ? propranolol (INDERAL) 10 MG tablet Take 1 tablet (10 mg total) by mouth 3 (three) times daily. (Patient not taking: Reported on 11/06/2021) 90 tablet 0   ? risperiDONE (RISPERDAL M-TABS) 2 MG disintegrating tablet Take 1 tablet (2 mg total) by mouth at bedtime. (Patient not taking: Reported on 11/06/2021) 30 tablet 0   ? traZODone (DESYREL) 100 MG tablet Take 1 tablet (100 mg total) by mouth at bedtime as needed for sleep. (Patient not taking: Reported on 11/06/2021) 25 tablet 0   ? ? ?Patient Stressors: Financial difficulties   ?Marital or family conflict   ?  Medication change or noncompliance   ?Occupational concerns   ?Substance abuse   ? ?Patient Strengths: Capable of independent living  ?Communication skills  ?Physical Health  ? ?Treatment Modalities: Medication Management, Group therapy, Case management,  ?1 to 1 session with clinician, Psychoeducation, Recreational therapy. ? ? ?Physician Treatment Plan for Primary Diagnosis: Severe bipolar I disorder, current or most recent episode depressed (HCC) ?Long Term Goal(s): Improvement in symptoms so as ready for discharge  ? ?Short Term Goals: Ability to identify changes in lifestyle to reduce recurrence of condition will improve ?Ability to verbalize feelings will improve ?Ability to disclose and discuss suicidal ideas ?Ability to demonstrate self-control will improve ?Ability to identify  and develop effective coping behaviors will improve ?Ability to maintain clinical measurements within normal limits will improve ?Compliance with prescribed medications will improve ?Ability to identify triggers associated with substance abuse/mental health issues will improve ? ?Medication Management: Evaluate patient's response, side effects, and tolerance of medication regimen. ? ?Therapeutic Interventions: 1 to 1 sessions, Unit Group sessions and Medication administration. ? ?Evaluation of Outcomes: Progressing ? ?Physician Treatment Plan for Secondary Diagnosis: Principal Problem: ?  Severe bipolar I disorder, current or most recent episode depressed (HCC) ?Active Problems: ?  PTSD (post-traumatic stress disorder) ?  Bipolar I, most recent episode mixed, severe with psychotic behavior ([facility]) ?  Bipolar 1 disorder (H[facility]) ? ?Long Term Goal(s): Improvement in symptoms so as ready for discharge  ? ?Short Term Goals: Ability to identify changes in lifestyle to reduce recurrence of condition will improve ?Ability to verbalize feelings will improve ?Ability to disclose and discuss suicidal ideas ?Ability to demonstrate self-control will improve ?Ability to identify and develop effective coping behaviors will improve ?Ability to maintain clinical measurements within normal limits will improve ?Compliance with prescribed medications will improve ?Ability to identify triggers associated with substance abuse/mental health issues will improve    ? ?Medication Management: Evaluate patient's response, side effects, and tolerance of medication regimen. ? ?Therapeutic Interventions: 1 to 1 sessions, Unit Group sessions and Medication administration. ? ?Evaluation of Outcomes: Progressing ? ? ?RN Treatment Plan for Primary Diagnosis: Severe bipolar I disorder, current or most recent episode depressed (HCC) ?Long Term Goal(s): Knowledge of disease and therapeutic regimen to maintain health will improve ? ?Short Term Goals: Ability  to remain free from injury will improve, Ability to participate in decision making will improve, Ability to verbalize feelings will improve, Ability to disclose and discuss suicidal ideas, and Ability to identify and develop effective coping behaviors will improve ? ?Medication Management: RN will administer medications as ordered by provider, will assess and evaluate patient's response and provide education to patient for prescribed medication. RN will report any adverse and/or side effects to prescribing provider. ? ?Therapeutic Interventions: 1 on 1 counseling sessions, Psychoeducation, Medication administration, Evaluate responses to treatment, Monitor vital signs and CBGs as ordered, Perform/monitor CIWA, COWS, AIMS and Fall Risk screenings as ordered, Perform wound care treatments as ordered. ? ?Evaluation of Outcomes: Progressing ? ? ?LCSW Treatment Plan for Primary Diagnosis: Severe bipolar I disorder, current or most recent episode depressed (HCC) ?Long Term Goal(s): Safe transition to appropriate next level of care at discharge, Engage patient in therapeutic group addressing interpersonal concerns. ? ?Short Term Goals: Engage patient in aftercare planning with referrals and resources, Increase social support, Increase emotional regulation, Facilitate acceptance of mental health diagnosis and concerns, Identify triggers associated with mental health/substance abuse issues, and Increase skills for wellness and recovery ? ?Therapeutic Interventions: Assess for all discharge  needs, 1 to 1 time with Child psychotherapist, Explore available resources and support systems, Assess for adequacy in community support network, Educate family and significant other(s) on suicide prevention, Complete Psychosocial Assessment, Interpersonal group therapy. ? ?Evaluation of Outcomes: Progressing ? ? ?Progress in Treatment: ?Attending groups: Yes. ?Participating in groups: Yes. ?Taking medication as prescribed: Yes. ?Toleration  medication: Yes. ?Family/Significant other contact made: No, will contact:  Declined Consents  ?Patient understands diagnosis: No. ?Discussing patient identified problems/goals with staff: Yes. ?Medical pr

## 2021-11-19 NOTE — Progress Notes (Signed)
?   11/19/21 0800  ?Psych Admission Type (Psych Patients Only)  ?Admission Status Voluntary  ?Psychosocial Assessment  ?Patient Complaints None  ?Eye Contact Fair  ?Facial Expression Sad  ?Affect Flat  ?Speech Logical/coherent  ?Interaction Cautious  ?Motor Activity  ?(WNL)  ?Appearance/Hygiene Unremarkable  ?Behavior Characteristics Anxious  ?Mood Pleasant  ?Aggressive Behavior  ?Effect No apparent injury  ?Thought Process  ?Coherency Concrete thinking  ?Content WDL  ?Delusions None reported or observed  ?Perception WDL  ?Hallucination None reported or observed  ?Judgment Poor  ?Confusion None  ?Danger to Self  ?Current suicidal ideation? Denies  ?Danger to Others  ?Danger to Others None reported or observed  ? ?Patient is alert and oriented to person, place, and situation during shift assessment. She is compliant with schedule medication and no side effect noted. Patient denies SI/HI/AVH, flat affect and sad facial expression noted. Patient able to state the reason she was admitted to the [facility] adult unit as "Psychological damage." Patient denies pain or any discomfort. No distress noted at this time. Staff will continue to support and encourage patient in the milieu.  ?

## 2021-11-19 NOTE — [facility] Group Notes (Signed)
Wrap up group: feels like a 7 out of 10 ?Wants to work on staying positive. ?

## 2021-11-19 NOTE — [facility] Group Notes (Signed)
Adult Psychoeducational Group Note ? ?Date:  11/19/2021 ?Time:  9:25 AM ? ?Group Topic/Focus:  ?Goals Group:   The focus of this group is to help patients establish daily goals to achieve during treatment and discuss how the patient can incorporate goal setting into their daily lives to aide in recovery. ? ?Participation Level:  Did Not Attend ? ?[employer] Yandriel Boening ?11/19/2021, 9:25 AM ?

## 2021-11-19 NOTE — [facility] Group Notes (Signed)
?  Spirituality group facilitated by Kathrynn Humble, [place].  ? ?Group Description: Group focused on topic of hope. Patients participated in facilitated discussion around topic, connecting with one another around experiences and definitions for hope. Group members engaged with visual explorer photos, reflecting on what hope looks like for them today. Group engaged in discussion around how their definitions of hope are present today in hospital.  ? ?Modalities: Psycho-social ed, [place]ian, Narrative, MI  ? ?Patient Progress: Lindsey Prince attended group.  Participation was limited, but she did show some engagement.  She left partway through group. ? ?[employer], Bcc ?Pager, [phone] ?

## 2021-11-20 LAB — COMPREHENSIVE METABOLIC PANEL
ALT: 38 U/L (ref 0–44)
AST: 24 U/L (ref 15–41)
Albumin: 3.9 g/dL (ref 3.5–5.0)
Alkaline Phosphatase: 59 U/L (ref 38–126)
Anion gap: 8 (ref 5–15)
BUN: 14 mg/dL (ref 6–20)
CO2: 27 mmol/L (ref 22–32)
Calcium: 9 mg/dL (ref 8.9–10.3)
Chloride: 102 mmol/L (ref 98–111)
Creatinine, Ser: 0.68 mg/dL (ref 0.44–1.00)
GFR, Estimated: >60 mL/min (ref >60)
Glucose, Bld: 106 mg/dL — ABNORMAL HIGH (ref 70–99)
Potassium: 4.2 mmol/L (ref 3.5–5.1)
Sodium: 137 mmol/L (ref 135–145)
Total Bilirubin: 0.2 mg/dL — ABNORMAL LOW (ref 0.3–1.2)
Total Protein: 6.9 g/dL (ref 6.5–8.1)

## 2021-11-20 LAB — CBC WITH DIFFERENTIAL/PLATELET
Abs Immature Granulocytes: 0.03 10*3/uL (ref 0.00–0.07)
Basophils Absolute: 0.1 10*3/uL (ref 0.0–0.1)
Basophils Relative: 1 %
Eosinophils Absolute: 0.3 10*3/uL (ref 0.0–0.5)
Eosinophils Relative: 3 %
HCT: 38.1 % (ref 36.0–46.0)
Hemoglobin: 13 g/dL (ref 12.0–15.0)
Immature Granulocytes: 0 %
Lymphocytes Relative: 42 %
Lymphs Abs: 3.6 10*3/uL (ref 0.7–4.0)
MCH: 32.6 pg (ref 26.0–34.0)
MCHC: 34.1 g/dL (ref 30.0–36.0)
MCV: 95.5 fL (ref 80.0–100.0)
Monocytes Absolute: 0.8 10*3/uL (ref 0.1–1.0)
Monocytes Relative: 9 %
Neutro Abs: 3.9 10*3/uL (ref 1.7–7.7)
Neutrophils Relative %: 45 %
Platelets: 465 10*3/uL — ABNORMAL HIGH (ref 150–400)
RBC: 3.99 MIL/uL (ref 3.87–5.11)
RDW: 12.8 % (ref 11.5–15.5)
WBC: 8.6 10*3/uL (ref 4.0–10.5)
nRBC: 0 % (ref 0.0–0.2)

## 2021-11-20 LAB — VALPROIC ACID LEVEL: Valproic Acid Lvl: 24 ug/mL — ABNORMAL LOW (ref 50.0–100.0)

## 2021-11-20 MED ORDER — WHITE PETROLATUM EX OINT
TOPICAL_OINTMENT | CUTANEOUS | Status: AC
  Filled 2021-11-20: qty 20

## 2021-11-20 NOTE — Progress Notes (Signed)
?   11/20/21 0629  ?Sleep  ?Number of Hours 7  ? ? ?

## 2021-11-20 NOTE — Progress Notes (Signed)
?   11/20/21 2100  ?Psych Admission Type (Psych Patients Only)  ?Admission Status Voluntary  ?Psychosocial Assessment  ?Patient Complaints Anxiety  ?Eye Contact Fair  ?Facial Expression Flat  ?Affect Appropriate to circumstance  ?Speech Logical/coherent  ?Interaction Assertive  ?Motor Activity Slow  ?Appearance/Hygiene Unremarkable  ?Behavior Characteristics Cooperative;Appropriate to situation  ?Mood Pleasant  ?Thought Process  ?Coherency WDL  ?Content WDL  ?Delusions None reported or observed  ?Perception WDL  ?Hallucination None reported or observed  ?Judgment Poor  ?Confusion None  ?Danger to Self  ?Current suicidal ideation? Denies  ?Danger to Others  ?Danger to Others None reported or observed  ? ?Pt complained of swollen legs bilateral with left leg being painful. Pt was given tylenol 650 mg with good relief. Pt denied SI/HI and contracted for safety, will continue to monitor. ?

## 2021-11-20 NOTE — Progress Notes (Signed)
?   11/20/21 1300  ?Psych Admission Type (Psych Patients Only)  ?Admission Status Voluntary  ?Psychosocial Assessment  ?Patient Complaints Worrying  ?Eye Contact Fair  ?Facial Expression Sad  ?Affect Blunted  ?Speech Logical/coherent  ?Interaction Cautious  ?Motor Activity Slow  ?Appearance/Hygiene Unremarkable  ?Behavior Characteristics Cooperative  ?Mood Pleasant  ?Thought Process  ?Coherency Circumstantial  ?Content WDL  ?Delusions WDL  ?Perception WDL  ?Hallucination None reported or observed  ?Judgment Poor  ?Confusion None  ?Danger to Self  ?Current suicidal ideation? Denies  ?Danger to Others  ?Danger to Others None reported or observed  ? ? ?

## 2021-11-20 NOTE — Group Note (Signed)
[facility] Group Notes: (Clinical Social Work)  ? ?11/20/2021    ? ? ?Type of Therapy:  Group Therapy  ? ?Participation Level:  Did Not Attend - was invited both individually by [facility] and by overhead announcement, chose not to attend. ? ? ?Ambrose Mantle, LCSW ?11/20/2021, 11:52 AM ?   ?

## 2021-11-21 MED ORDER — HALOPERIDOL 5 MG PO TABS
7.5000 mg | ORAL_TABLET | Freq: Two times a day (BID) | ORAL | Status: DC
  Administered 2021-11-21 – 2021-11-24 (×6): 7.5 mg via ORAL
  Filled 2021-11-21 (×8): qty 1.5

## 2021-11-21 MED ORDER — DICLOFENAC SODIUM 1 % EX GEL
2.0000 g | Freq: Four times a day (QID) | CUTANEOUS | Status: DC
  Administered 2021-11-21 – 2021-11-25 (×16): 2 g via TOPICAL
  Filled 2021-11-21: qty 100

## 2021-11-21 MED ORDER — DIVALPROEX SODIUM ER 500 MG PO TB24
1000.0000 mg | ORAL_TABLET | Freq: Every day | ORAL | Status: DC
  Administered 2021-11-21 – 2021-11-24 (×4): 1000 mg via ORAL
  Filled 2021-11-21: qty 14
  Filled 2021-11-21: qty 2
  Filled 2021-11-21: qty 14
  Filled 2021-11-21 (×3): qty 2

## 2021-11-21 MED ORDER — OLANZAPINE 10 MG PO TABS
10.0000 mg | ORAL_TABLET | Freq: Every day | ORAL | Status: DC
  Administered 2021-11-21 – 2021-11-22 (×2): 10 mg via ORAL
  Filled 2021-11-21 (×3): qty 1

## 2021-11-21 NOTE — Progress Notes (Signed)
?   11/21/21 2200  ?Psych Admission Type (Psych Patients Only)  ?Admission Status Voluntary  ?Psychosocial Assessment  ?Patient Complaints Anxiety;Depression  ?Eye Contact Fair  ?Facial Expression Anxious  ?Affect Anxious  ?Speech Logical/coherent  ?Interaction Assertive  ?Motor Activity Slow  ?Appearance/Hygiene Unremarkable  ?Behavior Characteristics Cooperative  ?Mood Pleasant  ?Aggressive Behavior  ?Effect No apparent injury  ?Thought Process  ?Coherency WDL;Circumstantial  ?Content WDL;Blaming others  ?Delusions None reported or observed  ?Perception WDL  ?Hallucination None reported or observed  ?Judgment Poor  ?Confusion None  ?Danger to Self  ?Current suicidal ideation? Denies  ?Danger to Others  ?Danger to Others None reported or observed  ? ? ?

## 2021-11-21 NOTE — Progress Notes (Signed)
?   11/21/21 1622  ?Psych Admission Type (Psych Patients Only)  ?Admission Status Voluntary  ?Psychosocial Assessment  ?Patient Complaints Anxiety;Depression  ?Eye Contact Fair  ?Facial Expression Anxious  ?Affect Anxious  ?Speech Logical/coherent  ?Interaction Assertive  ?Motor Activity Slow  ?Appearance/Hygiene Unremarkable  ?Behavior Characteristics Cooperative;Calm  ?Mood Euthymic;Pleasant  ?Thought Process  ?Coherency WDL  ?Content WDL  ?Delusions None reported or observed  ?Perception WDL  ?Hallucination None reported or observed  ?Judgment Poor  ?Confusion None  ?Danger to Self  ?Current suicidal ideation? Denies  ?Danger to Others  ?Danger to Others None reported or observed  ? ? ?

## 2021-11-21 NOTE — Group Note (Signed)
[facility] LCSW Group Therapy Note ? ?Date/Time:  11/21/2021  10:00AM-11:00AM ? ?Type of Therapy and Topic:  Group Therapy:  Music's Effect on Depression and Anxiety ? ?Participation Level:  Active  ? ?Description of Group: ?In this process group, members discussed what types of music trigger them to worsening mental health symptoms and what they can do about this in the future.  For instance, we discussed what to do when riding in a friend's car and a song harmful to the patient starts playing.  We also discussed how music can be used as a tool to help with wellness and recovery in various ways including managing depression and anxiety as well as encouraging healthy sleep habits and avoiding relapse into old negative behaviors such as drinking, self-harming, or staying in bed all day.  A variety of songs were played as examples.  Discussion was elicited which showed the group to be in agreement that the songs were quite relatable and have the potential to help them outside of the hospital setting. ? ?Therapeutic Goals: ?Patients will be introduced to a variety of songs that can relate to self-image and self-love in a helpful way. ?Patients will explore the impact of different pieces of music on their feelings, i.e. uplifting, triggering, etc. ?Patients will discuss how to use this self-knowledge to assist in recovery. ? ?Summary of Patient Progress:  At the beginning of group, patient expressed that music which might trigger her is some Lindsey Prince music and music that soothes her is anything upbeat.  She participated very well, remained for the whole group, and was calm throughout.  At the end of group patient reported that she felt better and more awake. ? ?Therapeutic Modalities: ?Solution Focused Brief Therapy ?Activity ? ? ?Ambrose Mantle, LCSW ? ?  ?

## 2021-11-21 NOTE — Progress Notes (Signed)
[facility] ? ?11/21/2021 7:20 AM ?Lindsey Jewosa M Swamy  ?MRN:  [id number] ? ?Chief Complaint: paranoia ? ?Reason for Admission:  ?Lindsey Prince is a 36 y.o. female with past psychiatric history of bipolar 1 disorder, PTSD and history of psychosis, who initially presented unaccompanied to [facility] ED with complaints of anxiety, auditory hallucinations, paranoia and mood lability. The patient is currently on Hospital Day 13.  ? ?Chart Review from last 24 hours:  ?The patient's chart was reviewed and nursing notes were reviewed. The patient's case was discussed in multidisciplinary team meeting.  Per nursing patient had no acute behavioral issues or safety concerns noted and she did not attend group.  Per MAR she was compliant with scheduled medications and did receive Tylenol x1 for pain.  As needed's were needed for agitation or anxiety. ? ?Information Obtained Today During Patient Interview: ?The patient was seen and evaluated on the unit.  She states that she is feeling groggy this morning and woke up feeling irritable.  She ruminates throughout the interview about missing her daughter and having paranoid thoughts today about going back to her ex after discharge.  When questioned about this abrupt change in comparison to her thoughts yesterday, she cannot give an answer as to what has changed her thinking.  She states she is paranoid about her ex and unknown others in the community.  She is irritable throughout most of the interview today.  She denies SI, HI, AVH ideas of reference or first rank symptoms.  She states her sleep and appetite have been good.She ir ruminative about somatic complaints including belief that she is gaining weight and belief that she has intermittent left LE edema and pain that comes and goes.  When confronted about not attending group yesterday, she becomes irritable and states that she went to the bathroom and when she returned group was over. She makes paranoid statement about  staff not reporting her group attendance and targeting her. After rounds she found me later on the unit and stated that her mood is feeling better as the morning has progressed. ? ?Principal Problem: Severe bipolar I disorder, current or most recent episode depressed (HCC) ?Diagnosis: Principal Problem: ?  Severe bipolar I disorder, current or most recent episode depressed (HCC) ?Active Problems: ?  PTSD (post-traumatic stress disorder) ?  Bipolar I, most recent episode mixed, severe with psychotic behavior (HCC) ?  Bipolar 1 disorder (HCC) ? ?Total Time Spent in Direct Patient Care:  ?I personally spent 25 minutes on the unit in direct patient care. The direct patient care time included face-to-face time with the patient, reviewing the patient's chart, communicating with other professionals, and coordinating care. Greater than 50% of this time was spent in counseling or coordinating care with the patient regarding goals of hospitalization, psycho-education, and discharge planning needs. ? ?Past Psychiatric History: see H&P ? ?Past Medical History:  ?Past Medical History:  ?Diagnosis Date  ? Anemia   ? Herpes   ?  ?Past Surgical History:  ?Procedure Laterality Date  ? NO PAST SURGERIES    ? ?Family History:  ?Family History  ?Problem Relation Age of Onset  ? Healthy Neg Hx   ? ?Family Psychiatric  History: see H&P ? ?Social History:  ?Social History  ? ?Substance and Sexual Activity  ?Alcohol Use No  ?   ?Social History  ? ?Substance and Sexual Activity  ?Drug Use Yes  ? Types: Marijuana  ?  ?Social History  ? ?Socioeconomic History  ?  Marital status: Single  ?  Spouse name: Not on file  ? Number of children: Not on file  ? Years of education: Not on file  ? Highest education level: Not on file  ?Occupational History  ? Not on file  ?Tobacco Use  ? Smoking status: Every Day  ?  Packs/day: 1.50  ?  Types: Cigarettes  ? Smokeless tobacco: Never  ?Substance and Sexual Activity  ? Alcohol use: No  ? Drug use: Yes  ?   Types: Marijuana  ? Sexual activity: Not Currently  ?Other Topics Concern  ? Not on file  ?Social History Narrative  ? Not on file  ? ?Social Determinants of Health  ? ?Financial Resource Strain: Not on file  ?Food Insecurity: Not on file  ?Transportation Needs: Not on file  ?Physical Activity: Not on file  ?Stress: Not on file  ?Social Connections: Not on file  ? ? ?Sleep: Good ? ?Appetite:  Good ? ?Current Medications: ?Current Facility-Administered Medications  ?Medication Dose Route Frequency Provider Last Rate Last Admin  ? acetaminophen (TYLENOL) tablet 650 mg  650 mg Oral Q6H PRN Rankin, [name], NP   650 mg at 11/20/21 1946  ? alum & mag hydroxide-simeth (MAALOX/MYLANTA) 200-200-20 MG/5ML suspension 30 mL  30 mL Oral Q4H PRN Rankin, [name], NP   30 mL at 11/18/21 1154  ? benztropine (COGENTIN) tablet 0.5 mg  0.5 mg Oral BID PRN Rankin, [name], NP      ? divalproex (DEPAKOTE ER) 24 hr tablet 750 mg  750 mg Oral QHS Doda, Traci Sermon, MD   750 mg at 11/20/21 2056  ? haloperidol (HALDOL) tablet 5 mg  5 mg Oral BID Karsten Ro, MD   5 mg at 11/20/21 1701  ? hydrOXYzine (ATARAX) tablet 50 mg  50 mg Oral Q6H PRN Roselle Locus, MD   50 mg at 11/19/21 2029  ? ibuprofen (ADVIL) tablet 400 mg  400 mg Oral Q6H PRN [name], [name], NP      ? magnesium hydroxide (MILK OF MAGNESIA) suspension 30 mL  30 mL Oral Daily PRN Rankin, [name], NP      ? melatonin tablet 5 mg  5 mg Oral QHS Karsten Ro, MD   5 mg at 11/20/21 2056  ? OLANZapine (ZYPREXA) tablet 15 mg  15 mg Oral QHS Hill, Shelbie Hutching, MD   15 mg at 11/20/21 2056  ? propranolol (INDERAL) tablet 10 mg  10 mg Oral TID Rankin, [name], NP   10 mg at 11/20/21 1701  ? sertraline (ZOLOFT) tablet 25 mg  25 mg Oral Daily Karsten Ro, MD   25 mg at 11/20/21 1610  ? ? ?Lab Results:  ?Results for orders placed or performed during the hospital encounter of 11/08/21 (from the past 48 hour(s))  ?Valproic acid level     Status: Abnormal  ? Collection Time:  11/20/21  6:41 PM  ?Result Value Ref Range  ? Valproic Acid Lvl 24 (L) 50.0 - 100.0 ug/mL  ?  Comment: Performed at [facility], [address] [place], [place] [address]  ?CBC with Differential/Platelet     Status: Abnormal  ? Collection Time: 11/20/21  6:41 PM  ?Result Value Ref Range  ? WBC 8.6 4.0 - 10.5 K/uL  ? RBC 3.99 3.87 - 5.11 MIL/uL  ? Hemoglobin 13.0 12.0 - 15.0 g/dL  ? HCT 38.1 36.0 - 46.0 %  ? MCV 95.5 80.0 - 100.0 fL  ? MCH  32.6 26.0 - 34.0 pg  ? MCHC 34.1 30.0 - 36.0 g/dL  ? RDW 12.8 11.5 - 15.5 %  ? Platelets 465 (H) 150 - 400 K/uL  ? nRBC 0.0 0.0 - 0.2 %  ? Neutrophils Relative % 45 %  ? Neutro Abs 3.9 1.7 - 7.7 K/uL  ? Lymphocytes Relative 42 %  ? Lymphs Abs 3.6 0.7 - 4.0 K/uL  ? Monocytes Relative 9 %  ? Monocytes Absolute 0.8 0.1 - 1.0 K/uL  ? Eosinophils Relative 3 %  ? Eosinophils Absolute 0.3 0.0 - 0.5 K/uL  ? Basophils Relative 1 %  ? Basophils Absolute 0.1 0.0 - 0.1 K/uL  ? Immature Granulocytes 0 %  ? Abs Immature Granulocytes 0.03 0.00 - 0.07 K/uL  ?  Comment: Performed at [facility], [address] [place], [place] [address]  ?Comprehensive metabolic panel     Status: Abnormal  ? Collection Time: 11/20/21  6:41 PM  ?Result Value Ref Range  ? Sodium 137 135 - 145 mmol/L  ? Potassium 4.2 3.5 - 5.1 mmol/L  ? Chloride 102 98 - 111 mmol/L  ? CO2 27 22 - 32 mmol/L  ? Glucose, Bld 106 (H) 70 - 99 mg/dL  ?  Comment: Glucose reference range applies only to samples taken after fasting for at least 8 hours.  ? BUN 14 6 - 20 mg/dL  ? Creatinine, Ser 0.68 0.44 - 1.00 mg/dL  ? Calcium 9.0 8.9 - 10.3 mg/dL  ? Total Protein 6.9 6.5 - 8.1 g/dL  ? Albumin 3.9 3.5 - 5.0 g/dL  ? AST 24 15 - 41 U/L  ? ALT 38 0 - 44 U/L  ? Alkaline Phosphatase 59 38 - 126 U/L  ? Total Bilirubin 0.2 (L) 0.3 - 1.2 mg/dL  ? GFR, Estimated >60 >60 mL/min  ?  Comment: (NOTE) ?Calculated using the CKD-EPI Creatinine Equation (2021) ?  ? Anion gap 8 5 - 15  ?  Comment: Performed at [facility], [address] [place], [place] [address]  ? ? ?Blood Alcohol level:  ?Lab Results  ?Component Value Date  ? ETH <10 11/06/2021  ? ETH <10 01/24/2021  ? ? ?Metabolic Disorder La

## 2021-11-21 NOTE — Progress Notes (Addendum)
Patient ID: Lindsey Prince, female   DOB: 08-31-1986, 36 y.o.   MRN: [id number] ? ? ?Pt given her scheduled Voltaren 1% topical gel to her left calf. Pt provided privacy and application was witnessed by Delicia, MHT. ?

## 2021-11-21 NOTE — Progress Notes (Addendum)
Patient ID: Lindsey Prince, female   DOB: October 11, 1985, 36 y.o.   MRN: [id number] ? ? ?Pt c/o of left calf pain at 6/10. Tylenol 650 mg PRN given. ?

## 2021-11-21 NOTE — Plan of Care (Signed)
?  Problem: Coping: Goal: Ability to verbalize frustrations and anger appropriately will improve Outcome: Progressing   Problem: Coping: Goal: Ability to demonstrate self-control will improve Outcome: Progressing   Problem: Safety: Goal: Periods of time without injury will increase Outcome: Progressing   Problem: Education: Goal: Knowledge of the prescribed therapeutic regimen will improve Outcome: Progressing   Problem: Coping: Goal: Coping ability will improve Outcome: Progressing   

## 2021-11-22 NOTE — Progress Notes (Signed)
[facility] MD Progress Note ? ?11/22/2021 3:23 PM ?Lindsey Prince  ?MRN:  [id number] ? ?Chief Complaint: paranoia ? ?Reason for Admission:  ?Lindsey Prince is a 36 y.o. female with past psychiatric history of bipolar 1 disorder, PTSD and history of psychosis, who initially presented unaccompanied to [facility] ED with complaints of anxiety, auditory hallucinations, paranoia and mood lability. The patient is currently on Hospital Day 14.  ? ?Chart Review from last 24 hours:  ?The patient's chart was reviewed and nursing notes were reviewed. The patient's case was discussed in multidisciplinary team meeting.  Per nursing patient had no acute behavioral issues or safety concerns noted and attended groups this morning.  Per MAR she was compliant with scheduled medications and did receive Vistaril x1 for anxiety and Tylenol x1 for pain yesterday.   ? ?Information Obtained Today During Patient Interview: ?The patient was seen and evaluated on the unit.  She reports improvement in her mood.  She reports that she was thinking back and forth about going back to her ex. But decided that she is going back to her ex because her daughter is there and she needs her.  She reports that she is going to work with her ex.  She reports that her ex is learning anger management techniques. She reports that she will also look for mental health rehab for her daughter as she has anger issues sometimes. ?She  reports that she is not feeling paranoid today and is able to do reality testing. She denies SI, HI, AVH, ideas of reference or first rank symptoms.  She states her sleep and appetite have been good. she did not report any somatic complaints.  She did not make any paranoid statements about staff or other patients.  Discussed that we increased Haldol and decreasing Zyprexa so that she can be on 1 antipsychotic.  Discussed that it can also be done on outpatient basis.  Discussed about LAI.  Patient states she would be interested in LAI.   Discussed that we can think about LAI once we know therapeutic dose of Haldol for her.  She verbalizes understanding.  Discussed the need to monitor high triglycerides, abnormal EKG, and high platelets by PCP after discharge.  Patient verbalizes understanding. ? ?Principal Problem: Severe bipolar I disorder, current or most recent episode depressed (HCC) ?Diagnosis: Principal Problem: ?  Severe bipolar I disorder, current or most recent episode depressed (HCC) ?Active Problems: ?  PTSD (post-traumatic stress disorder) ?  Bipolar I, most recent episode mixed, severe with psychotic behavior (HCC) ?  Bipolar 1 disorder (HCC) ? ?Total Time Spent in Direct Patient Care:  ?I personally spent 25 minutes on the unit in direct patient care. The direct patient care time included face-to-face time with the patient, reviewing the patient's chart, communicating with other professionals, and coordinating care. Greater than 50% of this time was spent in counseling or coordinating care with the patient regarding goals of hospitalization, psycho-education, and discharge planning needs. ? ?Past Psychiatric History: see H&P ? ?Past Medical History:  ?Past Medical History:  ?Diagnosis Date  ? Anemia   ? Herpes   ?  ?Past Surgical History:  ?Procedure Laterality Date  ? NO PAST SURGERIES    ? ?Family History:  ?Family History  ?Problem Relation Age of Onset  ? Healthy Neg Hx   ? ?Family Psychiatric  History: see H&P ? ?Social History:  ?Social History  ? ?Substance and Sexual Activity  ?Alcohol Use No  ?   ?Social  History  ? ?Substance and Sexual Activity  ?Drug Use Yes  ? Types: Marijuana  ?  ?Social History  ? ?Socioeconomic History  ? Marital status: Single  ?  Spouse name: Not on file  ? Number of children: Not on file  ? Years of education: Not on file  ? Highest education level: Not on file  ?Occupational History  ? Not on file  ?Tobacco Use  ? Smoking status: Every Day  ?  Packs/day: 1.50  ?  Types: Cigarettes  ? Smokeless  tobacco: Never  ?Substance and Sexual Activity  ? Alcohol use: No  ? Drug use: Yes  ?  Types: Marijuana  ? Sexual activity: Not Currently  ?Other Topics Concern  ? Not on file  ?Social History Narrative  ? Not on file  ? ?Social Determinants of Health  ? ?Financial Resource Strain: Not on file  ?Food Insecurity: Not on file  ?Transportation Needs: Not on file  ?Physical Activity: Not on file  ?Stress: Not on file  ?Social Connections: Not on file  ? ? ?Sleep: Good ? ?Appetite:  Good ? ?Current Medications: ?Current Facility-Administered Medications  ?Medication Dose Route Frequency Provider Last Rate Last Admin  ? acetaminophen (TYLENOL) tablet 650 mg  650 mg Oral Q6H PRN Rankin, [name], NP   650 mg at 11/21/21 0739  ? alum & mag hydroxide-simeth (MAALOX/MYLANTA) 200-200-20 MG/5ML suspension 30 mL  30 mL Oral Q4H PRN Rankin, [name], NP   30 mL at 11/18/21 1154  ? benztropine (COGENTIN) tablet 0.5 mg  0.5 mg Oral BID PRN Rankin, [name], NP      ? diclofenac Sodium (VOLTAREN) 1 % topical gel 2 g  2 g Topical QID Bartholomew CrewsSingleton, [name], MD   2 g at 11/22/21 1258  ? divalproex (DEPAKOTE ER) 24 hr tablet 1,000 mg  1,000 mg Oral QHS Mason [name], [name], MD   1,000 mg at 11/21/21 2108  ? haloperidol (HALDOL) tablet 7.5 mg  7.5 mg Oral BID Mason [name], [name], MD   7.5 mg at 11/22/21 0825  ? hydrOXYzine (ATARAX) tablet 50 mg  50 mg Oral Q6H PRN Roselle LocusHill, Stephanie Leigh, MD   50 mg at 11/22/21 1258  ? ibuprofen (ADVIL) tablet 400 mg  400 mg Oral Q6H PRN [name], [name], NP      ? magnesium hydroxide (MILK OF MAGNESIA) suspension 30 mL  30 mL Oral Daily PRN Rankin, [name], NP      ? melatonin tablet 5 mg  5 mg Oral QHS Karsten Rooda, Boyde Grieco, MD   5 mg at 11/21/21 2108  ? OLANZapine (ZYPREXA) tablet 10 mg  10 mg Oral QHS Comer LocketSingleton, [name], MD   10 mg at 11/21/21 2108  ? propranolol (INDERAL) tablet 10 mg  10 mg Oral TID Rankin, [name], NP   10 mg at 11/22/21 1258  ? sertraline (ZOLOFT) tablet 25 mg  25 mg Oral Daily Karsten Rooda, Landra Howze, MD   25  mg at 11/22/21 0825  ? ? ?Lab Results:  ?Results for orders placed or performed during the hospital encounter of 11/08/21 (from the past 48 hour(s))  ?Valproic acid level     Status: Abnormal  ? Collection Time: 11/20/21  6:41 PM  ?Result Value Ref Range  ? Valproic Acid Lvl 24 (L) 50.0 - 100.0 ug/mL  ?  Comment: Performed at [facility], [address] [place], [place] [id number]  ?CBC with Differential/Platelet     Status: Abnormal  ?  Collection Time: 11/20/21  6:41 PM  ?Result Value Ref Range  ? WBC 8.6 4.0 - 10.5 K/uL  ? RBC 3.99 3.87 - 5.11 MIL/uL  ? Hemoglobin 13.0 12.0 - 15.0 g/dL  ? HCT 38.1 36.0 - 46.0 %  ? MCV 95.5 80.0 - 100.0 fL  ? MCH 32.6 26.0 - 34.0 pg  ? MCHC 34.1 30.0 - 36.0 g/dL  ? RDW 12.8 11.5 - 15.5 %  ? Platelets 465 (H) 150 - 400 K/uL  ? nRBC 0.0 0.0 - 0.2 %  ? Neutrophils Relative % 45 %  ? Neutro Abs 3.9 1.7 - 7.7 K/uL  ? Lymphocytes Relative 42 %  ? Lymphs Abs 3.6 0.7 - 4.0 K/uL  ? Monocytes Relative 9 %  ? Monocytes Absolute 0.8 0.1 - 1.0 K/uL  ? Eosinophils Relative 3 %  ? Eosinophils Absolute 0.3 0.0 - 0.5 K/uL  ? Basophils Relative 1 %  ? Basophils Absolute 0.1 0.0 - 0.1 K/uL  ? Immature Granulocytes 0 %  ? Abs Immature Granulocytes 0.03 0.00 - 0.07 K/uL  ?  Comment: Performed at [facility], [address], [place], [place] [address]  ?Comprehensive metabolic panel     Status: Abnormal  ? Collection Time: 11/20/21  6:41 PM  ?Result Value Ref Range  ? Sodium 137 135 - 145 mmol/L  ? Potassium 4.2 3.5 - 5.1 mmol/L  ? Chloride 102 98 - 111 mmol/L  ? CO2 27 22 - 32 mmol/L  ? Glucose, Bld 106 (H) 70 - 99 mg/dL  ?  Comment: Glucose reference range applies only to samples taken after fasting for at least 8 hours.  ? BUN 14 6 - 20 mg/dL  ? Creatinine, Ser 0.68 0.44 - 1.00 mg/dL  ? Calcium 9.0 8.9 - 10.3 mg/dL  ? Total Protein 6.9 6.5 - 8.1 g/dL  ? Albumin 3.9 3.5 - 5.0 g/dL  ? AST 24 15 - 41 U/L  ? ALT 38 0 - 44 U/L  ? Alkaline Phosphatase 59 38 - 126 U/L   ? Total Bilirubin 0.2 (L) 0.3 - 1.2 mg/dL  ? GFR, Estimated >60 >60 mL/min  ?  Comment: (NOTE) ?Calculated using the CKD-EPI Creatinine Equation (2021) ?  ? Anion gap 8 5 - 15  ?  Comment: Performed at [facility]

## 2021-11-22 NOTE — Progress Notes (Signed)
?   11/22/21 0500  ?Sleep  ?Number of Hours 8.75  ? ? ?

## 2021-11-22 NOTE — Progress Notes (Signed)
?   11/22/21 2200  ?Psych Admission Type (Psych Patients Only)  ?Admission Status Voluntary  ?Psychosocial Assessment  ?Patient Complaints None  ?Eye Contact Fair  ?Facial Expression Anxious  ?Affect Anxious  ?Speech Logical/coherent  ?Interaction Assertive  ?Motor Activity Slow  ?Appearance/Hygiene Unremarkable  ?Behavior Characteristics Cooperative  ?Mood Pleasant  ?Aggressive Behavior  ?Effect No apparent injury  ?Thought Process  ?Coherency WDL;Circumstantial  ?Content WDL;Blaming others  ?Delusions None reported or observed  ?Perception WDL  ?Hallucination None reported or observed  ?Judgment Poor  ?Confusion None  ?Danger to Self  ?Current suicidal ideation? Denies  ?Danger to Others  ?Danger to Others None reported or observed  ? ? ?

## 2021-11-22 NOTE — Group Note (Signed)
LCSW Group Therapy Note ? ?Group Date: 11/22/2021 ?Start Time: 1300 ?End Time: 1400 ? ? ?Type of Therapy and Topic:  Group Therapy: Anger Cues and Responses ? ?Participation Level:  Active ? ? ?Description of Group:   ?In this group, patients learned how to recognize the physical, cognitive, emotional, and behavioral responses they have to anger-provoking situations.  They identified a recent time they became angry and how they reacted.  They analyzed how their reaction was possibly beneficial and how it was possibly unhelpful.  The group discussed a variety of healthier coping skills that could help with such a situation in the future.  Focus was placed on how helpful it is to recognize the underlying emotions to our anger, because working on those can lead to a more permanent solution as well as our ability to focus on the important rather than the urgent. ? ?Therapeutic Goals: ?Patients will remember their last incident of anger and how they felt emotionally and physically, what their thoughts were at the time, and how they behaved. ?Patients will identify how their behavior at that time worked for them, as well as how it worked against them. ?Patients will explore possible new behaviors to use in future anger situations. ?Patients will learn that anger itself is normal and cannot be eliminated, and that healthier reactions can assist with resolving conflict rather than worsening situations. ? ?Summary of Patient Progress:  Patient was given packet for group and spoke with CSW one on one.  ? ?Therapeutic Modalities:   ?Cognitive Behavioral Therapy ? ? ? ?Vassie Moselle, LCSW ?11/22/2021  1:50 PM   ? ?

## 2021-11-22 NOTE — Group Note (Signed)
Recreation Therapy Group Note ? ? ?Group Topic:Problem Solving  ?Group Date: 11/22/2021 ?Start Time: 1010 ?End Time: 1100 ?Facilitators: Caroll Rancher, LRT,CTRS ?Location: [address] Day[address] ? ? ?Goal Area(s) Addresses:  ?Patient will verbalize importance of using appropriate problem solving techniques.  ?Patient will identify positive change associated with effective problem solving skills.  ? ?Group Description:  Brain Teasers.  Patients were given two worksheets with brain teasers.  Each patient was to use the clues available to figure what each brain teaser ment.  Patients were to also use their reasoning and problem solving skills to complete the worksheets.   ? ? ?Affect/Mood: Appropriate ?  ?Participation Level: Engaged ?  ?Participation Quality: Independent ?  ?Behavior: Appropriate ?  ?Speech/Thought Process: Focused ?  ?Insight: Good ?  ?Judgement: Good ?  ?Modes of Intervention: Problem-solving ?  ?Patient Response to Interventions:  Engaged ?  ?Education Outcome: ? Acknowledges education and In group clarification offered   ? ?Clinical Observations/Individualized Feedback: Pt was bright and engaged with activity.  Pt was focused and did well in completing most of the teasers.  Pt expressed she did good based on having some motivation and self confidence.    ? ? ?Plan: Continue to engage patient in RT group sessions 2-3x/week. ? ? ?Caroll Rancher, LRT,CTRS ?11/22/2021 12:29 PM ?

## 2021-11-22 NOTE — [facility] Group Notes (Signed)
Adult Psychoeducational Group Note ? ?Date:  11/22/2021 ?Time:  7:58 PM ? ?Group Topic/Focus:  ?Wrap-Up Group:   The focus of this group is to help patients review their daily goal of treatment and discuss progress on daily workbooks. ? ?Participation Level:  Active ? ?Participation Quality:  Attentive ? ?Affect:  Appropriate ? ?Cognitive:  Alert ? ?Insight: Appropriate ? ?Engagement in Group:  Engaged ? ?Modes of Intervention:  Discussion ? ?Additional Comments:  Patient attended and participated in the Wrap-up group. ? ?Jearl Klinefelter ?11/22/2021, 7:58 PM ?

## 2021-11-23 MED ORDER — OLANZAPINE 5 MG PO TABS
5.0000 mg | ORAL_TABLET | Freq: Every day | ORAL | Status: DC
  Filled 2021-11-23 (×2): qty 1

## 2021-11-23 MED ORDER — HALOPERIDOL DECANOATE 100 MG/ML IM SOLN
50.0000 mg | Freq: Once | INTRAMUSCULAR | Status: AC
  Administered 2021-11-23: 50 mg via INTRAMUSCULAR
  Filled 2021-11-23: qty 0.5

## 2021-11-23 MED ORDER — OLANZAPINE 5 MG PO TABS
5.0000 mg | ORAL_TABLET | Freq: Every day | ORAL | Status: AC
  Administered 2021-11-23: 5 mg via ORAL
  Filled 2021-11-23: qty 1

## 2021-11-23 NOTE — Group Note (Signed)
Recreation Therapy Group Note ? ? ?Group Topic:Coping Skills  ?Group Date: 11/23/2021 ?Start Time: 1000 ?End Time: [id number] ?Facilitators: Victorino Sparrow, LRT,CTRS ?Location: [facility] ? ? ?Goal Area(s) Addresses: ?Patient will define what a coping skill is. ?Patient will successfully identify positive coping skills they can use post d/c.  ?Patient will acknowledge benefit(s) of using learned coping skills post d/c. ?  ?Group Description:  Coping A to Z. Patient asked to identify what a coping skill is and when they use them. Patients with Probation officer discussed healthy versus unhealthy coping skills. Next patients were given a blank worksheet titled "Coping Skills A-Z".  Patients were instructed to come up with at least one positive coping skill per letter of the alphabet.  Patients were given 15 minutes to brainstorm to come up their coping skills, before ideas were presented to the large group. Patients and LRT debriefed on the importance of coping skill selection based on situation and back-up plans when a skill tried is not effective. At the end of group, patients were given an handout of alphabetized strategies to keep for future reference. ? ? ?Affect/Mood: Appropriate ?  ?Participation Level: Engaged ?  ?Participation Quality: Independent ?  ?Behavior: Appropriate ?  ?Speech/Thought Process: Focused ?  ?Insight: Good ?  ?Judgement: Good ?  ?Modes of Intervention: Worksheet ?  ?Patient Response to Interventions:  Engaged ?  ?Education Outcome: ? Acknowledges education and In group clarification offered   ? ?Clinical Observations/Individualized Feedback: Pt was bright and appropriate during group session.  Pt was bright and social with peers as well.  Pt identified coping skills something that "keeps you from going crazy".  Pt went on to say coping skills can be used for depression, anxiety and anger.  Pt appeared more confident in her answers.  Pt identified some positive coping skills as aerobics, bird  watching, eating healthy, going for walk, hiking, listening to music, meditate, reading, singing and taking a shower.  ? ? ?Plan: Continue to engage patient in RT group sessions 2-3x/week. ? ? ?Victorino Sparrow, LRT,CTRS ?11/23/2021 10:44 AM ?

## 2021-11-23 NOTE — Progress Notes (Signed)
Psychoeducational Group Note ? ?Date:  11/23/2021 ?Time:  2222 ? ?Group Topic/Focus:  ?Wrap-Up Group:   The focus of this group is to help patients review their daily goal of treatment and discuss progress on daily workbooks. ? ?Participation Level: Did Not Attend ? ?Participation Quality:  Not Applicable ? ?Affect:  Not Applicable ? ?Cognitive:  Not Applicable ? ?Insight:  Not Applicable ? ?Engagement in Group: Not Applicable ? ?Additional Comments:  The patient did not come to group this evening.  ? ?[name] S ?11/23/2021, 10:22 PM  ?

## 2021-11-23 NOTE — Progress Notes (Addendum)
[facility] MD Progress Note ? ?11/23/2021 7:48 AM ?Lindsey Prince  ?MRN:  [id number] ? ?Chief Complaint: paranoia ? ?Reason for Admission:  ?Lindsey Prince is a 36 y.o. female with past psychiatric history of bipolar 1 disorder, PTSD and history of psychosis, who initially presented unaccompanied to [facility], ED with complaints of anxiety, auditory hallucinations, paranoia and mood lability. The patient is currently on Hospital Day 15.  ? ?Chart Review from last 24 hours:  ?The patient's chart was reviewed and nursing notes were reviewed. The patient's case was discussed in multidisciplinary team meeting.  Per nursing patient had no acute behavioral issues or safety concerns noted and attended groups this morning.  Per [facility] she was compliant with scheduled medications and did receive Vistaril x2 for anxiety  yesterday.   ? ?Information Obtained Today During Patient Interview: ?The patient was seen and evaluated on the unit.  She reports improvement in her mood.  She has decided that she is going back to her ex.  She reports that little bit paranoia is going to be always there due to her upbringing.  She is able to do reality testing. She denies SI, HI, AVH, ideas of reference or first rank symptoms.  She states her sleep and appetite have been good.  She did not make any paranoid statements about staff or other patients.  Discussed again the option for LAI.  Patient is interested in Haldol LAI.  Discussed that we are tapering Zyprexa and will stop soon and we'll order LAI today.  She verbalizes understanding.  Discussed the need to monitor high triglycerides, abnormal EKG, and high platelets by PCP after discharge.   ? ?Principal Problem: Severe bipolar I disorder, current or most recent episode depressed (HCC) ?Diagnosis: Principal Problem: ?  Severe bipolar I disorder, current or most recent episode depressed (HCC) ?Active Problems: ?  PTSD (post-traumatic stress disorder) ?  Bipolar I, most recent episode mixed, severe  with psychotic behavior (HCC) ?  Bipolar 1 disorder (HCC) ? ?Total Time Spent in Direct Patient Care:  ?I personally spent 25 minutes on the unit in direct patient care. The direct patient care time included face-to-face time with the patient, reviewing the patient's chart, communicating with other professionals, and coordinating care. Greater than 50% of this time was spent in counseling or coordinating care with the patient regarding goals of hospitalization, psycho-education, and discharge planning needs. ? ?Past Psychiatric History: see H&P ? ?Past Medical History:  ?Past Medical History:  ?Diagnosis Date  ? Anemia   ? Herpes   ?  ?Past Surgical History:  ?Procedure Laterality Date  ? NO PAST SURGERIES    ? ?Family History:  ?Family History  ?Problem Relation Age of Onset  ? Healthy Neg Hx   ? ?Family Psychiatric  History: see H&P ? ?Social History:  ?Social History  ? ?Substance and Sexual Activity  ?Alcohol Use No  ?   ?Social History  ? ?Substance and Sexual Activity  ?Drug Use Yes  ? Types: Marijuana  ?  ?Social History  ? ?Socioeconomic History  ? Marital status: Single  ?  Spouse name: Not on file  ? Number of children: Not on file  ? Years of education: Not on file  ? Highest education level: Not on file  ?Occupational History  ? Not on file  ?Tobacco Use  ? Smoking status: Every Day  ?  Packs/day: 1.50  ?  Types: Cigarettes  ? Smokeless tobacco: Never  ?Substance and Sexual Activity  ?  Alcohol use: No  ? Drug use: Yes  ?  Types: Marijuana  ? Sexual activity: Not Currently  ?Other Topics Concern  ? Not on file  ?Social History Narrative  ? Not on file  ? ?Social Determinants of Health  ? ?Financial Resource Strain: Not on file  ?Food Insecurity: Not on file  ?Transportation Needs: Not on file  ?Physical Activity: Not on file  ?Stress: Not on file  ?Social Connections: Not on file  ? ? ?Sleep: Good ? ?Appetite:  Good ? ?Current Medications: ?Current Facility-Administered Medications  ?Medication Dose Route  Frequency Provider Last Rate Last Admin  ? acetaminophen (TYLENOL) tablet 650 mg  650 mg Oral Q6H PRN Rankin, [name], NP   650 mg at 11/21/21 0739  ? alum & mag hydroxide-simeth (MAALOX/MYLANTA) 200-200-20 MG/5ML suspension 30 mL  30 mL Oral Q4H PRN Rankin, [name], NP   30 mL at 11/18/21 1154  ? benztropine (COGENTIN) tablet 0.5 mg  0.5 mg Oral BID PRN Rankin, [name], NP      ? diclofenac Sodium (VOLTAREN) 1 % topical gel 2 g  2 g Topical QID Bartholomew Crews E, MD   2 g at 11/22/21 2055  ? divalproex (DEPAKOTE ER) 24 hr tablet 1,000 mg  1,000 mg Oral QHS Mason Jim, [name], MD   1,000 mg at 11/22/21 2031  ? haloperidol (HALDOL) tablet 7.5 mg  7.5 mg Oral BID Mason Jim, [name], MD   7.5 mg at 11/22/21 1710  ? hydrOXYzine (ATARAX) tablet 50 mg  50 mg Oral Q6H PRN Roselle Locus, MD   50 mg at 11/22/21 2031  ? ibuprofen (ADVIL) tablet 400 mg  400 mg Oral Q6H PRN [name], [name], NP      ? magnesium hydroxide (MILK OF MAGNESIA) suspension 30 mL  30 mL Oral Daily PRN Rankin, [name], NP      ? melatonin tablet 5 mg  5 mg Oral QHS Karsten Ro, MD   5 mg at 11/22/21 2031  ? OLANZapine (ZYPREXA) tablet 10 mg  10 mg Oral QHS Comer Locket, MD   10 mg at 11/22/21 2031  ? propranolol (INDERAL) tablet 10 mg  10 mg Oral TID Rankin, [name], NP   10 mg at 11/22/21 1710  ? sertraline (ZOLOFT) tablet 25 mg  25 mg Oral Daily Karsten Ro, MD   25 mg at 11/22/21 0825  ? ? ?Lab Results: No results found for this or any previous visit (from the past 48 hour(s)). ? ? ?Blood Alcohol level:  ?Lab Results  ?Component Value Date  ? ETH <10 11/06/2021  ? ETH <10 01/24/2021  ? ? ?Metabolic Disorder Labs: ?Lab Results  ?Component Value Date  ? HGBA1C 5.2 11/17/2021  ? MPG 103 11/17/2021  ? MPG 108.28 01/24/2021  ? ?No results found for: PROLACTIN ?Lab Results  ?Component Value Date  ? CHOL 171 11/17/2021  ? TRIG 172 (H) 11/17/2021  ? HDL 58 11/17/2021  ? CHOLHDL 2.9 11/17/2021  ? VLDL 34 11/17/2021  ? LDLCALC 79 11/17/2021   ? LDLCALC 47 01/24/2021  ? ? ?Physical Findings: ?AIMS: Facial and Oral Movements ?Muscles of Facial Expression: None, normal ?Lips and Perioral Area: None, normal ?Jaw: None, normal ?Tongue: None, normal,Extremity Movements ?Upper (arms, wrists, hands, fingers): None, normal ?Lower (legs, knees, ankles, toes): None, normal, Trunk Movements ?Neck, shoulders, hips: None, normal, Overall Severity ?Severity of abnormal movements (highest score from questions above): None, normal ?Incapacitation due to abnormal movements:  None, normal ?Patient's awareness of abnormal movements (rate only patient's report): No Awareness, Dental Status ?Current problems with teeth and/or dentures?: No ?Does patient usually wear dentures?: No  ? ?Musculoskeletal: ?Strength & Muscle Tone: within normal limits ?Gait & Station: normal ?Patient leans: N/A ? ?Psychiatric Specialty Exam: ? ?Presentation  ?General Appearance: Wearing scrubs, adequate hygiene ? ?Eye Contact:Fair ? ?Speech:Clear and coherent, normal rate ? ?Speech Volume:Normal ? ?Mood and Affect  ?Mood:"good" ? ?Affect:constricted ? ? ?Thought Process  ?Thought Processes: linear ? ?Orientation:Full (Time, Place and Person) ? ?Thought Content: She denies AVH, ideas of reference or first rank symptoms. ;less paranoid today about ex, able to do reality testing.  Does not make paranoid/persecutory statement about staff; is not grossly responding to internal/external stimuli on exam ? ?Hallucinations:Denied ? ?Ideas of Reference:Denied ? ?Suicidal Thoughts:Denied ? ?Homicidal Thoughts:Denied ? ?Sensorium  ?Memory:Immediate Fair; Recent Fair; Remote Fair ? ?Judgment:Fair ? ?Insight:Fair ? ? ?Executive Functions  ?Concentration:Fair ? ?Attention Span:Fair ? ?Recall:Fair ? ?Fund of Knowledge:Fair ? ?Language:Good ? ? ?Psychomotor Activity  ?Psychomotor Activity:Normal ? ?Assets  ?Assets:Physical Health; Communication Skills; Resilience ? ? ?Sleep  ?7 hours ? ? ?Physical Exam ?Vitals and  nursing note reviewed.  ?HENT:  ?   Head: Normocephalic.  ?Pulmonary:  ?   Effort: Pulmonary effort is normal.  ?Neurological:  ?   General: No focal deficit present.  ?   Mental Status: She is alert

## 2021-11-23 NOTE — Progress Notes (Signed)
?   11/23/21 0500  ?Sleep  ?Number of Hours 7  ? ? ?

## 2021-11-23 NOTE — Progress Notes (Signed)
D:  Faustine was in the day room much of the evening after obtaining snack.  She denied SI/HI or AVH.  She denied any depression or anxiety this evening.  She reported she is being discharged tomorrow and is looking forward to going home.  She took her hs medications without difficulty.  No prn's requested at this time.  ?A:  1:1 with RN for support and encouragement.  Medications given as ordered.  Q 15 minute checks maintained for safety.  Encouraged participation in group and unit activities.   ?R:  She is currently resting with her eyes closed and appears to be asleep.  She remains safe on the unit.   ? ? 11/23/21 2109  ?Psych Admission Type (Psych Patients Only)  ?Admission Status Voluntary  ?Psychosocial Assessment  ?Patient Complaints None  ?Eye Contact Fair  ?Facial Expression Animated  ?Affect Appropriate to circumstance  ?Speech Logical/coherent  ?Interaction Assertive  ?Motor Activity Other (Comment) ?(unremarkable)  ?Appearance/Hygiene Unremarkable  ?Behavior Characteristics Cooperative;Appropriate to situation  ?Mood Anxious;Pleasant  ?Thought Process  ?Coherency WDL  ?Content Blaming others;Blaming self  ?Delusions None reported or observed  ?Perception WDL  ?Hallucination None reported or observed  ?Judgment Impaired  ?Confusion None  ?Danger to Self  ?Current suicidal ideation? Denies  ?Danger to Others  ?Danger to Others None reported or observed  ? ? ?

## 2021-11-24 ENCOUNTER — Encounter (HOSPITAL_COMMUNITY): Payer: Self-pay

## 2021-11-24 LAB — RESP PANEL BY RT-PCR (FLU A&B, COVID) ARPGX2
Influenza A by PCR: NEGATIVE
Influenza B by PCR: NEGATIVE
SARS Coronavirus 2 by RT PCR: NEGATIVE

## 2021-11-24 MED ORDER — HALOPERIDOL 5 MG PO TABS
5.0000 mg | ORAL_TABLET | Freq: Two times a day (BID) | ORAL | Status: DC
  Administered 2021-11-24 – 2021-11-25 (×2): 5 mg via ORAL
  Filled 2021-11-24 (×2): qty 1
  Filled 2021-11-24 (×4): qty 14

## 2021-11-24 NOTE — Progress Notes (Signed)
The patient spoke briefly with this Pryor Curia and stated that she is feeling better today. She is not aware of her discharge date or of her discharge plans at this time.  ?

## 2021-11-24 NOTE — Group Note (Signed)
Recreation Therapy Group Note ? ? ?Group Topic:Self-Esteem  ?Group Date: 11/24/2021 ?Start Time: 1005 ?End Time: [id number] ?Facilitators: Victorino Sparrow, LRT,CTRS ?Location: [place] ? ? ?Goal Area(s) Addresses:  ?Patient will identify and write at least one positive trait about themself. ?Patient will acknowledge the benefit of healthy self-esteem. ?Patient will endorse understanding of ways to increase self-esteem.  ? ?Group Description:  LRT began group session with open dialogue asking the patients to define self-esteem and verbally identify positive qualities and traits people may possess. Patients were then instructed to design a personalized license plate, with words and drawings, representing positive things about themselves. Pts were encouraged to include favorites, things they are proud of, what they enjoy doing, and dreams for their future. If a patient had a life motto or a meaningful phase that expressed their life values, pt's were asked to incorporate that into their design as well. Patients were given the opportunity to share their completed work with the group. ? ? ?Affect/Mood: Appropriate ?  ?Participation Level: Engaged ?  ?Participation Quality: Independent ?  ?Behavior: Appropriate ?  ?Speech/Thought Process: Focused ?  ?Insight: Good ?  ?Judgement: Good ?  ?Modes of Intervention: Art ?  ?Patient Response to Interventions:  Engaged ?  ?Education Outcome: ? Acknowledges education and In group clarification offered   ? ?Clinical Observations/Individualized Feedback: Pt was bright and engaged.  Pt was a little hesitant in the beginning.  Pt wasn't sure she could identify any positive things about herself or things she likes.  With encouragement from LRT, pt was able to identify some positive things.  Pt identified [place] and the [employer] as favorite sports teams, likes trees and bird watching, things of herself as a shooting star, a heart to represent drawing hearts everywhere  growing up and a rose to represent her name.   Pt appeared to be proud of her plate and what she was able to identify. ? ?   ?Plan: Continue to engage patient in RT group sessions 2-3x/week. ? ? ?Victorino Sparrow, LRT,CTRS ?11/24/2021 11:15 AM ?

## 2021-11-24 NOTE — Progress Notes (Signed)
Yamira reported her day as "ok."  She attributes this to being in her room all day but did state "I don't want to get anything anyway."  Book was provided by [facility] per her request.  She denied SI/HI or AVH.  She voiced no questions or issues at this time.  No prn's requested at this time.  Q 15 minute checks maintained for safety.  She remains safe on the unit. ? ? 11/24/21 2137  ?Psych Admission Type (Psych Patients Only)  ?Admission Status Voluntary  ?Psychosocial Assessment  ?Patient Complaints Other (Comment) ?(AVH)  ?Eye Contact Fair  ?Facial Expression Flat  ?Affect Appropriate to circumstance  ?Speech Logical/coherent  ?Interaction Assertive  ?Motor Activity Other (Comment) ?(unremarkable)  ?Appearance/Hygiene Unremarkable  ?Behavior Characteristics Cooperative;Appropriate to situation  ?Mood Euthymic;Pleasant  ?Thought Process  ?Coherency WDL  ?Content Blaming others;Blaming self  ?Delusions None reported or observed  ?Perception Hallucinations  ?Hallucination Auditory;Visual  ?Judgment Impaired  ?Confusion None  ?Danger to Self  ?Current suicidal ideation? Denies  ?Danger to Others  ?Danger to Others None reported or observed  ? ? ?

## 2021-11-24 NOTE — Progress Notes (Signed)
?   11/24/21 1352  ?Psych Admission Type (Psych Patients Only)  ?Admission Status Voluntary  ?Psychosocial Assessment  ?Patient Complaints None  ?Eye Contact Fair  ?Facial Expression Flat  ?Affect Appropriate to circumstance  ?Speech Logical/coherent  ?Interaction Assertive  ?Motor Activity Other (Comment) ?(WNL)  ?Appearance/Hygiene Unremarkable  ?Behavior Characteristics Cooperative;Appropriate to situation  ?Mood Euthymic;Pleasant  ?Thought Process  ?Coherency WDL  ?Content Blaming self;Blaming others  ?Delusions None reported or observed  ?Perception WDL  ?Hallucination None reported or observed  ?Judgment Impaired  ?Confusion None  ?Danger to Self  ?Current suicidal ideation? Denies  ?Danger to Others  ?Danger to Others None reported or observed  ? ? ?

## 2021-11-24 NOTE — Plan of Care (Signed)
?  Problem: Activity: ?Goal: Interest or engagement in activities will improve ?Outcome: Progressing ?  ?Problem: Coping: ?Goal: Ability to verbalize frustrations and anger appropriately will improve ?Outcome: Progressing ?  ?Problem: Physical Regulation: ?Goal: Ability to maintain clinical measurements within normal limits will improve ?Outcome: Progressing ?  ?Problem: Education: ?Goal: Knowledge of the prescribed therapeutic regimen will improve ?Outcome: Progressing ?  ?Problem: Role Relationship: ?Goal: Will demonstrate positive changes in social behaviors and relationships ?Outcome: Progressing ?  ?Problem: Coping: ?Goal: Ability to identify and develop effective coping behavior will improve ?Outcome: Progressing ?  ?

## 2021-11-24 NOTE — BH IP Treatment Plan (Signed)
Interdisciplinary Treatment and Diagnostic Plan Update ? ?11/24/2021 ?Time of Session: 9:40am  ?Lindsey Prince ?MRN: [id number] ? ?Principal Diagnosis: Severe bipolar I disorder, current or most recent episode depressed (HCC) ? ?Secondary Diagnoses: Principal Problem: ?  Severe bipolar I disorder, current or most recent episode depressed (HCC) ?Active Problems: ?  PTSD (post-traumatic stress disorder) ?  Bipolar I, most recent episode mixed, severe with psychotic behavior (HCC) ?  Bipolar 1 disorder (HCC) ? ? ?Current Medications:  ?Current Facility-Administered Medications  ?Medication Dose Route Frequency Provider Last Rate Last Admin  ? acetaminophen (TYLENOL) tablet 650 mg  650 mg Oral Q6H PRN Rankin, [name], NP   650 mg at 11/21/21 0739  ? alum & mag hydroxide-simeth (MAALOX/MYLANTA) 200-200-20 MG/5ML suspension 30 mL  30 mL Oral Q4H PRN Rankin, [name], NP   30 mL at 11/18/21 1154  ? benztropine (COGENTIN) tablet 0.5 mg  0.5 mg Oral BID PRN Rankin, [name], NP      ? diclofenac Sodium (VOLTAREN) 1 % topical gel 2 g  2 g Topical QID Comer LocketSingleton, [name], MD   2 g at 11/24/21 [id number]  ? divalproex (DEPAKOTE ER) 24 hr tablet 1,000 mg  1,000 mg Oral QHS Mason [name], [name], MD   1,000 mg at 11/23/21 2121  ? haloperidol (HALDOL) tablet 5 mg  5 mg Oral BID Karsten Rooda, Vandana, MD      ? hydrOXYzine (ATARAX) tablet 50 mg  50 mg Oral Q6H PRN Roselle LocusHill, Stephanie Leigh, MD   50 mg at 11/22/21 2031  ? ibuprofen (ADVIL) tablet 400 mg  400 mg Oral Q6H PRN [name], [name], NP      ? magnesium hydroxide (MILK OF MAGNESIA) suspension 30 mL  30 mL Oral Daily PRN Rankin, [name], NP      ? melatonin tablet 5 mg  5 mg Oral QHS Karsten Rooda, Vandana, MD   5 mg at 11/23/21 2121  ? propranolol (INDERAL) tablet 10 mg  10 mg Oral TID Rankin, [name], NP   10 mg at 11/24/21 [id number]  ? sertraline (ZOLOFT) tablet 25 mg  25 mg Oral Daily Karsten Rooda, Vandana, MD   25 mg at 11/24/21 [id number]  ? ?PTA Medications: ?Medications Prior to Admission  ?Medication Sig Dispense  Refill Last Dose  ? benztropine (COGENTIN) 0.5 MG tablet Take 1 tablet (0.5 mg total) by mouth 2 (two) times daily as needed for tremors. (Patient not taking: Reported on 11/06/2021) 45 tablet 0   ? carbamazepine (TEGRETOL) 100 MG chewable tablet Chew 1 tablet (100 mg total) by mouth 2 (two) times daily. (Patient not taking: Reported on 11/06/2021) 60 tablet 0   ? ibuprofen (ADVIL) 200 MG tablet Take 400-800 mg by mouth every 6 (six) hours as needed for moderate pain or headache.     ? loratadine (CLARITIN) 10 MG tablet Take 1 tablet (10 mg total) by mouth daily. 30 tablet 0   ? propranolol (INDERAL) 10 MG tablet Take 1 tablet (10 mg total) by mouth 3 (three) times daily. (Patient not taking: Reported on 11/06/2021) 90 tablet 0   ? risperiDONE (RISPERDAL M-TABS) 2 MG disintegrating tablet Take 1 tablet (2 mg total) by mouth at bedtime. (Patient not taking: Reported on 11/06/2021) 30 tablet 0   ? traZODone (DESYREL) 100 MG tablet Take 1 tablet (100 mg total) by mouth at bedtime as needed for sleep. (Patient not taking: Reported on 11/06/2021) 25 tablet 0   ? ? ?Patient Stressors: Financial difficulties   ?Marital  or family conflict   ?Medication change or noncompliance   ?Occupational concerns   ?Substance abuse   ? ?Patient Strengths: Capable of independent living  ?Communication skills  ?Physical Health  ? ?Treatment Modalities: Medication Management, Group therapy, Case management,  ?1 to 1 session with clinician, Psychoeducation, Recreational therapy. ? ? ?Physician Treatment Plan for Primary Diagnosis: Severe bipolar I disorder, current or most recent episode depressed (HCC) ?Long Term Goal(s): Improvement in symptoms so as ready for discharge  ? ?Short Term Goals: Ability to identify changes in lifestyle to reduce recurrence of condition will improve ?Ability to verbalize feelings will improve ?Ability to disclose and discuss suicidal ideas ?Ability to demonstrate self-control will improve ?Ability to identify and  develop effective coping behaviors will improve ?Ability to maintain clinical measurements within normal limits will improve ?Compliance with prescribed medications will improve ?Ability to identify triggers associated with substance abuse/mental health issues will improve ? ?Medication Management: Evaluate patient's response, side effects, and tolerance of medication regimen. ? ?Therapeutic Interventions: 1 to 1 sessions, Unit Group sessions and Medication administration. ? ?Evaluation of Outcomes: Adequate for Discharge ? ?Physician Treatment Plan for Secondary Diagnosis: Principal Problem: ?  Severe bipolar I disorder, current or most recent episode depressed (HCC) ?Active Problems: ?  PTSD (post-traumatic stress disorder) ?  Bipolar I, most recent episode mixed, severe with psychotic behavior (HCC) ?  Bipolar 1 disorder (HCC) ? ?Long Term Goal(s): Improvement in symptoms so as ready for discharge  ? ?Short Term Goals: Ability to identify changes in lifestyle to reduce recurrence of condition will improve ?Ability to verbalize feelings will improve ?Ability to disclose and discuss suicidal ideas ?Ability to demonstrate self-control will improve ?Ability to identify and develop effective coping behaviors will improve ?Ability to maintain clinical measurements within normal limits will improve ?Compliance with prescribed medications will improve ?Ability to identify triggers associated with substance abuse/mental health issues will improve    ? ?Medication Management: Evaluate patient's response, side effects, and tolerance of medication regimen. ? ?Therapeutic Interventions: 1 to 1 sessions, Unit Group sessions and Medication administration. ? ?Evaluation of Outcomes: Adequate for Discharge ? ? ?RN Treatment Plan for Primary Diagnosis: Severe bipolar I disorder, current or most recent episode depressed (HCC) ?Long Term Goal(s): Knowledge of disease and therapeutic regimen to maintain health will improve ? ?Short  Term Goals: Ability to remain free from injury will improve, Ability to participate in decision making will improve, Ability to verbalize feelings will improve, Ability to disclose and discuss suicidal ideas, and Ability to identify and develop effective coping behaviors will improve ? ?Medication Management: RN will administer medications as ordered by provider, will assess and evaluate patient's response and provide education to patient for prescribed medication. RN will report any adverse and/or side effects to prescribing provider. ? ?Therapeutic Interventions: 1 on 1 counseling sessions, Psychoeducation, Medication administration, Evaluate responses to treatment, Monitor vital signs and CBGs as ordered, Perform/monitor CIWA, COWS, AIMS and Fall Risk screenings as ordered, Perform wound care treatments as ordered. ? ?Evaluation of Outcomes: Adequate for Discharge ? ? ?LCSW Treatment Plan for Primary Diagnosis: Severe bipolar I disorder, current or most recent episode depressed (HCC) ?Long Term Goal(s): Safe transition to appropriate next level of care at discharge, Engage patient in therapeutic group addressing interpersonal concerns. ? ?Short Term Goals: Engage patient in aftercare planning with referrals and resources, Increase social support, Increase emotional regulation, Facilitate acceptance of mental health diagnosis and concerns, Identify triggers associated with mental health/substance abuse issues, and Increase skills  for wellness and recovery ? ?Therapeutic Interventions: Assess for all discharge needs, 1 to 1 time with Child psychotherapist, Explore available resources and support systems, Assess for adequacy in community support network, Educate family and significant other(s) on suicide prevention, Complete Psychosocial Assessment, Interpersonal group therapy. ? ?Evaluation of Outcomes: Adequate for Discharge ? ? ?Progress in Treatment: ?Attending groups: Yes. ?Participating in groups: Yes. ?Taking  medication as prescribed: Yes. ?Toleration medication: Yes. ?Family/Significant other contact made: No, will contact:  Declined Consents  ?Patient understands diagnosis: No. ?Discussing patient identified problems/goa

## 2021-11-24 NOTE — Progress Notes (Signed)
[facility] ? ?11/24/2021 11:58 AM ?Lindsey Prince  ?MRN:  [id number] ? ?Chief Complaint: paranoia ? ?Reason for Admission:  ?Lindsey Prince is a 36 y.o. female with past psychiatric history of bipolar 1 disorder, PTSD and history of psychosis, who [facility] with complaints of anxiety, auditory hallucinations, paranoia and mood lability. The patient is currently on Hospital Day 16.  ? ?Chart Review from last 24 hours:  ?The patient's chart was reviewed and nursing notes were reviewed. The patient's case was discussed in multidisciplinary team meeting.  Per nursing patient had no acute behavioral issues or safety concerns noted and attended groups this morning.  [facility] and did not require any  PRN's yesterday.   ? ?Information Obtained Today During Patient Interview: ?The patient was seen and evaluated on the unit.  She reports improvement in her mood and anxiety.  She denies paranoia.  She is able to do reality testing. She denies SI, HI, AVH, ideas of reference or first rank symptoms.  She states her sleep and appetite have been good.  She did not make any paranoid statements about staff or other patients.  Pt got LAI  Haldol yesterday and tolerated it well without any side effects. Discuss that we need to wait for VPA level to see Depakote is at therapeutic level. Pt is hoping to go home soon. Discussed that it is recommended to the patient to follow up with her outpatient psychiatric provider and PCP for vitals, weight, and regular blood work including CBC, CMP, blood glucose, liver function test, VPA level while on Depakote. Time was spent discussing the need to be on birth control while on VPA due to the potential teratogenic effects of this medication in pregnancy. Pt verbalizes understanding.  ? ?Principal Problem: Severe bipolar I disorder, current or most recent episode depressed (HCC) ?Diagnosis: Principal  Problem: ?  Severe bipolar I disorder, current or most recent episode depressed (HCC) ?Active Problems: ?  PTSD (post-traumatic stress disorder) ?  Bipolar I, most recent episode mixed, severe with psychotic behavior (HCC) ?  Bipolar 1 disorder (HCC) ? ?Total Time Spent in Direct Patient Care:  ?I personally spent 25 minutes on the unit in direct patient care. The direct patient care time included face-to-face time with the patient, reviewing the patient's chart, communicating with other professionals, and coordinating care. Greater than 50% of this time was spent in counseling or coordinating care with the patient regarding goals of hospitalization, psycho-education, and discharge planning needs. ? ?Past Psychiatric History: see H&P ? ?Past Medical History:  ?Past Medical History:  ?Diagnosis Date  ? Anemia   ? Herpes   ?  ?Past Surgical History:  ?Procedure Laterality Date  ? NO PAST SURGERIES    ? ?Family History:  ?Family History  ?Problem Relation Age of Onset  ? Healthy Neg Hx   ? ?Family Psychiatric  History: see H&P ? ?Social History:  ?Social History  ? ?Substance and Sexual Activity  ?Alcohol Use No  ?   ?Social History  ? ?Substance and Sexual Activity  ?Drug Use Yes  ? Types: Marijuana  ?  ?Social History  ? ?Socioeconomic History  ? Marital status: Single  ?  Spouse name: Not on file  ? Number of children: Not on file  ? Years of education: Not on file  ? Highest education level: Not on file  ?Occupational History  ? Not on file  ?Tobacco  Use  ? Smoking status: Every Day  ?  Packs/day: 1.50  ?  Types: Cigarettes  ? Smokeless tobacco: Never  ?Substance and Sexual Activity  ? Alcohol use: No  ? Drug use: Yes  ?  Types: Marijuana  ? Sexual activity: Not Currently  ?Other Topics Concern  ? Not on file  ?Social History Narrative  ? Not on file  ? ?Social Determinants of Health  ? ?Financial Resource Strain: Not on file  ?Food Insecurity: Not on file  ?Transportation Needs: Not on file  ?Physical Activity:  Not on file  ?Stress: Not on file  ?Social Connections: Not on file  ? ? ?Sleep: Good ? ?Appetite:  Good ? ?Current Medications: ?Current Facility-Administered Medications  ?Medication Dose Route Frequency Provider Last Rate Last Admin  ? acetaminophen (TYLENOL) tablet 650 mg  650 mg Oral Q6H PRN Rankin, [name], NP   650 mg at 11/21/21 0739  ? alum & mag hydroxide-simeth (MAALOX/MYLANTA) 200-200-20 MG/5ML suspension 30 mL  30 mL Oral Q4H PRN Rankin, [name], NP   30 mL at 11/18/21 1154  ? benztropine (COGENTIN) tablet 0.5 mg  0.5 mg Oral BID PRN Rankin, [name], NP      ? diclofenac Sodium (VOLTAREN) 1 % topical gel 2 g  2 g Topical QID Comer Locket, MD   2 g at 11/24/21 2376  ? divalproex (DEPAKOTE ER) 24 hr tablet 1,000 mg  1,000 mg Oral QHS Mason Jim, [name], MD   1,000 mg at 11/23/21 2121  ? haloperidol (HALDOL) tablet 5 mg  5 mg Oral BID Karsten Ro, MD      ? hydrOXYzine (ATARAX) tablet 50 mg  50 mg Oral Q6H PRN Roselle Locus, MD   50 mg at 11/22/21 2031  ? ibuprofen (ADVIL) tablet 400 mg  400 mg Oral Q6H PRN [name], [name], NP      ? magnesium hydroxide (MILK OF MAGNESIA) suspension 30 mL  30 mL Oral Daily PRN Rankin, [name], NP      ? melatonin tablet 5 mg  5 mg Oral QHS Karsten Ro, MD   5 mg at 11/23/21 2121  ? propranolol (INDERAL) tablet 10 mg  10 mg Oral TID Rankin, [name], NP   10 mg at 11/24/21 2831  ? sertraline (ZOLOFT) tablet 25 mg  25 mg Oral Daily Karsten Ro, MD   25 mg at 11/24/21 5176  ? ? ?Lab Results: No results found for this or any previous visit (from the past 48 hour(s)). ? ? ?Blood Alcohol level:  ?Lab Results  ?Component Value Date  ? ETH <10 11/06/2021  ? ETH <10 01/24/2021  ? ? ?Metabolic Disorder Labs: ?Lab Results  ?Component Value Date  ? HGBA1C 5.2 11/17/2021  ? MPG 103 11/17/2021  ? MPG 108.28 01/24/2021  ? ?No results found for: PROLACTIN ?Lab Results  ?Component Value Date  ? CHOL 171 11/17/2021  ? TRIG 172 (H) 11/17/2021  ? HDL 58 11/17/2021  ?  CHOLHDL 2.9 11/17/2021  ? VLDL 34 11/17/2021  ? LDLCALC 79 11/17/2021  ? LDLCALC 47 01/24/2021  ? ? ?Physical Findings: ?AIMS: Facial and Oral Movements ?Muscles of Facial Expression: None, normal ?Lips and Perioral Area: None, normal ?Jaw: None, normal ?Tongue: None, normal,Extremity Movements ?Upper (arms, wrists, hands, fingers): None, normal ?Lower (legs, knees, ankles, toes): None, normal, Trunk Movements ?Neck, shoulders, hips: None, normal, Overall Severity ?Severity of abnormal movements (highest score from questions above): None, normal ?Incapacitation due to  abnormal movements: None, normal ?Patient's awareness of abnormal movements (rate only patient's report): No Awareness, Dental Status ?Current problems with teeth and/or dentures?: No ?Does patient usually wear dentures?: No  ? ?Musculoskeletal: ?Strength & Muscle Tone: within normal limits ?Gait & Station: normal ?Patient leans: N/A ? ?Psychiatric Specialty Exam: ? ?Presentation  ?General Appearance: Wearing scrubs, adequate hygiene ? ?Eye Contact:Fair ? ?Speech:Clear and coherent, normal rate ? ?Speech Volume:Normal ? ?Mood and Affect  ?Mood:"good" ? ?Affect:constricted ? ? ?Thought Process  ?Thought Processes: linear ? ?Orientation:Full (Time, Place and Person) ? ?Thought Content: She denies AVH, ideas of reference or first rank symptoms. ;less paranoid today about ex, able to do reality testing.  Does not make paranoid/persecutory statement about staff; is not grossly responding to internal/external stimuli on exam ? ?Hallucinations:Denied ? ?Ideas of Reference:Denied ? ?Suicidal Thoughts:Denied ? ?Homicidal Thoughts:Denied ? ?Sensorium  ?Memory:Immediate Fair; Recent Fair; Remote Fair ? ?Judgment:Fair ? ?Insight:Fair ? ? ?Executive Functions  ?Concentration:Fair ? ?Attention Span:Fair ? ?Recall:Fair ? ?Fund of Knowledge:Fair ? ?Language:Good ? ? ?Psychomotor Activity  ?Psychomotor Activity:Normal ? ?Assets  ?Assets:Physical Health; Communication  Skills; Resilience ? ? ?Sleep  ?9.25 hours ? ? ?Physical Exam ?Vitals and nursing note reviewed.  ?HENT:  ?   Head: Normocephalic.  ?Pulmonary:  ?   Effort: Pulmonary effort is normal.  ?Neurological:  ?   Gene

## 2021-11-24 NOTE — Group Note (Signed)
LCSW Group Therapy Note ? ? ?Group Date: 11/24/2021 ?Start Time: 1300 ?End Time: 1400 ? ? ?Type of Therapy and Topic:  Group Therapy:  Wellness ? ? ?Participation Level: Active ? ? ?Description of Group: ?This group allows individuals to explore the 6 dimensions of wellness, including spiritual, emotional, intellectual, physical, social, environmental, financial and spiritual. Patients will learn to different ways to practice wellness to improve well-being. Patients also participated in a conversation about what wellness means to them.   Individuals will think about ways in which they currently practice wellness as well as ways they can improve their wellness and new ways to practice wellness.   ? ? ?  ?Therapeutic Goals ?Patient will verbalize 1 pr 2 we;;mess areas where they are doing well. ?Patient will identify 2 areas where they would like to improve their wellness.   ?Patient will provide a definition of what wellness means to them.  ?Patients will reflect on current hospitalization and primary areas to maintain mental health to prevent re-hospitalization.  ?  ? ?Summary of Patient Progress:  CSW provided this patient with packet and reviewed information with patient. Group was not held due to infectious disease precautions on unit.  ? ?  ?  ? ?Therapeutic Modalities ?Cognitive Behavioral Therapy ?Motivational Interviewing ? ? ?Charlise Giovanetti MSW, LCSW ?Clincal Social Worker  ?[facility]  ?

## 2021-11-25 LAB — VALPROIC ACID LEVEL: Valproic Acid Lvl: 42 ug/mL — ABNORMAL LOW (ref 50.0–100.0)

## 2021-11-25 MED ORDER — DICLOFENAC SODIUM 1 % EX GEL: 2.0000 g | Freq: Four times a day (QID) | CUTANEOUS | 0 refills | Status: AC

## 2021-11-25 MED ORDER — PROPRANOLOL HCL 10 MG PO TABS: 10.0000 mg | ORAL_TABLET | Freq: Three times a day (TID) | ORAL | 0 refills | Status: DC

## 2021-11-25 MED ORDER — HYDROXYZINE HCL 50 MG PO TABS: 50.0000 mg | ORAL_TABLET | Freq: Four times a day (QID) | ORAL | 0 refills | Status: DC | PRN

## 2021-11-25 MED ORDER — DIVALPROEX SODIUM ER 500 MG PO TB24: 1000.0000 mg | ORAL_TABLET | Freq: Every day | ORAL | 0 refills | Status: DC

## 2021-11-25 MED ORDER — HALOPERIDOL 5 MG PO TABS: 5.0000 mg | ORAL_TABLET | Freq: Two times a day (BID) | ORAL | 0 refills | Status: DC

## 2021-11-25 MED ORDER — HALOPERIDOL DECANOATE 50 MG/ML IM SOLN: 50.0000 mg | INTRAMUSCULAR | 0 refills | Status: DC

## 2021-11-25 MED ORDER — SERTRALINE HCL 25 MG PO TABS: 25.0000 mg | ORAL_TABLET | Freq: Every day | ORAL | 0 refills | Status: DC

## 2021-11-25 MED ORDER — MELATONIN 5 MG PO TABS: 5.0000 mg | ORAL_TABLET | Freq: Every day | ORAL | 0 refills | Status: DC

## 2021-11-25 MED ORDER — BENZTROPINE MESYLATE 0.5 MG PO TABS: 0.5000 mg | ORAL_TABLET | Freq: Two times a day (BID) | ORAL | 0 refills | Status: DC | PRN

## 2021-11-25 NOTE — Progress Notes (Signed)
?  [facility] Adult Case Management Discharge Plan : ? ?Will you be returning to the same living situation after discharge:  No. Will be staying with friend ?At discharge, do you have transportation home?: Yes,  friend to pick her up ?Do you have the ability to pay for your medications: No. Samples to be provided ? ?Release of information consent forms completed and in the chart;  Patient's signature needed at discharge. ? ?Patient to Follow up at: ? Follow-up Information   ? ? [facility]. Go to.   ?Specialty: Behavioral Health ?Why: Please go to this provider for therapy and medication management services during walk in hours:  Monday through Wednesday from 7:30 am to 10:30 am.  Services are provided on a first come, first served, limited basis, so please arrive early. ?Contact information: ?[address] ?[place] [address] ?[phone] ? ?  ?  ? ?  ?  ? ?  ? ? ?Next level of care provider has access to [facility] Link:yes ? ?Safety Planning and Suicide Prevention discussed: Yes,  with patient ? ?  ? ?Has patient been referred to the Quitline?: Yes, faxed on 11/25/2021 ? ?Patient has been referred for addiction treatment: Yes. Can receive substance use counseling through [facility]. ? ? ?Otelia Santee, LCSW ?11/25/2021, 9:38 AM ?

## 2021-11-25 NOTE — Group Note (Signed)
Recreation Therapy Group Note ? ? ?Group Topic:Health and Wellness  ?Group Date: 11/25/2021 ?Start Time: 1000 ?End Time: 1045 ?Facilitators: Caroll Rancher, LRT,CTRS ?Location: [address] Dayroom ? ? ?Goal Area(s) Addresses:  ?Patient will define components of whole wellness. ?Patient will verbalize benefit of whole wellness. ? ?Group Description:  LRT gave patients that focused on gaining mental clarity through journaling and find peace of mind.  Patients were to take their time to really focus on the ways they could release some of the mental strain we tend to put on ourselves. ? ? ?Participation Level: Group not held ?  ? ?Clinical Observations/Individualized Feedback: Due to illnesses on the unit, group was not held at usual time.  Patients were given packets that focused on journaling and finding peace.   ? ? ?Plan: Continue to engage patient in RT group sessions 2-3x/week. ? ? ?Caroll Rancher, LRT,CTRS ?11/25/2021 10:39 AM ?

## 2021-11-25 NOTE — Discharge Summary (Signed)
Physician Discharge Summary Note ? ?Patient:  Lindsey Prince is an 36 y.o., female ?MRN:  [id number] ?DOB:  July 23, 1986 ?Patient phone:  [phone] (home)  ?Patient address:   ?Homeless ?[place] [address],  ?Total Time spent with patient: 30 minutes ? ?Date of Admission:  11/08/2021 ?Date of Discharge: 11/25/21 ? ?Reason for Admission:   Patient is a 36 year old female with past psychiatric history of bipolar 1 disorder, PTSD and history of psychosis who initially presented unaccompanied to [facility], ED with complaints of anxiety, auditory hallucinations, paranoia and mood lability.   ? ?Principal Problem: Severe bipolar I disorder, current or most recent episode depressed (HCC) ?Discharge Diagnoses: Principal Problem: ?  Severe bipolar I disorder, current or most recent episode depressed (HCC) ?Active Problems: ?  PTSD (post-traumatic stress disorder) ?  Bipolar I, most recent episode mixed, severe with psychotic behavior (HCC) ?  Bipolar 1 disorder (HCC) ? ?Past Psychiatric History: : h/o Bipolar 1 ? ?Past Medical History:  ?Past Medical History:  ?Diagnosis Date  ? Anemia   ? Herpes   ?  ?Past Surgical History:  ?Procedure Laterality Date  ? NO PAST SURGERIES    ? ?Family History:  ?Family History  ?Problem Relation Age of Onset  ? Healthy Neg Hx   ? ?Family Psychiatric  History: Father alcoholic, died due to cirrhosis of liver in 2006 ?Social History:  ?Social History  ? ?Substance and Sexual Activity  ?Alcohol Use No  ?   ?Social History  ? ?Substance and Sexual Activity  ?Drug Use Yes  ? Types: Marijuana  ?  ?Social History  ? ?Socioeconomic History  ? Marital status: Single  ?  Spouse name: Not on file  ? Number of children: Not on file  ? Years of education: Not on file  ? Highest education level: Not on file  ?Occupational History  ? Not on file  ?Tobacco Use  ? Smoking status: Every Day  ?  Packs/day: 1.50  ?  Types: Cigarettes  ? Smokeless tobacco: Never  ?Substance and Sexual Activity  ? Alcohol use:  No  ? Drug use: Yes  ?  Types: Marijuana  ? Sexual activity: Not Currently  ?Other Topics Concern  ? Not on file  ?Social History Narrative  ? Not on file  ? ?Social Determinants of Health  ? ?Financial Resource Strain: Not on file  ?Food Insecurity: Not on file  ?Transportation Needs: Not on file  ?Physical Activity: Not on file  ?Stress: Not on file  ?Social Connections: Not on file  ? ? ?Hospital Course:  HOSPITAL COURSE: ? ?During the patient's hospitalization, patient had extensive initial psychiatric evaluation, and follow-up psychiatric evaluations every day. ? ?Psychiatric diagnoses provided upon initial assessment:  ?Bipolar 1, current episode depressed, severe with psychotic features ?PTSD ?Marijuana abuse ? ?Patient's psychiatric medications were adjusted on admission: Bipolar 1, current episode depressed, severe with psychotic features ?PTSD ?-Restarted on benztropine 0.5 mg twice daily ?-Restarted on Tegretol 100 mg twice daily ?-Restarted on propanolol 10 mg 3 times daily ?-Restarted on risperidone M tabs 2 mg nightly ?During the hospitalization, other adjustments were made to the patient's psychiatric medication regimen:  ?--Started on benztropine 0.5 mg twice daily PRN ?--Tegretol tapered off and stopped due to ineffectiveness   ?--Continued on propanolol 10 mg 3 times daily for anxiety ?--Started and titrated on VPA ER to 1000 mg daily at bedtime.  Last VPA level on 11/25/2021- ?-- Haldol started and titrated to 5 mg twice daily for residual  paranoid delusions and irritability . ?-Pt received Haldol decanoate IM 50 mg yesterday. Pt need to get it every month. ?--Zyprexa started and tapered and stopped due to being ineffective. ?-- Continued Zoloft 25mg  for PTSD/ruminations ?           ?Patient's care was discussed during the interdisciplinary team meeting every day during the hospitalization. ? ?The patient denies having side effects to prescribed psychiatric medication. ? ?Gradually, patient started  adjusting to milieu. The patient was evaluated each day by a clinical provider to ascertain response to treatment. Improvement was noted by the patient's report of decreasing symptoms, improved sleep and appetite, affect, paranoid thoughts, medication tolerance, behavior, and participation in unit programming.  Patient was asked each day to complete a self inventory noting mood, mental status, pain, new symptoms, anxiety and concerns.   ?Symptoms were reported as significantly decreased or resolved completely by discharge.  ?The patient reports that their mood is stable.  ?The patient denied having suicidal thoughts for more than 48 hours prior to discharge.  Patient denies having homicidal thoughts.  Patient denies having auditory hallucinations.  Patient denies any visual hallucinations or other symptoms of psychosis.  ?The patient was motivated to continue taking medication with a goal of continued improvement in mental health.  ? ?The patient reports their target psychiatric symptoms of bipolar disorder responded well to the psychiatric medications, and the patient reports overall benefit other psychiatric hospitalization. Supportive psychotherapy was provided to the patient. The patient also participated in regular group therapy while hospitalized. Coping skills, problem solving as well as relaxation therapies were also part of the unit programming. ? ?Labs were reviewed with the patient, and abnormal results were discussed with the patient. High triglycerides 172, urinalysis positive for trichomonas and bacteria.  Patient received 7 days of metronidazole. ? ?The patient is able to verbalize their individual safety plan to this provider. ? ?# It is recommended to the patient to continue psychiatric medications as prescribed, after discharge from the hospital.   ? ?# It is recommended to the patient to follow up with your outpatient psychiatric provider and PCP. ? ?# It was discussed with the patient, the impact  of alcohol, drugs, tobacco have been there overall psychiatric and medical wellbeing, and total abstinence from substance use was recommended the patient.ed. ? ?# Prescriptions provided or sent directly to preferred pharmacy at discharge. Patient agreeable to plan. Given opportunity to ask questions. Appears to feel comfortable with discharge.  ?  ?# In the event of worsening symptoms, the patient is instructed to call the crisis hotline, 911 and or go to the nearest ED for appropriate evaluation and treatment of symptoms. To follow-up with primary care provider for other medical issues, concerns and or health care needs. ? ?#-Patient seen by this MD. At time of discharge, consistently refuted any suicidal ideation, intention or plan, denies any Self harm urges. Denies any A/VH and no delusions were elicited and does not seem to be responding to internal stimuli. During assessment the patient is able to verbalize appropriated coping skills and safety plan to use on return home. Patient verbalizes intent to be compliant with medication and outpatient services.  ?  ?-The risks of potentially developing TD/EPS, weight gain, blood dyscrasias, EKG changes, DM, and high cholesterol while taking an antipsychotic were discussed in detail with the patient.  The patient was made aware of the need for ongoing lab, AIMS, EKG, and weight monitoring with use of an antipsychotic.   ?  ?--  It is recommended to the patient to follow up with your outpatient psychiatric provider and PCP for vitals, weight, and regular blood work including CBC, CMP, blood glucose, liver function test, VPA level while on Depakote. Time was spent discussing the need to be on birth control while on VPA due to the potential teratogenic effects of this medication in pregnancy. ? ?# Patient was discharged home with a plan to follow up as noted below.  ? ?Physical Findings: ?AIMS: Facial and Oral Movements ?Muscles of Facial Expression: None, normal ?Lips and  Perioral Area: None, normal ?Jaw: None, normal ?Tongue: None, normal,Extremity Movements ?Upper (arms, wrists, hands, fingers): None, normal ?Lower (legs, knees, ankles, toes): None, normal, Trunk Mo

## 2021-11-25 NOTE — Progress Notes (Signed)
D: Pt A & O X 3. Denies SI, HI, AVH and pain at this time. D/C home as ordered. Picked up in front of facility by her daughter's father.  ?A: D/C instructions reviewed with pt including prescriptions, medication samples and follow up appointment, compliance encouraged. All belongings from locker 16 given to pt at time of departure. Scheduled medications given with verbal education and effects monitored. Safety checks maintained without incident till time of d/c.  ?R: Pt receptive to care. Compliant with medications when offered. Denies adverse drug reactions when assessed. Verbalized understanding related to d/c instructions. Signed belonging sheet in agreement with items received from locker. Ambulatory with a steady gait. Appears to be in no physical distress at time of departure.  ? ?
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
## 2021-11-29 ENCOUNTER — Other Ambulatory Visit: Payer: Self-pay

## 2021-11-29 ENCOUNTER — Ambulatory Visit (INDEPENDENT_AMBULATORY_CARE_PROVIDER_SITE_OTHER): Payer: No Payment, Other | Admitting: Psychiatry

## 2021-11-29 ENCOUNTER — Encounter (HOSPITAL_COMMUNITY): Payer: Self-pay | Admitting: Psychiatry

## 2021-11-29 VITALS — BP 103/83 | HR 62 | Ht 62.0 in | Wt 143.0 lb

## 2021-11-29 DIAGNOSIS — F3164 Bipolar disorder, current episode mixed, severe, with psychotic features: Secondary | ICD-10-CM | POA: Diagnosis not present

## 2021-11-29 DIAGNOSIS — F4310 Post-traumatic stress disorder, unspecified: Secondary | ICD-10-CM | POA: Diagnosis not present

## 2021-11-29 MED ORDER — HALOPERIDOL DECANOATE 50 MG/ML IM SOLN
50.0000 mg | INTRAMUSCULAR | 1 refills | Status: DC
  Filled 2021-11-29: qty 1, 28d supply, fill #0

## 2021-11-29 MED ORDER — MELATONIN 5 MG PO TABS: 5.0000 mg | ORAL_TABLET | Freq: Every day | ORAL | 1 refills | Status: AC

## 2021-11-29 MED ORDER — PROPRANOLOL HCL 10 MG PO TABS
10.0000 mg | ORAL_TABLET | Freq: Three times a day (TID) | ORAL | 1 refills | Status: DC
  Filled 2021-11-29: qty 90, 30d supply, fill #0

## 2021-11-29 MED ORDER — SERTRALINE HCL 25 MG PO TABS
25.0000 mg | ORAL_TABLET | Freq: Every day | ORAL | 1 refills | Status: DC
  Filled 2021-11-29: qty 30, 30d supply, fill #0

## 2021-11-29 MED ORDER — HALOPERIDOL 5 MG PO TABS
5.0000 mg | ORAL_TABLET | Freq: Two times a day (BID) | ORAL | 1 refills | Status: DC
  Filled 2021-11-29: qty 60, 30d supply, fill #0

## 2021-11-29 MED ORDER — HYDROXYZINE HCL 25 MG PO TABS
25.0000 mg | ORAL_TABLET | Freq: Four times a day (QID) | ORAL | 1 refills | Status: DC | PRN
Start: 2021-11-29 — End: 2023-01-12
  Filled 2021-11-29: qty 30, 8d supply, fill #0

## 2021-11-29 MED ORDER — DIVALPROEX SODIUM ER 500 MG PO TB24
1000.0000 mg | ORAL_TABLET | Freq: Every day | ORAL | 1 refills | Status: DC
  Filled 2021-11-29: qty 60, 30d supply, fill #0
  Filled 2022-01-07: qty 60, 30d supply, fill #1

## 2021-11-29 MED ORDER — BENZTROPINE MESYLATE 0.5 MG PO TABS
0.5000 mg | ORAL_TABLET | Freq: Two times a day (BID) | ORAL | 1 refills | Status: DC | PRN
  Filled 2021-11-29: qty 60, 30d supply, fill #0
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
## 2021-12-03 ENCOUNTER — Other Ambulatory Visit: Payer: Self-pay

## 2021-12-21 ENCOUNTER — Other Ambulatory Visit: Payer: Self-pay

## 2021-12-21 ENCOUNTER — Telehealth (HOSPITAL_COMMUNITY): Payer: Self-pay | Admitting: Psychiatry

## 2021-12-21 NOTE — Telephone Encounter (Signed)
Per [facility], HALDOL injection is not available for $10 and they are not able to fill it for uninsured.  Are we able to do a patient assistance application with the pharmaceutical company?    Pt does have pills she can take until we get an approval.

## 2021-12-28 ENCOUNTER — Other Ambulatory Visit: Payer: Self-pay

## 2021-12-28 ENCOUNTER — Other Ambulatory Visit (HOSPITAL_COMMUNITY): Payer: Self-pay | Admitting: Family

## 2021-12-28 ENCOUNTER — Ambulatory Visit (INDEPENDENT_AMBULATORY_CARE_PROVIDER_SITE_OTHER): Payer: No Payment, Other | Admitting: *Deleted

## 2021-12-28 ENCOUNTER — Encounter (HOSPITAL_COMMUNITY): Payer: Self-pay

## 2021-12-28 DIAGNOSIS — F319 Bipolar disorder, unspecified: Secondary | ICD-10-CM | POA: Diagnosis not present

## 2021-12-28 MED ORDER — HALOPERIDOL DECANOATE 50 MG/ML IM SOLN
50.0000 mg | INTRAMUSCULAR | Status: AC
  Administered 2021-12-28: 50 mg via INTRAMUSCULAR

## 2021-12-28 MED ORDER — SERTRALINE HCL 25 MG PO TABS
25.0000 mg | ORAL_TABLET | Freq: Every day | ORAL | 0 refills | Status: DC
  Filled 2021-12-28 – 2022-01-07 (×2): qty 30, 30d supply, fill #0

## 2021-12-28 NOTE — Telephone Encounter (Cosign Needed)
Patient reports she needs refill of sertraline. ?

## 2021-12-28 NOTE — Progress Notes (Signed)
In for her first injection with this clinic. She was last hospitalized two months ago at [facility]. ?She has been taking oral haldol and now is taking IM. Consulted with Doran Heater NP and told she can stop taking the oral haldol and its now being replaced with Haldol D IM. She reports she is stable and denies any sx of psychosis. She requested a new rx for her zoloft and it was sent into her preferred pharmacy. Discussed with her that the patient assistance form we completed is no good as the company making Haldol is no longer doing patient assistance for new patients. At her next visit will have her see a provider and discuss possible future change to her LAI. For now we have samples of Haldol D available for her and she is uninsured. She is to return in one month for her next shot.  ?
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
## 2022-01-04 ENCOUNTER — Other Ambulatory Visit: Payer: Self-pay

## 2022-01-07 ENCOUNTER — Other Ambulatory Visit: Payer: Self-pay

## 2022-01-07 ENCOUNTER — Other Ambulatory Visit (HOSPITAL_COMMUNITY): Payer: Self-pay | Admitting: Physician Assistant

## 2022-01-07 DIAGNOSIS — F3164 Bipolar disorder, current episode mixed, severe, with psychotic features: Secondary | ICD-10-CM

## 2022-01-07 MED ORDER — DIVALPROEX SODIUM ER 500 MG PO TB24: 1000.0000 mg | ORAL_TABLET | Freq: Every day | ORAL | 2 refills | Status: DC

## 2022-01-07 NOTE — Progress Notes (Signed)
Provider was contacted by Orpah Clinton. Reola Calkins, RN regarding refill request for this patient.  Patient is needing her Depakote to be refilled.  Patient's medication to be filled and e-prescribed to pharmacy of choice. ?

## 2022-01-10 ENCOUNTER — Encounter (HOSPITAL_COMMUNITY): Payer: No Payment, Other | Admitting: Psychiatry

## 2022-01-10 ENCOUNTER — Other Ambulatory Visit: Payer: Self-pay

## 2022-01-26 ENCOUNTER — Ambulatory Visit (HOSPITAL_COMMUNITY): Payer: No Payment, Other | Admitting: Licensed Clinical Social Worker

## 2022-01-27 ENCOUNTER — Ambulatory Visit (HOSPITAL_COMMUNITY): Payer: No Payment, Other

## 2022-02-03 ENCOUNTER — Ambulatory Visit (HOSPITAL_COMMUNITY): Payer: No Payment, Other

## 2022-02-07 ENCOUNTER — Telehealth (HOSPITAL_COMMUNITY): Payer: Self-pay | Admitting: *Deleted

## 2022-02-07 NOTE — Telephone Encounter (Signed)
PATIENT CALLED TO GET REFILL ON HER MEDICATIONS. PATIENT WAS INFORMED THAT SHE HAS NO SHOWED & CANCELLED HER APPOINTMENTS WITH PROVIDER & WILL REQUIRE A APPOINTMENT BEFORE MEDICATION REFILLS GIVEN

## 2022-02-11 ENCOUNTER — Encounter (HOSPITAL_COMMUNITY): Payer: Self-pay

## 2022-02-11 ENCOUNTER — Telehealth (HOSPITAL_COMMUNITY): Payer: No Payment, Other | Admitting: Psychiatry

## 2022-02-15 ENCOUNTER — Ambulatory Visit (HOSPITAL_COMMUNITY): Payer: No Payment, Other

## 2022-03-22 ENCOUNTER — Ambulatory Visit (HOSPITAL_COMMUNITY): Payer: No Payment, Other | Admitting: Licensed Clinical Social Worker

## 2023-01-06 ENCOUNTER — Other Ambulatory Visit: Payer: Self-pay

## 2023-01-06 ENCOUNTER — Encounter (HOSPITAL_COMMUNITY): Payer: Self-pay | Admitting: Psychiatry

## 2023-01-06 ENCOUNTER — Ambulatory Visit (HOSPITAL_COMMUNITY)
Admission: EM | Admit: 2023-01-06 | Discharge: 2023-01-06 | Disposition: A | Discharge status: Other Acute Inpatient Hospital | Payer: Self-pay | Attending: Family | Admitting: Family

## 2023-01-06 ENCOUNTER — Inpatient Hospital Stay (HOSPITAL_COMMUNITY)
Admission: AD | Admit: 2023-01-06 | Discharge: 2023-01-12 | DRG: 885 | Disposition: A | Discharge status: Home / Self Care | Payer: No Typology Code available for payment source | Source: Intra-hospital | Attending: Psychiatry | Admitting: Psychiatry

## 2023-01-06 DIAGNOSIS — K5900 Constipation, unspecified: Secondary | ICD-10-CM | POA: Diagnosis present

## 2023-01-06 DIAGNOSIS — F22 Delusional disorders: Secondary | ICD-10-CM | POA: Insufficient documentation

## 2023-01-06 DIAGNOSIS — Z91199 Patient's noncompliance with other medical treatment and regimen due to unspecified reason: Secondary | ICD-10-CM | POA: Diagnosis not present

## 2023-01-06 DIAGNOSIS — R45851 Suicidal ideations: Secondary | ICD-10-CM | POA: Diagnosis present

## 2023-01-06 DIAGNOSIS — F4310 Post-traumatic stress disorder, unspecified: Secondary | ICD-10-CM | POA: Diagnosis present

## 2023-01-06 DIAGNOSIS — Z560 Unemployment, unspecified: Secondary | ICD-10-CM | POA: Diagnosis not present

## 2023-01-06 DIAGNOSIS — G4700 Insomnia, unspecified: Secondary | ICD-10-CM | POA: Diagnosis present

## 2023-01-06 DIAGNOSIS — K30 Functional dyspepsia: Secondary | ICD-10-CM | POA: Diagnosis present

## 2023-01-06 DIAGNOSIS — Z79899 Other long term (current) drug therapy: Secondary | ICD-10-CM

## 2023-01-06 DIAGNOSIS — F319 Bipolar disorder, unspecified: Secondary | ICD-10-CM | POA: Insufficient documentation

## 2023-01-06 DIAGNOSIS — Z1152 Encounter for screening for COVID-19: Secondary | ICD-10-CM | POA: Insufficient documentation

## 2023-01-06 DIAGNOSIS — F17200 Nicotine dependence, unspecified, uncomplicated: Secondary | ICD-10-CM | POA: Diagnosis present

## 2023-01-06 DIAGNOSIS — Z818 Family history of other mental and behavioral disorders: Secondary | ICD-10-CM | POA: Diagnosis not present

## 2023-01-06 DIAGNOSIS — F3164 Bipolar disorder, current episode mixed, severe, with psychotic features: Secondary | ICD-10-CM | POA: Diagnosis present

## 2023-01-06 DIAGNOSIS — R440 Auditory hallucinations: Secondary | ICD-10-CM | POA: Diagnosis present

## 2023-01-06 LAB — CBC WITH DIFFERENTIAL/PLATELET
Abs Immature Granulocytes: 0.03 10*3/uL (ref 0.00–0.07)
Basophils Absolute: 0.1 10*3/uL (ref 0.0–0.1)
Basophils Relative: 1 %
Eosinophils Absolute: 0 10*3/uL (ref 0.0–0.5)
Eosinophils Relative: 0 %
HCT: 37.6 % (ref 36.0–46.0)
Hemoglobin: 13.5 g/dL (ref 12.0–15.0)
Immature Granulocytes: 0 %
Lymphocytes Relative: 29 %
Lymphs Abs: 2.2 10*3/uL (ref 0.7–4.0)
MCH: 32 pg (ref 26.0–34.0)
MCHC: 35.9 g/dL (ref 30.0–36.0)
MCV: 89.1 fL (ref 80.0–100.0)
Monocytes Absolute: 0.5 10*3/uL (ref 0.1–1.0)
Monocytes Relative: 6 %
Neutro Abs: 4.9 10*3/uL (ref 1.7–7.7)
Neutrophils Relative %: 64 %
Platelets: 444 10*3/uL — ABNORMAL HIGH (ref 150–400)
RBC: 4.22 MIL/uL (ref 3.87–5.11)
RDW: 12 % (ref 11.5–15.5)
WBC: 7.8 10*3/uL (ref 4.0–10.5)
nRBC: 0 % (ref 0.0–0.2)

## 2023-01-06 LAB — POCT URINE DRUG SCREEN - MANUAL ENTRY (I-SCREEN)
POC Amphetamine UR: NOT DETECTED
POC Buprenorphine (BUP): NOT DETECTED
POC Cocaine UR: NOT DETECTED
POC Marijuana UR: NOT DETECTED
POC Methadone UR: NOT DETECTED
POC Methamphetamine UR: NOT DETECTED
POC Morphine: NOT DETECTED
POC Oxazepam (BZO): NOT DETECTED
POC Oxycodone UR: NOT DETECTED
POC Secobarbital (BAR): NOT DETECTED

## 2023-01-06 LAB — COMPREHENSIVE METABOLIC PANEL
ALT: 23 U/L (ref 0–44)
AST: 21 U/L (ref 15–41)
Albumin: 4.2 g/dL (ref 3.5–5.0)
Alkaline Phosphatase: 61 U/L (ref 38–126)
Anion gap: 11 (ref 5–15)
BUN: 6 mg/dL (ref 6–20)
CO2: 22 mmol/L (ref 22–32)
Calcium: 9.3 mg/dL (ref 8.9–10.3)
Chloride: 104 mmol/L (ref 98–111)
Creatinine, Ser: 0.75 mg/dL (ref 0.44–1.00)
GFR, Estimated: >60 mL/min (ref >60)
Glucose, Bld: 120 mg/dL — ABNORMAL HIGH (ref 70–99)
Potassium: 3.5 mmol/L (ref 3.5–5.1)
Sodium: 137 mmol/L (ref 135–145)
Total Bilirubin: 0.7 mg/dL (ref 0.3–1.2)
Total Protein: 6.9 g/dL (ref 6.5–8.1)

## 2023-01-06 LAB — TSH: TSH: 1.411 u[IU]/mL (ref 0.350–4.500)

## 2023-01-06 LAB — POCT PREGNANCY, URINE: Preg Test, Ur: NEGATIVE

## 2023-01-06 LAB — SARS CORONAVIRUS 2 BY RT PCR: SARS Coronavirus 2 by RT PCR: NEGATIVE

## 2023-01-06 MED ORDER — LORAZEPAM 1 MG PO TABS
1.0000 mg | ORAL_TABLET | Freq: Two times a day (BID) | ORAL | Status: DC
  Administered 2023-01-06: 1 mg via ORAL

## 2023-01-06 MED ORDER — LORAZEPAM 1 MG PO TABS
1.0000 mg | ORAL_TABLET | Freq: Two times a day (BID) | ORAL | Status: DC
  Filled 2023-01-06: qty 1

## 2023-01-06 MED ORDER — ALUM & MAG HYDROXIDE-SIMETH 200-200-20 MG/5ML PO SUSP: 30.0000 mL | ORAL | Status: DC | PRN

## 2023-01-06 MED ORDER — MAGNESIUM HYDROXIDE 400 MG/5ML PO SUSP: 30.0000 mL | Freq: Every day | ORAL | Status: DC | PRN

## 2023-01-06 MED ORDER — TRAZODONE HCL 50 MG PO TABS: 50.0000 mg | ORAL_TABLET | Freq: Every evening | ORAL | Status: DC | PRN

## 2023-01-06 MED ORDER — MAGNESIUM HYDROXIDE 400 MG/5ML PO SUSP
30.0000 mL | Freq: Every day | ORAL | Status: DC | PRN
  Filled 2023-01-06: qty 30

## 2023-01-06 MED ORDER — TRAZODONE HCL 50 MG PO TABS
50.0000 mg | ORAL_TABLET | Freq: Every evening | ORAL | Status: DC | PRN
  Administered 2023-01-06 – 2023-01-07 (×2): 50 mg via ORAL
  Filled 2023-01-06 (×2): qty 1

## 2023-01-06 MED ORDER — HALOPERIDOL 5 MG PO TABS
5.0000 mg | ORAL_TABLET | Freq: Two times a day (BID) | ORAL | Status: DC
  Administered 2023-01-06: 5 mg via ORAL
  Filled 2023-01-06: qty 1

## 2023-01-06 MED ORDER — DIVALPROEX SODIUM 250 MG PO DR TAB
250.0000 mg | DELAYED_RELEASE_TABLET | Freq: Two times a day (BID) | ORAL | Status: DC
  Administered 2023-01-06: 250 mg via ORAL
  Filled 2023-01-06: qty 1

## 2023-01-06 MED ORDER — LORAZEPAM 2 MG/ML IJ SOLN: 2.0000 mg | Freq: Three times a day (TID) | INTRAMUSCULAR | Status: DC | PRN

## 2023-01-06 MED ORDER — LORAZEPAM 1 MG PO TABS
2.0000 mg | ORAL_TABLET | Freq: Three times a day (TID) | ORAL | Status: DC | PRN
  Administered 2023-01-06: 2 mg via ORAL
  Filled 2023-01-06: qty 2

## 2023-01-06 NOTE — Tx Team (Signed)
Initial Treatment Plan 01/06/2023 6:10 PM Lindsey Prince [id number]    PATIENT STRESSORS: Marital or family conflict     PATIENT STRENGTHS: Physical Health    PATIENT IDENTIFIED PROBLEMS: Relationship conflict                     DISCHARGE CRITERIA:  Ability to meet basic life and health needs  PRELIMINARY DISCHARGE PLAN: Return to previous work or school arrangements  PATIENT/FAMILY INVOLVEMENT: This treatment plan has been presented to and reviewed with the patient, Lindsey Prince, and/or family member.The patient and family have been given the opportunity to ask questions and make suggestions.  Mathews Argyle, RN 01/06/2023, 6:10 PM
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
## 2023-01-06 NOTE — Discharge Instructions (Addendum)
Patient accpeted to [facility]- [facility] unit

## 2023-01-06 NOTE — BHH Group Notes (Signed)
Adult Psychoeducational Group Note  Date:  01/06/2023 Time:  9:40 PM  Group Topic/Focus:  Recovery Goals:   The focus of this group is to identify appropriate goals for recovery and establish a plan to achieve them.  Participation Level:  Did Not Attend  Participation Quality:    Affect:    Cognitive:    Insight:   Engagement in Group:    Modes of Intervention:    Additional Comments:     Lindsey Prince A Kimberli Winne-McCall 01/06/2023, 9:40 PM

## 2023-01-06 NOTE — Care Management (Signed)
OBS Care Management    Per NP the patient meets criteria for inpatient psychiatric hospitalization.  Patient present to the Crisis Center with her 37-year old daughter.  Writer left a HIPAA compliant voice mail message for the 15-year old daughter's father [name] Currie)at [phone]).  The patient mother reports that she does not have any other family or friend supports outside of her daughter's father.   Per chart review in epic there are no other phone numbers listed.  Writer contacted the patient's school [employer] and spoke to [place]) and requested to see if the school social worker is able to get in contact with the patients father or to determine if there is another emergency contact for the patient's emergency contact.   Per the school social worker the paternal grandmother Denyce Robert [phone]) is the emergency contact.   Writer spoke to the paternal grandmother.  The paternal grandmother reports that the minor's father works third shift and he does not have his phone on when he is sleeping so that he can rest for his upcoming shift.   The paternal grandmother reports that she is able to come and pick up the 64-year old minor.  Writer spoke to the minor's father and he reports that his mother Windell Norfolk has his permission to pick up the patient.

## 2023-01-06 NOTE — Progress Notes (Signed)
Received Lindsey Prince in the assessment room, she was medicated per order with Ativan 1 mg without incident. She was tearful upon this writers arrival.

## 2023-01-06 NOTE — Progress Notes (Signed)
Pt was accepted to [facility] TODAY 01/06/2023, pending labs and signed voluntary consent faxed to [phone]. Bed assignment: 404-2  Pt meets inpatient criteria per Hillery Jacks, NP  Attending Physician will be Phineas Inches, MD  Report can be called to: - Adult unit: [phone]  Pt can arrive after pending items are received; [facility] AC to coordinate arrival time  Care Team Notified: [facility] Malva Limes, RN, Roddie Mc, RN, and Hillery Jacks, NP  Cathie Beams, LCSWA  01/06/2023 12:13 PM

## 2023-01-06 NOTE — Progress Notes (Signed)
   01/06/23 0920  BHUC Triage Screening (Walk-ins at [facility] only)  How Did You Hear About [place]? Self  What Is the Reason for Your Visit/Call Today? Pt presents to [facility] voluntarily accompanied by her 37yo daughter due to altered mental status. Pt reports auditory hallucinations, and increased anxiety. Pt appears to be very anxious and has delusional thought processing at the moment. Pt reports she feels like her daughter also has mental health concerns but cannot elaborate at this time. Pts daughter is pleasant, cooperative and appears to be oriented at the moment.Pt states she needs help. Pt denies SI/HI, visual hallucinations and drug/alcohol use at this time  How Long Has This Been Causing You Problems? <Week  Have You Recently Had Any Thoughts About Hurting Yourself? No  Are You Planning to Commit Suicide/Harm Yourself At This time? No  Have you Recently Had Thoughts About Hurting Someone Karolee Ohs? No  Are You Planning To Harm Someone At This Time? No  Are you currently experiencing any auditory, visual or other hallucinations? Yes  Please explain the hallucinations you are currently experiencing: today, unable to elaborate  Have You Used Any Alcohol or Drugs in the Past 24 Hours? No  Do you have any current medical co-morbidities that require immediate attention? No  Clinician description of patient physical appearance/behavior: anxious, guarded, casually dressed, shaking  What Do You Feel Would Help You the Most Today? Treatment for Depression or other mood problem  If access to [facility] was not available, would you have sought care in the Emergency Department? No  Determination of Need Emergent (2 hours)  Options For Referral Outpatient Therapy;Medication Management
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
## 2023-01-06 NOTE — ED Notes (Signed)
Tanyika was served and transported to [facility] via [employer]. She received her personal belongings and the necessary paperwork was transported to [facility].

## 2023-01-06 NOTE — Progress Notes (Signed)
Report was called to nurse Cassandra at [facility]. This Clinical research associate made a recall to inform her the patient is IVC. The non emergent [employer] was notified to serve the patient and transport.

## 2023-01-06 NOTE — Progress Notes (Signed)
   01/06/23 2251  Psych Admission Type (Psych Patients Only)  Admission Status Involuntary  Psychosocial Assessment  Patient Complaints Agitation;Anxiety;Irritability;Depression  Eye Contact Fair  Facial Expression Anxious;Animated  Affect Appropriate to circumstance  Speech Logical/coherent  Interaction Guarded;Minimal  Motor Activity Other (Comment) (WDL)  Appearance/Hygiene Unremarkable  Behavior Characteristics Guarded  Mood Anxious;Preoccupied  Thought Process  Coherency WDL  Content WDL  Delusions None reported or observed  Perception WDL  Hallucination Auditory  Judgment Impaired  Confusion None  Danger to Self  Current suicidal ideation? Denies  Agreement Not to Harm Self Yes  Description of Agreement verbal  Danger to Others  Danger to Others None reported or observed

## 2023-01-06 NOTE — ED Notes (Signed)
Pt was assessed and admitted to obs.  Affect flat with anxious mood.   Labs and EKG were obtained.  Skin check performed and pt was brought onto the unit.  Pt assigned bed 3 and laid down without incident.  Given medication for anxiety. Staff will cont to monitor for safety.

## 2023-01-06 NOTE — ED Provider Notes (Cosign Needed Addendum)
[facility] Continuous Assessment Medical Screening Exam   Date: 01/06/23 Patient Name: Lindsey Prince MRN: [id number] Chief Complaint: " I am mentally unwell."  Diagnoses:  Final diagnoses:  Auditory hallucination    HPI: Lindsey Prince is a 37 year old female that presents to [facility]gent care accompanied by her 6-year-old daughter.  Patient is requesting help with her mental health. She reported " I am mentally unwell."  She denies suicidal or homicidal ideations.  Admits to auditory hallucinations and paranoia.  States that she has not been on medications for the past year.  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  Patient is unable to recall which medication she was on in the past.  She reports previous inpatient admissions. She denied illicit drug use or substance abuse currently.  Patient observed thought blocking, anxious and paranoid.  Patient is rocking back and forth.  Patient is slow to respond to assessment questions, Patient was prescribed Haldol and Zoloft. Will restart medications where appropriate. Recommending inpatient admission.   During evaluation Lindsey Prince is sitting she is alert/oriented x 3; calm/cooperative; and mood congruent with affect.  Presents anxious, slowed and despondent. patient is speaking in a clear tone at moderate volume, and normal pace; with good eye contact.  Her thought process is coherent and relevant; she is currently responding to internal stimuli or experiencing delusional thought content.  Patient denies suicidal/self-harm/homicidal ideation.  Patient has remained calm throughout assessment.    Total Time spent with patient: 15 minutes  Musculoskeletal  Strength & Muscle Tone: within normal limits Gait & Station: normal Patient leans: N/A  Psychiatric Specialty Exam  Presentation General Appearance: Bizarre  Eye  Contact:Fleeting  Speech:Clear and Coherent  Speech Volume:Decreased  Handedness:Right   Mood and Affect  Mood:Anxious  Affect:Congruent   Thought Process  Thought Processes:Coherent  Descriptions of Associations:Intact  Orientation:Full (Time, Place and Person)  Thought Content:Paranoid Ideation  Diagnosis of Schizophrenia or Schizoaffective disorder in past: No  Duration of Psychotic Symptoms: N/A  Hallucinations:Hallucinations: Auditory  Ideas of Reference:Paranoia  Suicidal Thoughts:Suicidal Thoughts: No  Homicidal Thoughts:Homicidal Thoughts: No   Sensorium  Memory:Immediate Fair; Recent Fair  Judgment:Poor  Insight:Poor   Executive Functions  Concentration:Poor  Attention Span:Fair  Recall:Good  Fund of Knowledge:Good  Language:Good   Psychomotor Activity  Psychomotor Activity:Psychomotor Activity: Normal   Assets  Assets:Desire for Improvement   Sleep  Sleep:Sleep: Poor   Nutritional Assessment (For OBS and FBC admissions only) Has the patient had a weight loss or gain of 10 pounds or more in the last 3 months?: No Has the patient had a decrease in food intake/or appetite?: No Does the patient have dental problems?: No Does the patient have eating habits or behaviors that may be indicators of an eating disorder including binging or inducing vomiting?: No Has the patient recently lost weight without trying?: 0 Has the patient been eating poorly because of a decreased appetite?: 0 Malnutrition Screening Tool Score: 0    Physical Exam Vitals and nursing note reviewed.  Constitutional:      Appearance: Normal appearance.  Cardiovascular:     Rate and Rhythm: Normal rate and regular rhythm.  Neurological:     Mental Status: She is alert. She is disoriented.  Psychiatric:        Mood and Affect: Mood normal.    Review of Systems  Respiratory: Negative.    Cardiovascular: Negative.   Psychiatric/Behavioral:  Positive  for  hallucinations. The patient is nervous/anxious.   All other systems reviewed and are negative.   There were no vitals taken for this visit. There is no height or weight on file to calculate BMI.  Past Psychiatric History:   Is the patient at risk to self? Yes  Has the patient been a risk to self in the past 6 months? Yes .    Has the patient been a risk to self within the distant past? No   Is the patient a risk to others? No   Has the patient been a risk to others in the past 6 months? No   Has the patient been a risk to others within the distant past? No   Past Medical History:   Family History:   Social History:   Last Labs:  No visits with results within 6 Month(s) from this visit.  Latest known visit with results is:  Admission on 11/08/2021, Discharged on 11/25/2021  Component Date Value Ref Range Status   WBC 11/10/2021 12.2 (H)  4.0 - 10.5 K/uL Final   RBC 11/10/2021 4.21  3.87 - 5.11 MIL/uL Final   Hemoglobin 11/10/2021 13.6  12.0 - 15.0 g/dL Final   HCT 16/06/9603 39.8  36.0 - 46.0 % Final   MCV 11/10/2021 94.5  80.0 - 100.0 fL Final   MCH 11/10/2021 32.3  26.0 - 34.0 pg Final   MCHC 11/10/2021 34.2  30.0 - 36.0 g/dL Final   RDW 54/05/8118 12.6  11.5 - 15.5 % Final   Platelets 11/10/2021 416 (H)  150 - 400 K/uL Final   nRBC 11/10/2021 0.0  0.0 - 0.2 % Final   Neutrophils Relative % 11/10/2021 70  % Final   Neutro Abs 11/10/2021 8.5 (H)  1.7 - 7.7 K/uL Final   Lymphocytes Relative 11/10/2021 21  % Final   Lymphs Abs 11/10/2021 2.5  0.7 - 4.0 K/uL Final   Monocytes Relative 11/10/2021 7  % Final   Monocytes Absolute 11/10/2021 0.9  0.1 - 1.0 K/uL Final   Eosinophils Relative 11/10/2021 1  % Final   Eosinophils Absolute 11/10/2021 0.2  0.0 - 0.5 K/uL Final   Basophils Relative 11/10/2021 1  % Final   Basophils Absolute 11/10/2021 0.1  0.0 - 0.1 K/uL Final   Immature Granulocytes 11/10/2021 0  % Final   Abs Immature Granulocytes 11/10/2021 0.05  0.00 - 0.07 K/uL  Final   Performed at [facility], [address], [place], [place] [address]   Sodium 11/10/2021 138  135 - 145 mmol/L Final   Potassium 11/10/2021 3.7  3.5 - 5.1 mmol/L Final   Chloride 11/10/2021 105  98 - 111 mmol/L Final   CO2 11/10/2021 27  22 - 32 mmol/L Final   Glucose, Bld 11/10/2021 92  70 - 99 mg/dL Final   Glucose reference range applies only to samples taken after fasting for at least 8 hours.   BUN 11/10/2021 12  6 - 20 mg/dL Final   Creatinine, Ser 11/10/2021 0.81  0.44 - 1.00 mg/dL Final   Calcium 95/62/1308 9.1  8.9 - 10.3 mg/dL Final   Total Protein 65/78/4696 6.9  6.5 - 8.1 g/dL Final   Albumin 29/52/8413 4.1  3.5 - 5.0 g/dL Final   AST 24/40/1027 24  15 - 41 U/L Final   ALT 11/10/2021 30  0 - 44 U/L Final   Alkaline Phosphatase 11/10/2021 51  38 - 126 U/L Final   Total Bilirubin 11/10/2021 0.4  0.3 - 1.2 mg/dL Final   GFR, Estimated 11/10/2021 >60  >60 mL/min Final   Comment: (NOTE) Calculated using the CKD-EPI Creatinine Equation (2021)    Anion gap 11/10/2021 6  5 - 15 Final   Performed at [facility], [address], [place], [place] [address]   Carbamazepine Lvl 11/13/2021 3.7 (L)  4.0 - 12.0 ug/mL Final   Performed at [facility], [address] [place], [place] [address]   Sodium 11/13/2021 138  135 - 145 mmol/L Final   Potassium 11/13/2021 4.2  3.5 - 5.1 mmol/L Final   Chloride 11/13/2021 102  98 - 111 mmol/L Final   CO2 11/13/2021 26  22 - 32 mmol/L Final   Glucose, Bld 11/13/2021 82  70 - 99 mg/dL Final   Glucose reference range applies only to samples taken after fasting for at least 8 hours.   BUN 11/13/2021 11  6 - 20 mg/dL Final   Creatinine, Ser 11/13/2021 0.79  0.44 - 1.00 mg/dL Final   Calcium 09/81/1914 8.9  8.9 - 10.3 mg/dL Final   GFR, Estimated 11/13/2021 >60  >60 mL/min Final   Comment: (NOTE) Calculated using the CKD-EPI Creatinine Equation (2021)    Anion gap 11/13/2021 10  5 - 15 Final    Performed at [facility], [address] [place], [place] [address]   WBC 11/13/2021 8.1  4.0 - 10.5 K/uL Final   RBC 11/13/2021 4.41  3.87 - 5.11 MIL/uL Final   Hemoglobin 11/13/2021 14.3  12.0 - 15.0 g/dL Final   HCT 62/13/0865 42.8  36.0 - 46.0 % Final   MCV 11/13/2021 97.1  80.0 - 100.0 fL Final   MCH 11/13/2021 32.4  26.0 - 34.0 pg Final   MCHC 11/13/2021 33.4  30.0 - 36.0 g/dL Final   RDW 78/46/9629 12.8  11.5 - 15.5 % Final   Platelets 11/13/2021 432 (H)  150 - 400 K/uL Final   nRBC 11/13/2021 0.0  0.0 - 0.2 % Final   Neutrophils Relative % 11/13/2021 44  % Final   Neutro Abs 11/13/2021 3.6  1.7 - 7.7 K/uL Final   Lymphocytes Relative 11/13/2021 41  % Final   Lymphs Abs 11/13/2021 3.3  0.7 - 4.0 K/uL Final   Monocytes Relative 11/13/2021 10  % Final   Monocytes Absolute 11/13/2021 0.8  0.1 - 1.0 K/uL Final   Eosinophils Relative 11/13/2021 4  % Final   Eosinophils Absolute 11/13/2021 0.3  0.0 - 0.5 K/uL Final   Basophils Relative 11/13/2021 1  % Final   Basophils Absolute 11/13/2021 0.1  0.0 - 0.1 K/uL Final   Immature Granulocytes 11/13/2021 0  % Final   Abs Immature Granulocytes 11/13/2021 0.03  0.00 - 0.07 K/uL Final   Performed at [facility], [address] [place], [place] [address]   Color, Urine 11/16/2021 COLORLESS (A)  YELLOW Final   APPearance 11/16/2021 HAZY (A)  CLEAR Final   Specific Gravity, Urine 11/16/2021 1.005  1.005 - 1.030 Final   pH 11/16/2021 8.0  5.0 - 8.0 Final   Glucose, UA 11/16/2021 NEGATIVE  NEGATIVE mg/dL Final   Hgb urine dipstick 11/16/2021 NEGATIVE  NEGATIVE Final   Bilirubin Urine 11/16/2021 NEGATIVE  NEGATIVE Final   Ketones, ur 11/16/2021 NEGATIVE  NEGATIVE mg/dL Final   Protein, ur 32/44/0102 NEGATIVE  NEGATIVE mg/dL Final   Nitrite 72/53/6644 NEGATIVE  NEGATIVE Final   Leukocytes,Ua 11/16/2021 NEGATIVE  NEGATIVE Final   RBC /  HPF 11/16/2021 0-5  0 - 5 RBC/hpf Final   WBC, UA 11/16/2021 0-5  0 -  5 WBC/hpf Final   Bacteria, UA 11/16/2021 RARE (A)  NONE SEEN Final   Squamous Epithelial / HPF 11/16/2021 0-5  0 - 5 Final   Performed at [facility], [address] [place], [place] [address]   TSH 11/17/2021 2.710  0.350 - 4.500 uIU/mL Final   Comment: Performed by a 3rd Generation assay with a functional sensitivity of <=0.01 uIU/mL. Performed at [facility], [address] [place], [place] [address]    Cholesterol 11/17/2021 171  0 - 200 mg/dL Final   Triglycerides 09/81/1914 172 (H)  <150 mg/dL Final   HDL 78/29/5621 58  >40 mg/dL Final   Total CHOL/HDL Ratio 11/17/2021 2.9  RATIO Final   VLDL 11/17/2021 34  0 - 40 mg/dL Final   LDL Cholesterol 11/17/2021 79  0 - 99 mg/dL Final   Comment:        Total Cholesterol/HDL:CHD Risk Coronary Heart Disease Risk Table                     Men   Women  1/2 Average Risk   3.4   3.3  Average Risk       5.0   4.4  2 X Average Risk   9.6   7.1  3 X Average Risk  23.4   11.0        Use the calculated Patient Ratio above and the CHD Risk Table to determine the patient's CHD Risk.        ATP III CLASSIFICATION (LDL):  <100     mg/dL   Optimal  308-657  mg/dL   Near or Above                    Optimal  130-159  mg/dL   Borderline  846-962  mg/dL   High  >952     mg/dL   Very High Performed at [facility], [address] [place], [place] [address]    Hgb A1c MFr Bld 11/17/2021 5.2  4.8 - 5.6 % Final   Comment: (NOTE)         Prediabetes: 5.7 - 6.4         Diabetes: >6.4         Glycemic control for adults with diabetes: <7.0    Mean Plasma Glucose 11/17/2021 103  mg/dL Final   Comment: (NOTE) Performed At: [facility] [address] [place], [place] [id number] Jolene Schimke MD DG:[id number]    Valproic Acid Lvl 11/20/2021 24 (L)  50.0 - 100.0 ug/mL Final   Performed at [facility], [address], [place], [place] [address]   WBC  11/20/2021 8.6  4.0 - 10.5 K/uL Final   RBC 11/20/2021 3.99  3.87 - 5.11 MIL/uL Final   Hemoglobin 11/20/2021 13.0  12.0 - 15.0 g/dL Final   HCT 75/64/3329 38.1  36.0 - 46.0 % Final   MCV 11/20/2021 95.5  80.0 - 100.0 fL Final   MCH 11/20/2021 32.6  26.0 - 34.0 pg Final   MCHC 11/20/2021 34.1  30.0 - 36.0 g/dL Final   RDW 51/88/4166 12.8  11.5 - 15.5 % Final   Platelets 11/20/2021 465 (H)  150 - 400 K/uL Final   nRBC 11/20/2021 0.0  0.0 - 0.2 % Final   Neutrophils Relative % 11/20/2021 45  %  Final   Neutro Abs 11/20/2021 3.9  1.7 - 7.7 K/uL Final   Lymphocytes Relative 11/20/2021 42  % Final   Lymphs Abs 11/20/2021 3.6  0.7 - 4.0 K/uL Final   Monocytes Relative 11/20/2021 9  % Final   Monocytes Absolute 11/20/2021 0.8  0.1 - 1.0 K/uL Final   Eosinophils Relative 11/20/2021 3  % Final   Eosinophils Absolute 11/20/2021 0.3  0.0 - 0.5 K/uL Final   Basophils Relative 11/20/2021 1  % Final   Basophils Absolute 11/20/2021 0.1  0.0 - 0.1 K/uL Final   Immature Granulocytes 11/20/2021 0  % Final   Abs Immature Granulocytes 11/20/2021 0.03  0.00 - 0.07 K/uL Final   Performed at [facility], [address], [place], [place] [address]   Sodium 11/20/2021 137  135 - 145 mmol/L Final   Potassium 11/20/2021 4.2  3.5 - 5.1 mmol/L Final   Chloride 11/20/2021 102  98 - 111 mmol/L Final   CO2 11/20/2021 27  22 - 32 mmol/L Final   Glucose, Bld 11/20/2021 106 (H)  70 - 99 mg/dL Final   Glucose reference range applies only to samples taken after fasting for at least 8 hours.   BUN 11/20/2021 14  6 - 20 mg/dL Final   Creatinine, Ser 11/20/2021 0.68  0.44 - 1.00 mg/dL Final   Calcium 60/45/4098 9.0  8.9 - 10.3 mg/dL Final   Total Protein 11/91/4782 6.9  6.5 - 8.1 g/dL Final   Albumin 95/62/1308 3.9  3.5 - 5.0 g/dL Final   AST 65/78/4696 24  15 - 41 U/L Final   ALT 11/20/2021 38  0 - 44 U/L Final   Alkaline Phosphatase 11/20/2021 59  38 - 126 U/L Final   Total Bilirubin 11/20/2021 0.2  (L)  0.3 - 1.2 mg/dL Final   GFR, Estimated 11/20/2021 >60  >60 mL/min Final   Comment: (NOTE) Calculated using the CKD-EPI Creatinine Equation (2021)    Anion gap 11/20/2021 8  5 - 15 Final   Performed at [facility], [address], [place], [place] [address]   Valproic Acid Lvl 11/25/2021 42 (L)  50.0 - 100.0 ug/mL Final   Performed at [facility], [address] [place], [place] [address]   SARS Coronavirus 2 by RT PCR 11/24/2021 NEGATIVE  NEGATIVE Final   Comment: (NOTE) SARS-CoV-2 target nucleic acids are NOT DETECTED.  The SARS-CoV-2 RNA is generally detectable in upper respiratory specimens during the acute phase of infection. The lowest concentration of SARS-CoV-2 viral copies this assay can detect is 138 copies/mL. A negative result does not preclude SARS-Cov-2 infection and should not be used as the sole basis for treatment or other patient management decisions. A negative result may occur with  improper specimen collection/handling, submission of specimen other than nasopharyngeal swab, presence of viral mutation(s) within the areas targeted by this assay, and inadequate number of viral copies(<138 copies/mL). A negative result must be combined with clinical observations, patient history, and epidemiological information. The expected result is Negative.  Fact Sheet for Patients:  BloggerCourse.com  Fact Sheet for Healthcare Providers:  SeriousBroker.it  This test is no                          t yet approved or cleared by the [place] FDA and  has been authorized for detection and/or diagnosis of SARS-CoV-2 by FDA under an Emergency Use Authorization (EUA). This EUA will  remain  in effect (meaning this test can be used) for the duration of the COVID-19 declaration under Section 564(b)(1) of the Act, 21 U.S.C.section 360bbb-3(b)(1), unless the authorization is terminated   or revoked sooner.       Influenza A by PCR 11/24/2021 NEGATIVE  NEGATIVE Final   Influenza B by PCR 11/24/2021 NEGATIVE  NEGATIVE Final   Comment: (NOTE) The Xpert Xpress SARS-CoV-2/FLU/RSV plus assay is intended as an aid in the diagnosis of influenza from Nasopharyngeal swab specimens and should not be used as a sole basis for treatment. Nasal washings and aspirates are unacceptable for Xpert Xpress SARS-CoV-2/FLU/RSV testing.  Fact Sheet for Patients: BloggerCourse.com  Fact Sheet for Healthcare Providers: SeriousBroker.it  This test is not yet approved or cleared by the [place] FDA and has been authorized for detection and/or diagnosis of SARS-CoV-2 by FDA under an Emergency Use Authorization (EUA). This EUA will remain in effect (meaning this test can be used) for the duration of the COVID-19 declaration under Section 564(b)(1) of the Act, 21 U.S.C. section 360bbb-3(b)(1), unless the authorization is terminated or revoked.  Performed at [facility], [address], [place], [place] [address]     Allergies: Percocet [oxycodone-acetaminophen]  Medications:  Facility Ordered Medications  Medication   alum & mag hydroxide-simeth (MAALOX/MYLANTA) 200-200-20 MG/5ML suspension 30 mL   magnesium hydroxide (MILK OF MAGNESIA) suspension 30 mL   traZODone (DESYREL) tablet 50 mg   LORazepam (ATIVAN) tablet 1 mg   Or   LORazepam (ATIVAN) tablet 1 mg   PTA Medications  Medication Sig   benztropine (COGENTIN) 0.5 MG tablet Take 1 tablet (0.5 mg total) by mouth 2 (two) times daily as needed for tremors.   haloperidol decanoate (HALDOL DECANOATE) 50 MG/ML injection Inject 1 mL (50 mg total) into the muscle every 28 (twenty-eight) days.   haloperidol (HALDOL) 5 MG tablet Take 1 tablet (5 mg total) by mouth 2 (two) times daily.   hydrOXYzine (ATARAX) 25 MG tablet Take 1 tablet (25 mg total) by mouth every  6 (six) hours as needed for anxiety.   melatonin 5 MG TABS Take 1 tablet (5 mg total) by mouth at bedtime.   propranolol (INDERAL) 10 MG tablet Take 1 tablet (10 mg total) by mouth 3 (three) times daily.   sertraline (ZOLOFT) 25 MG tablet Take 1 tablet (25 mg total) by mouth daily.   divalproex (DEPAKOTE ER) 500 MG 24 hr tablet Take 2 tablets (1,000 mg total) by mouth at bedtime.      Medical Decision Making  Inpatient Admission -Initiated involuntary commitment -Will restart Haldol 5 mg p.o. twice daily And Depakote 250 mg twice daily    Recommendations  Based on my evaluation the patient does not appear to have an emergency medical condition.   - Patient accepted [facility] [address]  Oneta Rack, NP 01/06/23  10:18 AM

## 2023-01-07 ENCOUNTER — Encounter (HOSPITAL_COMMUNITY): Payer: Self-pay | Admitting: Psychiatry

## 2023-01-07 MED ORDER — BENZTROPINE MESYLATE 0.5 MG PO TABS
0.5000 mg | ORAL_TABLET | Freq: Two times a day (BID) | ORAL | Status: DC
  Administered 2023-01-07 – 2023-01-10 (×6): 0.5 mg via ORAL
  Filled 2023-01-07 (×11): qty 1

## 2023-01-07 MED ORDER — MELATONIN 5 MG PO TABS
5.0000 mg | ORAL_TABLET | Freq: Every day | ORAL | Status: DC
  Administered 2023-01-07: 5 mg via ORAL
  Filled 2023-01-07 (×4): qty 1

## 2023-01-07 MED ORDER — HYDROXYZINE HCL 25 MG PO TABS
25.0000 mg | ORAL_TABLET | ORAL | Status: DC | PRN
  Administered 2023-01-08 – 2023-01-10 (×2): 25 mg via ORAL
  Filled 2023-01-07 (×2): qty 1
  Filled 2023-01-07: qty 10

## 2023-01-07 MED ORDER — HALOPERIDOL 5 MG PO TABS
5.0000 mg | ORAL_TABLET | Freq: Two times a day (BID) | ORAL | Status: DC
  Administered 2023-01-07 – 2023-01-12 (×10): 5 mg via ORAL
  Filled 2023-01-07: qty 1
  Filled 2023-01-07 (×2): qty 20
  Filled 2023-01-07 (×2): qty 1
  Filled 2023-01-07: qty 20
  Filled 2023-01-07 (×11): qty 1
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
## 2023-01-07 NOTE — Progress Notes (Signed)
   01/07/23 2311  Psych Admission Type (Psych Patients Only)  Admission Status Involuntary  Psychosocial Assessment  Patient Complaints Anxiety;Depression  Eye Contact Fair  Facial Expression Anxious  Affect Preoccupied  Speech Unremarkable  Interaction Guarded;Minimal  Motor Activity Other (Comment) (WDL)  Appearance/Hygiene Unremarkable  Behavior Characteristics Guarded  Mood Suspicious;Preoccupied  Thought Process  Coherency WDL  Content WDL  Delusions None reported or observed  Perception WDL  Hallucination Auditory  Judgment Impaired  Confusion None  Danger to Self  Current suicidal ideation? Denies  Agreement Not to Harm Self Yes  Description of Agreement verbal  Danger to Others  Danger to Others None reported or observed

## 2023-01-07 NOTE — [facility] Group Notes (Signed)
Adult Psychoeducational Group Note  Date:  01/07/2023 Time:  9:34 AM  Group Topic/Focus:  Orientation:   The focus of this group is to educate the patient on the purpose and policies of crisis stabilization and provide a format to answer questions about their admission.  The group details unit policies and expectations of patients while admitted.  Participation Level:  Did Not Attend  Participation Quality:    Affect:    Cognitive:    Insight:   Engagement in Group:    Modes of Intervention:    Additional Comments:    Azhar Yogi A Cadence Haslam-McCall 01/07/2023, 9:34 AM

## 2023-01-07 NOTE — Progress Notes (Signed)
Patient appears anxious. Patient denies SI/HI/VH. Pt endorses command auditory hallucinations. The voices are telling her to calm down but as the day progresses the voices become aggressive. Pt reports anxiety is 10/10 and depression is 10/10. Pt reports good sleep and appetite. Pt is experiencing referential delusions. Pt reports reading books and feeling like there are messages meant for her that she is decoding. Patient complied with morning medication with no reported side effects. Patient remains safe on Q35min checks and contracts for safety.     01/07/23 0959  Psych Admission Type (Psych Patients Only)  Admission Status Involuntary  Psychosocial Assessment  Patient Complaints Anxiety;Depression  Eye Contact Fair  Facial Expression Anxious;Animated  Affect Anxious  Speech Logical/coherent  Interaction Guarded;Minimal  Appearance/Hygiene Unremarkable  Behavior Characteristics Guarded  Mood Anxious;Preoccupied  Thought Process  Coherency WDL  Content WDL  Delusions Referential  Perception WDL  Hallucination Auditory  Judgment Impaired  Confusion None  Danger to Self  Current suicidal ideation? Denies  Agreement Not to Harm Self Yes  Description of Agreement verbal  Danger to Others  Danger to Others None reported or observed

## 2023-01-07 NOTE — [facility] Group Notes (Signed)
Adult Psychoeducational Group Note  Date:  01/07/2023 Time:  2:38 PM  Group Topic/Focus:  Emotional Education:   The focus of this group is to discuss what feelings/emotions are, and how they are experienced.  The group focused on the process of reframing thought process from negative to positive.  Participation Level:  Minimal  Participation Quality:  Attentive  Affect:  Flat  Cognitive:  Appropriate  Insight: None  Engagement in Group:  Lacking  Modes of Intervention:  Discussion  Additional Comments:  Pt completed the worksheet but didn't want to share with group.  Kobie Whidby A Alaynna Kerwood-McCall 01/07/2023, 2:38 PM
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
## 2023-01-07 NOTE — Progress Notes (Signed)
   01/07/23 0959  Psych Admission Type (Psych Patients Only)  Admission Status Involuntary  Psychosocial Assessment  Patient Complaints Anxiety;Depression  Eye Contact Fair  Facial Expression Anxious;Animated  Affect Anxious  Speech Logical/coherent  Interaction Guarded;Minimal  Appearance/Hygiene Unremarkable  Behavior Characteristics Guarded  Mood Anxious;Preoccupied  Thought Process  Coherency WDL  Content WDL  Delusions None reported or observed  Perception WDL  Hallucination Auditory  Judgment Impaired  Confusion None  Danger to Self  Current suicidal ideation? Denies  Agreement Not to Harm Self Yes  Description of Agreement verbal  Danger to Others  Danger to Others None reported or observed
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
## 2023-01-07 NOTE — [facility] Suicide Risk Assessment (Signed)
[facility] INPATIENT:  Family/Significant Other Suicide Prevention Education  Suicide Prevention Education:  Patient Refusal for Family/Significant Other Suicide Prevention Education: The patient Lindsey Prince has refused to provide written consent for family/significant other to be provided Family/Significant Other Suicide Prevention Education during admission and/or prior to discharge.  Physician notified.  Carloyn Jaeger Grossman-Orr 01/07/2023, 3:03 PM

## 2023-01-07 NOTE — [facility] Group Notes (Signed)
[facility] Group Notes:  (Nursing/MHT/Case Management/Adjunct)  Date:  01/07/2023  Time:  9:16 PM  Type of Therapy:   Wrap- up group  Participation Level:  Minimal  Participation Quality:  Resistant  Affect:  Flat  Cognitive:  Lacking  Insight:  Lacking  Engagement in Group:  Limited  Modes of Intervention:  Education  Summary of Progress/Problems: Goal to get her daughter back. Day 6/10.   Lindsey Prince 01/07/2023, 9:16 PM

## 2023-01-08 LAB — HEMOGLOBIN A1C
Hgb A1c MFr Bld: 4.9 % (ref 4.8–5.6)
Mean Plasma Glucose: 93.93 mg/dL

## 2023-01-08 LAB — LIPID PANEL
Cholesterol: 111 mg/dL (ref 0–200)
HDL: 50 mg/dL (ref >40)
LDL Cholesterol: 44 mg/dL (ref 0–99)
Total CHOL/HDL Ratio: 2.2 RATIO
Triglycerides: 87 mg/dL (ref <150)
VLDL: 17 mg/dL (ref 0–40)

## 2023-01-08 MED ORDER — MELATONIN 5 MG PO TABS
5.0000 mg | ORAL_TABLET | Freq: Every day | ORAL | Status: DC
  Administered 2023-01-08 – 2023-01-11 (×4): 5 mg via ORAL
  Filled 2023-01-08: qty 10
  Filled 2023-01-08 (×3): qty 1
  Filled 2023-01-08: qty 10
  Filled 2023-01-08 (×2): qty 1

## 2023-01-08 MED ORDER — TRAZODONE HCL 100 MG PO TABS
100.0000 mg | ORAL_TABLET | Freq: Every day | ORAL | Status: DC
Start: 2023-01-08 — End: 2023-01-08

## 2023-01-08 MED ORDER — TRAZODONE HCL 50 MG PO TABS
50.0000 mg | ORAL_TABLET | Freq: Every day | ORAL | Status: DC
  Filled 2023-01-08: qty 1

## 2023-01-08 MED ORDER — IBUPROFEN 400 MG PO TABS
400.0000 mg | ORAL_TABLET | Freq: Four times a day (QID) | ORAL | Status: DC | PRN
  Administered 2023-01-08: 400 mg via ORAL
  Filled 2023-01-08: qty 1

## 2023-01-08 MED ORDER — WHITE PETROLATUM EX OINT
TOPICAL_OINTMENT | CUTANEOUS | Status: AC
  Filled 2023-01-08: qty 5

## 2023-01-08 MED ORDER — PROPRANOLOL HCL 10 MG PO TABS
10.0000 mg | ORAL_TABLET | Freq: Three times a day (TID) | ORAL | Status: DC
  Administered 2023-01-08 – 2023-01-12 (×11): 10 mg via ORAL
  Filled 2023-01-08: qty 30
  Filled 2023-01-08 (×4): qty 1
  Filled 2023-01-08: qty 30
  Filled 2023-01-08: qty 1
  Filled 2023-01-08: qty 30
  Filled 2023-01-08 (×7): qty 1
  Filled 2023-01-08: qty 30
  Filled 2023-01-08 (×4): qty 1

## 2023-01-08 MED ORDER — TRAZODONE HCL 100 MG PO TABS
100.0000 mg | ORAL_TABLET | Freq: Every day | ORAL | Status: DC
  Administered 2023-01-08 – 2023-01-11 (×4): 100 mg via ORAL
  Filled 2023-01-08 (×2): qty 1
  Filled 2023-01-08 (×2): qty 10
  Filled 2023-01-08 (×4): qty 1

## 2023-01-08 NOTE — Group Note (Signed)
Date:  01/08/2023 Time:  6:29 PM  Group Topic/Focus:  Dimensions of Wellness:   The focus of this group is to introduce the topic of wellness and discuss the role each dimension of wellness plays in total health.    Participation Level:  Did Not Attend  Participation Quality:   n/a  Affect:   n/a  Cognitive:   n/a  Insight: None  Engagement in Group:   n/a  Modes of Intervention:   n/a  Additional Comments:   Pt did not attend.  Edmund Hilda Sherri Mcarthy 01/08/2023, 6:29 PM

## 2023-01-08 NOTE — [facility] Group Notes (Signed)
Adult Psychoeducational Group Note  Date:  01/08/2023 Time:  10:34 PM  Group Topic/Focus:  Wrap-Up Group:   The focus of this group is to help patients review their daily goal of treatment and discuss progress on daily workbooks.  Participation Level:  Active  Participation Quality:  Appropriate  Affect:  Depressed  Cognitive:  Appropriate  Insight: Appropriate  Engagement in Group:  Engaged  Modes of Intervention:  Discussion and Support  Additional Comments:  Pt attended the evening group and responded to all discussion prompts from the Writer. Pt shared that today was an "okay" day on the unit, the highlight of which was going to the gym with her peers. On the subject of goals for the coming week, Pt told that she wanted to learn more about her diagnosis and get help figuring out a discharge plan. "I just don't know where to start." Pt appeared quite sullen and rated her day a 5 out of 10.  Christ Kick 01/08/2023, 10:34 PM

## 2023-01-08 NOTE — Group Note (Signed)
Date:  01/08/2023 Time:  6:54 PM  Group Topic/Focus:  Dimensions of Wellness:   The focus of this group is to introduce the topic of wellness and discuss the role each dimension of wellness plays in total health.    Participation Level:  Did Not Attend  Participation Quality:   n/a  Affect:   n/a  Cognitive:   n/a  Insight: None  Engagement in Group:   n/a  Modes of Intervention:   n/a  Additional Comments:   Pt did not attend.  Lindsey Prince 01/08/2023, 6:54 PM

## 2023-01-08 NOTE — Progress Notes (Signed)
[facility] MD Progress Note  01/08/2023 9:15 AM Lindsey Prince  MRN:  [id number]  Reason for admission: 37 year old Caucasian female with hx of mental illness & cannabis/tobacco use disorders. Admitted to the [facility] from the [facility] with complaints of worsening auditory hallucinations & positive suicidal ideations without any plans or intents. Patient apparently had arrived at the [facility] yesterday citing that she had come to seek mental health evaluation for her 12 year old daughter. It was after initiation of mental health evaluation that the [facility] providers realized patient needed mental healthy evaluation herself & not her daughter. After evaluation, she was transferred to the [facility] for further psychiatric evaluation/treatments.   Daily notes: Lindsey Prince is seen in her room. Chart reviewed. The chart findings discussed with the treatment team. She presents very irritated & short tempered this morning. She reports, "I feel tired this morning, that is the reason I'm lying down this morning. I did not sleep at all last night. The nurse did not give me any sleep medicines. Then, every so second, someone is opening the door & flashing the light in here. How can anyone get any sleep that way. I'm not feeling happy. There are so much I do not know to ask the nurse. The nurse should know that I need to sleep, that I may need some medicine for that. Tell the staff here, I do not need to be checked at all time when I'm trying to get some sleep. Yes I did go to the group session this morning. We are done now & I'm back to my room". Lindsey Prince currently denies any SIHI, AVH, delusional thoughts or paranoia. She does not appear to be responding to any internal stimuli. Lindsey Prince is instructed & informed that she did get her Trazodone & Melatonin last night for sleep. And it may be that the dose of the Trazodone needs to be adjusted. The Trazodone is increased to 100 mg & changed from prn to scheduled. She is also empathized with but explained that it is  the duty of the staff to check each patient from time to time during sleep hours to assure all is well. She also complained of mild aches/pain, received Ibuprofen on a prn basis. Discussed this case with the attending psychiatrist, see the treatment plan below.   Principal Problem: Bipolar I, most recent episode mixed, severe with psychotic behavior (HCC)  Diagnosis: Principal Problem:   Bipolar I, most recent episode mixed, severe with psychotic behavior (HCC) Active Problems:   PTSD (post-traumatic stress disorder)   Auditory hallucination  Total Time spent with patient:  35 minutes  Past Psychiatric History: See H&P.  Past Medical History:  Past Medical History:  Diagnosis Date   Anemia    Herpes     Past Surgical History:  Procedure Laterality Date   NO PAST SURGERIES     Family History:  Family History  Problem Relation Age of Onset   Healthy Neg Hx    Family Psychiatric  History: See H&P.  Social History:  Social History   Substance and Sexual Activity  Alcohol Use No     Social History   Substance and Sexual Activity  Drug Use Yes   Types: Marijuana    Social History   Socioeconomic History   Marital status: Single    Spouse name: Not on file   Number of children: Not on file   Years of education: Not on file   Highest education level: Not on file  Occupational  History   Not on file  Tobacco Use   Smoking status: Every Day    Packs/day: 1.5    Types: Cigarettes   Smokeless tobacco: Never  Substance and Sexual Activity   Alcohol use: No   Drug use: Yes    Types: Marijuana   Sexual activity: Not Currently  Other Topics Concern   Not on file  Social History Narrative   Not on file   Social Determinants of Health   Financial Resource Strain: Not on file  Food Insecurity: No Food Insecurity (01/06/2023)   Hunger Vital Sign    Worried About Running Out of Food in the Last Year: Never true    Ran Out of Food in the Last Year: Never true   Transportation Needs: No Transportation Needs (01/06/2023)   PRAPARE - Administrator, Civil Service (Medical): No    Lack of Transportation (Non-Medical): No  Physical Activity: Not on file  Stress: Not on file  Social Connections: Not on file   Additional Social History:   Sleep: Poor  Appetite:  Good  Current Medications: Current Facility-Administered Medications  Medication Dose Route Frequency Provider Last Rate Last Admin   alum & mag hydroxide-simeth (MAALOX/MYLANTA) 200-200-20 MG/5ML suspension 30 mL  30 mL Oral Q4H PRN Oneta Rack, NP       benztropine (COGENTIN) tablet 0.5 mg  0.5 mg Oral BID Armandina Stammer I, NP   0.5 mg at 01/08/23 0813   haloperidol (HALDOL) tablet 5 mg  5 mg Oral BID Armandina Stammer I, NP   5 mg at 01/08/23 0813   hydrOXYzine (ATARAX) tablet 25 mg  25 mg Oral Q4H PRN Armandina Stammer I, NP       LORazepam (ATIVAN) tablet 2 mg  2 mg Oral TID PRN Oneta Rack, NP   2 mg at 01/06/23 2112   Or   LORazepam (ATIVAN) injection 2 mg  2 mg Intramuscular TID PRN Oneta Rack, NP       magnesium hydroxide (MILK OF MAGNESIA) suspension 30 mL  30 mL Oral Daily PRN Oneta Rack, NP       melatonin tablet 5 mg  5 mg Oral QHS Armandina Stammer I, NP   5 mg at 01/07/23 2131   traZODone (DESYREL) tablet 50 mg  50 mg Oral QHS PRN Armandina Stammer I, NP   50 mg at 01/07/23 2131   Lab Results:  Results for orders placed or performed during the hospital encounter of 01/06/23 (from the past 48 hour(s))  Lipid panel     Status: None   Collection Time: 01/08/23  6:40 AM  Result Value Ref Range   Cholesterol 111 0 - 200 mg/dL   Triglycerides 87 <161 mg/dL   HDL 50 >09 mg/dL   Total CHOL/HDL Ratio 2.2 RATIO   VLDL 17 0 - 40 mg/dL   LDL Cholesterol 44 0 - 99 mg/dL    Comment:        Total Cholesterol/HDL:CHD Risk Coronary Heart Disease Risk Table                     Men   Women  1/2 Average Risk   3.4   3.3  Average Risk       5.0   4.4  2 X Average Risk   9.6    7.1  3 X Average Risk  23.4   11.0        Use the calculated  Patient Ratio above and the CHD Risk Table to determine the patient's CHD Risk.        ATP III CLASSIFICATION (LDL):  <100     mg/dL   Optimal  161-096  mg/dL   Near or Above                    Optimal  130-159  mg/dL   Borderline  045-409  mg/dL   High  >811     mg/dL   Very High Performed at [facility] ([place] [address] [place], [place] [address]    Blood Alcohol level:  Lab Results  Component Value Date   ETH <10 11/06/2021   ETH <10 01/24/2021   Metabolic Disorder Labs: Lab Results  Component Value Date   HGBA1C 5.2 11/17/2021   MPG 103 11/17/2021   MPG 108.28 01/24/2021   No results found for: "PROLACTIN" Lab Results  Component Value Date   CHOL 111 01/08/2023   TRIG 87 01/08/2023   HDL 50 01/08/2023   CHOLHDL 2.2 01/08/2023   VLDL 17 01/08/2023   LDLCALC 44 01/08/2023   LDLCALC 79 11/17/2021   Physical Findings: AIMS:  , ,  ,  ,    CIWA:    COWS:     Musculoskeletal: Strength & Muscle Tone: within normal limits Gait & Station: normal Patient leans: N/A  Psychiatric Specialty Exam:  Presentation  General Appearance:  Fairly Groomed (guarded)  Eye Contact: Fair  Speech: Clear and Coherent; Slow  Speech Volume: Decreased  Handedness: Right   Mood and Affect  Mood: Anxious; Depressed  Affect: Congruent; Depressed   Thought Process  Thought Processes: Coherent  Descriptions of Associations:Intact  Orientation:Full (Time, Place and Person)  Thought Content:Logical  History of Schizophrenia/Schizoaffective disorder:No  Duration of Psychotic Symptoms:N/A  Hallucinations:Hallucinations: Auditory Description of Auditory Hallucinations: "The voices are positive & at times negative".  Ideas of Reference:None  Suicidal Thoughts:Suicidal Thoughts: No  Homicidal Thoughts:Homicidal Thoughts: No   Sensorium  Memory: Immediate Fair; Recent Fair;  Remote Good  Judgment: Impaired  Insight: Fair  Chartered certified accountant: Fair  Attention Span: Fair  Recall: Fair  Fund of Knowledge: Fair  Language: Good  Psychomotor Activity  Psychomotor Activity: Psychomotor Activity: Decreased  Assets  Assets: Communication Skills; Desire for Improvement; Financial Resources/Insurance; Housing; Physical Health; Resilience  Sleep  Sleep: Sleep: Poor Number of Hours of Sleep: 4.5  Physical Exam: Physical Exam Vitals and nursing note reviewed.  HENT:     Nose: Nose normal.  Neurological:     Mental Status: She is alert.    Review of Systems  Constitutional:  Negative for chills, diaphoresis and fever.  HENT:  Negative for congestion and sore throat.   Respiratory:  Negative for cough, shortness of breath and wheezing.   Cardiovascular:  Negative for chest pain and palpitations.  Gastrointestinal:  Negative for abdominal pain, constipation, diarrhea, heartburn, nausea and vomiting.  Neurological:  Negative for dizziness, tingling, tremors, sensory change, speech change, focal weakness, seizures, loss of consciousness, weakness and headaches.  Psychiatric/Behavioral:  Positive for depression. Negative for hallucinations (Hx psychosis.), memory loss, substance abuse and suicidal ideas. The patient is nervous/anxious and has insomnia.    Blood pressure 103/80, pulse 80, temperature 97.7 F (36.5 C), temperature source Oral, resp. rate 18, height 5\' 2"  (1.575 m), weight 60.1 kg, SpO2 99 %. Body mass index is 24.25 kg/m.  Treatment Plan Summary: Daily contact with patient to assess and evaluate symptoms  and progress in treatment and Medication management.   Continue inpatient hospitalization.  Will continue today 01/08/2023 plan as below except where it is noted.   Principal/active diagnoses.  Bipolar I, most recent episode mixed, severe with psychotic behavior (HCC).    Associated symptoms.  Auditory  hallucinations.  Insomnia.  Anxiety.  Plan:  -Continue haldol 5 mg po bid for mood control.  -Continue benztropine 0.5 mg po bid for eps prevention.  -Continue Hydroxyzine 25 mg po qid prn for anxiety.  -Continue Propranolol 10 mg po bid for ^heart rate/anxiety.  -Continue Trazodone 50 mg po Q hs for insomnia.  -Continue Melatonin 5 mg po Q hs for insomnia.    Agitation/severe anxiety.  -Continue Ativan 2 mg po or IM tid prn.   Other PRNS -Continue Tylenol 650 mg every 6 hours PRN for mild pain -Continue Maalox 30 ml Q 4 hrs PRN for indigestion -Continue MOM 30 ml po Q 6 hrs for constipation.  -Continue Ibuprofen 400 mg po Q 6 hrs prn for pain.   Safety and Monitoring: Voluntary admission to inpatient psychiatric unit for safety, stabilization and treatment Daily contact with patient to assess and evaluate symptoms and progress in treatment Patient's case to be discussed in multi-disciplinary team meeting Observation Level : q15 minute checks Vital signs: q12 hours Precautions: Safety   Discharge Planning: Social work and case management to assist with discharge planning and identification of hospital follow-up needs prior to discharge Estimated LOS: 5-7 days Discharge Concerns: Need to establish a safety plan; Medication compliance and effectiveness Discharge Goals: Return home with outpatient referrals for mental health follow-up including medication management/psychotherapy  Armandina Stammer, NP, pmhmp, fnp-bc 01/08/2023, 9:15 AM

## 2023-01-08 NOTE — Progress Notes (Addendum)
D. Pt presented irritable upon initial approach at the med window this am. When asked about sleep, pt stared at this nurse and replied in an angry tone, "What do you think?" Pt also expressed some paranoia, remarking that she "felt targeted." Pt later observed in the dayroom with peers- minimal interactions. Pt currently denies SI/HI and AVH and does not appear to be responding to internal stimuli. A. Labs and vitals monitored. Pt compliant with medications. Pt supported emotionally and encouraged to express concerns and ask questions.   R. Pt remains safe with 15 minute checks. Will continue POC.    01/08/23 1000  Psych Admission Type (Psych Patients Only)  Admission Status Involuntary  Psychosocial Assessment  Patient Complaints Sleep disturbance  Eye Contact Staring  Facial Expression Angry  Affect Preoccupied  Speech Logical/coherent  Interaction Guarded  Motor Activity Other (Comment) (steady gait)  Appearance/Hygiene Unremarkable  Behavior Characteristics Guarded  Mood Preoccupied;Suspicious  Thought Process  Coherency WDL  Content Paranoia  Delusions Persecutory  Perception WDL  Hallucination Auditory  Judgment Impaired  Confusion None  Danger to Self  Current suicidal ideation? Denies  Agreement Not to Harm Self Yes  Description of Agreement  (verbal contract for safety)  Danger to Others  Danger to Others None reported or observed  Danger to Others Abnormal  Harmful Behavior to others No threats or harm toward other people

## 2023-01-08 NOTE — Group Note (Signed)
Date:  01/08/2023 Time:  11:38 AM  Group Topic/Focus:  Goals Group:   The focus of this group is to help patients establish daily goals to achieve during treatment and discuss how the patient can incorporate goal setting into their daily lives to aide in recovery. Orientation:   The focus of this group is to educate the patient on the purpose and policies of crisis stabilization and provide a format to answer questions about their admission.  The group details unit policies and expectations of patients while admitted.    Participation Level:  Did Not Attend  Participation Quality:   n/a  Affect:   n/a  Cognitive:   n/a  Insight: None  Engagement in Group:   n/a  Modes of Intervention:   n/a  Additional Comments:   Pt did not attend.  Edmund Hilda Nyriah Coote 01/08/2023, 11:38 AM

## 2023-01-08 NOTE — Group Note (Signed)
Date:  01/08/2023 Time:  4:34 PM  Group Topic/Focus:  Dimensions of Wellness:   The focus of this group is to introduce the topic of wellness and discuss the role each dimension of wellness plays in total health.    Participation Level:  Did Not Attend  Participation Quality:   n/a  Affect:   n/a  Cognitive:   n/a  Insight: None  Engagement in Group:   n/a  Modes of Intervention:   n/a  Additional Comments:   Pt did not attend.  Lindsey Prince 01/08/2023, 4:34 PM

## 2023-01-08 NOTE — Progress Notes (Signed)
   01/08/23 2210  Psych Admission Type (Psych Patients Only)  Admission Status Involuntary  Psychosocial Assessment  Patient Complaints Sleep disturbance  Eye Contact Fair  Facial Expression Animated  Affect Appropriate to circumstance  Speech Logical/coherent  Interaction Guarded  Motor Activity Other (Comment) (WDL)  Appearance/Hygiene Unremarkable  Behavior Characteristics Guarded  Mood Pleasant  Thought Process  Coherency WDL  Content Paranoia  Delusions Paranoid;Persecutory  Perception Hallucinations  Hallucination Auditory  Judgment Impaired  Confusion None  Danger to Self  Current suicidal ideation? Denies  Agreement Not to Harm Self Yes  Description of Agreement verbal  Danger to Others Abnormal  Harmful Behavior to others No threats or harm toward other people  Destructive Behavior No threats or harm toward property

## 2023-01-09 ENCOUNTER — Encounter (HOSPITAL_COMMUNITY): Payer: Self-pay

## 2023-01-09 NOTE — Progress Notes (Signed)
   01/09/23 1940  Psych Admission Type (Psych Patients Only)  Admission Status Involuntary  Psychosocial Assessment  Patient Complaints Anxiety;Depression  Eye Contact Fair  Facial Expression Flat  Affect Anxious;Preoccupied  Speech Logical/coherent  Interaction Guarded  Motor Activity Other (Comment) (WDL)  Appearance/Hygiene In scrubs  Behavior Characteristics Guarded  Mood Anxious  Thought Process  Coherency WDL  Content Paranoia  Delusions Paranoid  Perception Hallucinations  Hallucination Auditory  Judgment Poor  Confusion None  Danger to Self  Current suicidal ideation? Denies  Agreement Not to Harm Self Yes  Description of Agreement Verbal  Danger to Others  Danger to Others None reported or observed  Danger to Others Abnormal  Harmful Behavior to others No threats or harm toward other people  Destructive Behavior No threats or harm toward property

## 2023-01-09 NOTE — [facility] Group Notes (Signed)
Spirituality group facilitated by Kathleen Argue, BCC.  Group Description: Group focused on topic of hope. Patients participated in facilitated discussion around topic, connecting with one another around experiences and definitions for hope. Group members engaged with visual explorer photos, reflecting on what hope looks like for them today. Group engaged in discussion around how their definitions of hope are present today in hospital.  Modalities: Psycho-social ed, Adlerian, Narrative, MI  Patient Progress: Lindsey Prince attended the last half of group, but did not participate.

## 2023-01-09 NOTE — Group Note (Signed)
Recreation Therapy Group Note   Group Topic:Problem Solving  Group Date: 01/09/2023 Start Time: 0932 End Time: 0956 Facilitators: Cordelle Dahmen-McCall, LRT,CTRS Location: [address] Dayroom   Goal Area(s) Addresses:  Patient will effectively work with peer towards shared goal.  Patient will identify skills used to make activity successful.  Patient will identify how skills used during activity can be applied to reach post d/c goals.   Group Description: [employer]. In teams of 5-6, patients were given 12 craft pipe cleaners. Using the materials provided, patients were instructed to compete again the opposing team(s) to build the tallest free-standing structure from floor level. The activity was timed; difficulty increased by Clinical research associate as Production designer, theatre/television/film continued.  Systematically resources were removed with additional directions for example, placing one arm behind their back, working in silence, and shape stipulations. LRT facilitated post-activity discussion reviewing team processes and necessary communication skills involved in completion. Patients were encouraged to reflect how the skills utilized, or not utilized, in this activity can be incorporated to positively impact support systems post discharge.   Affect/Mood: Flat   Participation Level: None   Participation Quality: N/A   Behavior: On-looking   Speech/Thought Process: N/A   Insight: N/A   Judgement: N/A   Modes of Intervention: STEM Activity   Patient Response to Interventions:  Attentive   Education Outcome:  Acknowledges education   Clinical Observations/Individualized Feedback: Pt did not participate in activity.  Pt observed as peers completed the activity.     Plan: Continue to engage patient in RT group sessions 2-3x/week.   Clemie General-McCall, LRT,CTRS 01/09/2023 1:00 PM

## 2023-01-09 NOTE — BH IP Treatment Plan (Signed)
Interdisciplinary Treatment and Diagnostic Plan Update  01/09/2023 Time of Session: 0830 Lindsey Prince MRN: [id number]  Principal Diagnosis: Bipolar I, most recent episode mixed, severe with psychotic behavior (HCC)  Secondary Diagnoses: Principal Problem:   Bipolar I, most recent episode mixed, severe with psychotic behavior (HCC) Active Problems:   PTSD (post-traumatic stress disorder)   Auditory hallucination   Current Medications:  Current Facility-Administered Medications  Medication Dose Route Frequency Provider Last Rate Last Admin   alum & mag hydroxide-simeth (MAALOX/MYLANTA) 200-200-20 MG/5ML suspension 30 mL  30 mL Oral Q4H PRN Oneta Rack, NP       benztropine (COGENTIN) tablet 0.5 mg  0.5 mg Oral BID Armandina Stammer I, NP   0.5 mg at 01/09/23 0804   haloperidol (HALDOL) tablet 5 mg  5 mg Oral BID Armandina Stammer I, NP   5 mg at 01/09/23 4098   hydrOXYzine (ATARAX) tablet 25 mg  25 mg Oral Q4H PRN Armandina Stammer I, NP   25 mg at 01/08/23 1142   ibuprofen (ADVIL) tablet 400 mg  400 mg Oral Q6H PRN Armandina Stammer I, NP   400 mg at 01/08/23 1309   LORazepam (ATIVAN) tablet 2 mg  2 mg Oral TID PRN Oneta Rack, NP   2 mg at 01/06/23 2112   Or   LORazepam (ATIVAN) injection 2 mg  2 mg Intramuscular TID PRN Oneta Rack, NP       magnesium hydroxide (MILK OF MAGNESIA) suspension 30 mL  30 mL Oral Daily PRN Oneta Rack, NP       melatonin tablet 5 mg  5 mg Oral QHS Nwoko, Nicole Kindred I, NP   5 mg at 01/08/23 2103   propranolol (INDERAL) tablet 10 mg  10 mg Oral TID Armandina Stammer I, NP   10 mg at 01/09/23 1200   traZODone (DESYREL) tablet 100 mg  100 mg Oral QHS Armandina Stammer I, NP   100 mg at 01/08/23 2103   PTA Medications: Medications Prior to Admission  Medication Sig Dispense Refill Last Dose   benztropine (COGENTIN) 0.5 MG tablet Take 1 tablet (0.5 mg total) by mouth 2 (two) times daily as needed for tremors. (Patient not taking: Reported on 01/06/2023) 60 tablet 1     divalproex (DEPAKOTE ER) 500 MG 24 hr tablet Take 2 tablets (1,000 mg total) by mouth at bedtime. (Patient not taking: Reported on 01/06/2023) 60 tablet 2    haloperidol (HALDOL) 5 MG tablet Take 1 tablet (5 mg total) by mouth 2 (two) times daily. (Patient not taking: Reported on 01/06/2023) 60 tablet 1    haloperidol decanoate (HALDOL DECANOATE) 50 MG/ML injection Inject 1 mL (50 mg total) into the muscle every 28 (twenty-eight) days. (Patient not taking: Reported on 01/06/2023) 1 mL 1    hydrOXYzine (ATARAX) 25 MG tablet Take 1 tablet (25 mg total) by mouth every 6 (six) hours as needed for anxiety. (Patient not taking: Reported on 01/06/2023) 30 tablet 1    melatonin 5 MG TABS Take 1 tablet (5 mg total) by mouth at bedtime. (Patient not taking: Reported on 01/06/2023) 30 tablet 1    propranolol (INDERAL) 10 MG tablet Take 1 tablet (10 mg total) by mouth 3 (three) times daily. (Patient not taking: Reported on 01/06/2023) 90 tablet 1    sertraline (ZOLOFT) 25 MG tablet Take 1 tablet (25 mg total) by mouth daily. (Patient not taking: Reported on 01/06/2023) 30 tablet 0     Patient Stressors: Marital or  family conflict    Patient Strengths: Physical Health   Treatment Modalities: Medication Management, Group therapy, Case management,  1 to 1 session with clinician, Psychoeducation, Recreational therapy.   Physician Treatment Plan for Primary Diagnosis: Bipolar I, most recent episode mixed, severe with psychotic behavior (HCC) Long Term Goal(s): Improvement in symptoms so as ready for discharge   Short Term Goals: Ability to identify and develop effective coping behaviors will improve Ability to maintain clinical measurements within normal limits will improve Compliance with prescribed medications will improve Ability to identify changes in lifestyle to reduce recurrence of condition will improve Ability to verbalize feelings will improve Ability to disclose and discuss suicidal ideas Ability to  demonstrate self-control will improve  Medication Management: Evaluate patient's response, side effects, and tolerance of medication regimen.  Therapeutic Interventions: 1 to 1 sessions, Unit Group sessions and Medication administration.  Evaluation of Outcomes: Progressing  Physician Treatment Plan for Secondary Diagnosis: Principal Problem:   Bipolar I, most recent episode mixed, severe with psychotic behavior (HCC) Active Problems:   PTSD (post-traumatic stress disorder)   Auditory hallucination  Long Term Goal(s): Improvement in symptoms so as ready for discharge   Short Term Goals: Ability to identify and develop effective coping behaviors will improve Ability to maintain clinical measurements within normal limits will improve Compliance with prescribed medications will improve Ability to identify changes in lifestyle to reduce recurrence of condition will improve Ability to verbalize feelings will improve Ability to disclose and discuss suicidal ideas Ability to demonstrate self-control will improve     Medication Management: Evaluate patient's response, side effects, and tolerance of medication regimen.  Therapeutic Interventions: 1 to 1 sessions, Unit Group sessions and Medication administration.  Evaluation of Outcomes: Progressing   RN Treatment Plan for Primary Diagnosis: Bipolar I, most recent episode mixed, severe with psychotic behavior (HCC) Long Term Goal(s): Knowledge of disease and therapeutic regimen to maintain health will improve  Short Term Goals: Ability to remain free from injury will improve, Ability to verbalize frustration and anger appropriately will improve, Ability to demonstrate self-control, Ability to participate in decision making will improve, Ability to verbalize feelings will improve, Ability to disclose and discuss suicidal ideas, Ability to identify and develop effective coping behaviors will improve, and Compliance with prescribed medications  will improve  Medication Management: RN will administer medications as ordered by provider, will assess and evaluate patient's response and provide education to patient for prescribed medication. RN will report any adverse and/or side effects to prescribing provider.  Therapeutic Interventions: 1 on 1 counseling sessions, Psychoeducation, Medication administration, Evaluate responses to treatment, Monitor vital signs and CBGs as ordered, Perform/monitor CIWA, COWS, AIMS and Fall Risk screenings as ordered, Perform wound care treatments as ordered.  Evaluation of Outcomes: Progressing   LCSW Treatment Plan for Primary Diagnosis: Bipolar I, most recent episode mixed, severe with psychotic behavior (HCC) Long Term Goal(s): Safe transition to appropriate next level of care at discharge, Engage patient in therapeutic group addressing interpersonal concerns.  Short Term Goals: Engage patient in aftercare planning with referrals and resources, Increase social support, Increase ability to appropriately verbalize feelings, Increase emotional regulation, Facilitate acceptance of mental health diagnosis and concerns, Facilitate patient progression through stages of change regarding substance use diagnoses and concerns, Identify triggers associated with mental health/substance abuse issues, and Increase skills for wellness and recovery  Therapeutic Interventions: Assess for all discharge needs, 1 to 1 time with Social worker, Explore available resources and support systems, Assess for adequacy in community  support network, Educate family and significant other(s) on suicide prevention, Complete Psychosocial Assessment, Interpersonal group therapy.  Evaluation of Outcomes: Progressing   Progress in Treatment: Attending groups: Yes. Participating in groups: Yes. Taking medication as prescribed: Yes. Toleration medication: Yes. Family/Significant other contact made: No, will contact:  Patient declined   Patient understands diagnosis: Yes. Discussing patient identified problems/goals with staff: Yes. Medical problems stabilized or resolved: Yes. Denies suicidal/homicidal ideation: No. Issues/concerns per patient self-inventory: Yes. Other: none  New problem(s) identified: No, Describe:  none  New Short Term/Long Term Goal(s): Patient to work towards detox, elimination of symptoms of psychosis, medication management for mood stabilization; elimination of SI thoughts; development of comprehensive mental wellness/sobriety plan.  Patient Goals:  Patient states their goal for treatment is to "learn how to focus and concentrate."  Discharge Plan or Barriers: No psychosocial barriers identified at this time, patient to return to place of residence when appropriate for discharge.   Reason for Continuation of Hospitalization: Depression Medication stabilization  Estimated Length of Stay: 1-7 days     Scribe for Treatment Team: Almedia Balls 01/09/2023 2:57 PM

## 2023-01-09 NOTE — Group Note (Signed)
Occupational Therapy Group Note  Group Topic:Coping Skills  Group Date: 01/09/2023 Start Time: 1430 End Time: 1500 Facilitators: Aleira Deiter G, OT   Group Description: Group encouraged increased engagement and participation through discussion and activity focused on "Coping Ahead." Patients were split up into teams and selected a card from a stack of positive coping strategies. Patients were instructed to act out/charade the coping skill for other peers to guess and receive points for their team. Discussion followed with a focus on identifying additional positive coping strategies and patients shared how they were going to cope ahead over the weekend while continuing hospitalization stay.  Therapeutic Goal(s): Identify positive vs negative coping strategies. Identify coping skills to be used during hospitalization vs coping skills outside of hospital/at home Increase participation in therapeutic group environment and promote engagement in treatment   Participation Level: Minimal   Participation Quality: Independent   Behavior: Appropriate   Speech/Thought Process: Relevant   Affect/Mood: Appropriate   Insight: Fair   Judgement: Fair      Modes of Intervention: Education  Patient Response to Interventions:  Attentive   Plan: Continue to engage patient in OT groups 2 - 3x/week.  01/09/2023  Lindsey Prince G Lindsey Prince, OT Lindsey Prince, OT    

## 2023-01-09 NOTE — Progress Notes (Signed)
Adult Psychoeducational Group Note  Date:  01/09/2023 Time:  8:23 PM  Group Topic/Focus:  Wrap-Up Group:   The focus of this group is to help patients review their daily goal of treatment and discuss progress on daily workbooks.  Participation Level:  Active  Participation Quality:  Appropriate  Affect:  Appropriate  Cognitive:  Appropriate  Insight: Appropriate  Engagement in Group:  Engaged  Modes of Intervention:  Discussion  Additional Comments:  Shekera attend wrap up AA group  Charna Busman Long 01/09/2023, 8:23 PM

## 2023-01-09 NOTE — Progress Notes (Signed)
   01/09/23 1211  Psych Admission Type (Psych Patients Only)  Admission Status Involuntary  Psychosocial Assessment  Patient Complaints Anxiety;Depression  Eye Contact Fair  Facial Expression Animated  Affect Appropriate to circumstance  Speech Logical/coherent  Interaction Guarded  Motor Activity Other (Comment) (WDL)  Appearance/Hygiene Unremarkable  Behavior Characteristics Cooperative  Mood Pleasant  Thought Process  Coherency WDL  Content Paranoia  Delusions Paranoid  Perception Hallucinations  Hallucination Auditory  Judgment Poor  Confusion None  Danger to Self  Current suicidal ideation? Denies  Agreement Not to Harm Self Yes  Description of Agreement verbal  Danger to Others  Danger to Others None reported or observed  Danger to Others Abnormal  Harmful Behavior to others No threats or harm toward other people  Destructive Behavior No threats or harm toward property

## 2023-01-09 NOTE — Plan of Care (Signed)
°  Problem: Education: °Goal: Emotional status will improve °Outcome: Progressing °Goal: Mental status will improve °Outcome: Progressing °Goal: Verbalization of understanding the information provided will improve °Outcome: Progressing °  °

## 2023-01-09 NOTE — Progress Notes (Signed)
[facility] MD Progress Note  01/09/2023 6:28 PM Lindsey Prince  MRN:  [id number]  Reason for admission: 37 year old Caucasian female with hx of mental illness & cannabis/tobacco use disorders. Admitted to the [facility] from the [facility] with complaints of worsening auditory hallucinations & positive suicidal ideations without any plans or intents. Patient apparently had arrived at the [facility] yesterday citing that she had come to seek mental health evaluation for her 49 year old daughter. It was after initiation of mental health evaluation that the [facility] providers realized patient needed mental healthy evaluation herself & not her daughter. After evaluation, she was transferred to the [facility] for further psychiatric evaluation/treatments.   24-hour chart Review: Past 24 hours of patient's chart was reviewed.  Patient is compliant with scheduled meds. Required Agitation PRNs: none Per RN notes, no documented behavioral issues and is attending group. Patient slept, 5 hours.    Today's Daily notes: Lindsey Prince is seen and examined in the office on [address].  Her chart was reviewed and findings shared with the treatment team and discussed with the attending psychiatrist.   She presents with irritable mood and anxiety this morning.  Reported being tired and sleeping only 5 hours last night.  Chart review indicates Trazodone & Melatonin being given last night for sleep.  Instruct Shereena to request for agitation/anxiety medication to help her relax at bedtime.  She is visible on the unit and attending group activities and therapeutic milieu.  She reported good appetite and attention to hygiene is fair.  Lindsey Prince currently denies any SIHI, AVH, delusional thoughts or paranoia. She does not appear to be responding to any internal stimuli.  Vital signs reviewed within normal Limits.  Will continue current treatment plan as indicated below.  Principal Problem: Bipolar I, most recent episode mixed, severe with psychotic behavior (HCC)  Diagnosis:  Principal Problem:   Bipolar I, most recent episode mixed, severe with psychotic behavior (HCC) Active Problems:   PTSD (post-traumatic stress disorder)   Auditory hallucination  Total Time spent with patient:  35 minutes  Past Psychiatric History: See H&P.  Past Medical History:  Past Medical History:  Diagnosis Date   Anemia    Herpes     Past Surgical History:  Procedure Laterality Date   NO PAST SURGERIES     Family History:  Family History  Problem Relation Age of Onset   Healthy Neg Hx    Family Psychiatric  History: See H&P.  Social History:  Social History   Substance and Sexual Activity  Alcohol Use No     Social History   Substance and Sexual Activity  Drug Use Yes   Types: Marijuana    Social History   Socioeconomic History   Marital status: Single    Spouse name: Not on file   Number of children: Not on file   Years of education: Not on file   Highest education level: Not on file  Occupational History   Not on file  Tobacco Use   Smoking status: Every Day    Packs/day: 1.5    Types: Cigarettes   Smokeless tobacco: Never  Substance and Sexual Activity   Alcohol use: No   Drug use: Yes    Types: Marijuana   Sexual activity: Not Currently  Other Topics Concern   Not on file  Social History Narrative   Not on file   Social Determinants of Health   Financial Resource Strain: Not on file  Food Insecurity: No Food Insecurity (  01/06/2023)   Hunger Vital Sign    Worried About Running Out of Food in the Last Year: Never true    Ran Out of Food in the Last Year: Never true  Transportation Needs: No Transportation Needs (01/06/2023)   PRAPARE - Administrator, Civil Service (Medical): No    Lack of Transportation (Non-Medical): No  Physical Activity: Not on file  Stress: Not on file  Social Connections: Not on file   Additional Social History:   Sleep: Poor  Appetite:  Good  Current Medications: Current Facility-Administered  Medications  Medication Dose Route Frequency Provider Last Rate Last Admin   alum & mag hydroxide-simeth (MAALOX/MYLANTA) 200-200-20 MG/5ML suspension 30 mL  30 mL Oral Q4H PRN Oneta Rack, NP       benztropine (COGENTIN) tablet 0.5 mg  0.5 mg Oral BID Armandina Stammer I, NP   0.5 mg at 01/09/23 0804   haloperidol (HALDOL) tablet 5 mg  5 mg Oral BID Armandina Stammer I, NP   5 mg at 01/09/23 4098   hydrOXYzine (ATARAX) tablet 25 mg  25 mg Oral Q4H PRN Armandina Stammer I, NP   25 mg at 01/08/23 1142   ibuprofen (ADVIL) tablet 400 mg  400 mg Oral Q6H PRN Armandina Stammer I, NP   400 mg at 01/08/23 1309   LORazepam (ATIVAN) tablet 2 mg  2 mg Oral TID PRN Oneta Rack, NP   2 mg at 01/06/23 2112   Or   LORazepam (ATIVAN) injection 2 mg  2 mg Intramuscular TID PRN Oneta Rack, NP       magnesium hydroxide (MILK OF MAGNESIA) suspension 30 mL  30 mL Oral Daily PRN Oneta Rack, NP       melatonin tablet 5 mg  5 mg Oral QHS Armandina Stammer I, NP   5 mg at 01/08/23 2103   propranolol (INDERAL) tablet 10 mg  10 mg Oral TID Armandina Stammer I, NP   10 mg at 01/09/23 1657   traZODone (DESYREL) tablet 100 mg  100 mg Oral QHS Armandina Stammer I, NP   100 mg at 01/08/23 2103   Lab Results:  Results for orders placed or performed during the hospital encounter of 01/06/23 (from the past 48 hour(s))  Lipid panel     Status: None   Collection Time: 01/08/23  6:40 AM  Result Value Ref Range   Cholesterol 111 0 - 200 mg/dL   Triglycerides 87 <119 mg/dL   HDL 50 >14 mg/dL   Total CHOL/HDL Ratio 2.2 RATIO   VLDL 17 0 - 40 mg/dL   LDL Cholesterol 44 0 - 99 mg/dL    Comment:        Total Cholesterol/HDL:CHD Risk Coronary Heart Disease Risk Table                     Men   Women  1/2 Average Risk   3.4   3.3  Average Risk       5.0   4.4  2 X Average Risk   9.6   7.1  3 X Average Risk  23.4   11.0        Use the calculated Patient Ratio above and the CHD Risk Table to determine the patient's CHD Risk.        ATP III  CLASSIFICATION (LDL):  <100     mg/dL   Optimal  782-956  mg/dL   Near or Above  Optimal  130-159  mg/dL   Borderline  161-096  mg/dL   High  >045     mg/dL   Very High Performed at [facility], [address], [place], [place] [address]   Hemoglobin A1c     Status: None   Collection Time: 01/08/23  6:40 AM  Result Value Ref Range   Hgb A1c MFr Bld 4.9 4.8 - 5.6 %    Comment: (NOTE) Pre diabetes:          5.7%-6.4%  Diabetes:              >6.4%  Glycemic control for   <7.0% adults with diabetes    Mean Plasma Glucose 93.93 mg/dL    Comment: Performed at [facility], [address], [place], [place] [address]   Blood Alcohol level:  Lab Results  Component Value Date   [facility] <10 11/06/2021   ETH <10 01/24/2021   Metabolic Disorder Labs: Lab Results  Component Value Date   HGBA1C 4.9 01/08/2023   MPG 93.93 01/08/2023   MPG 103 11/17/2021   No results found for: "PROLACTIN" Lab Results  Component Value Date   CHOL 111 01/08/2023   TRIG 87 01/08/2023   HDL 50 01/08/2023   CHOLHDL 2.2 01/08/2023   VLDL 17 01/08/2023   LDLCALC 44 01/08/2023   LDLCALC 79 11/17/2021   Physical Findings: AIMS:  , ,  ,  ,    CIWA:    COWS:     Musculoskeletal: Strength & Muscle Tone: within normal limits Gait & Station: normal Patient leans: N/A  Psychiatric Specialty Exam:  Presentation  General Appearance:  Casual; Fairly Groomed  Eye Contact: Fair  Speech: Clear and Coherent  Speech Volume: Decreased  Handedness: Right   Mood and Affect  Mood: Anxious; Depressed  Affect: Congruent   Thought Process  Thought Processes: Coherent  Descriptions of Associations:Intact  Orientation:Full (Time, Place and Person)  Thought Content:Logical  History of Schizophrenia/Schizoaffective disorder:No  Duration of Psychotic Symptoms:N/A  Hallucinations:Hallucinations: None Description of Auditory Hallucinations:  Denies  Ideas of Reference:None  Suicidal Thoughts:Suicidal Thoughts: No  Homicidal Thoughts:Homicidal Thoughts: No   Sensorium  Memory: Immediate Fair; Recent Fair  Judgment: Impaired  Insight: Shallow  Executive Functions  Concentration: Fair  Attention Span: Fair  Recall: Fair  Fund of Knowledge: Fair  Language: Good  Psychomotor Activity  Psychomotor Activity: Psychomotor Activity: Decreased  Assets  Assets: Communication Skills; Desire for Improvement; Resilience; Physical Health  Sleep  Sleep: Sleep: Good Number of Hours of Sleep: 5  Physical Exam: Physical Exam Vitals and nursing note reviewed.  HENT:     Nose: Nose normal.  Eyes:     Pupils: Pupils are equal, round, and reactive to light.  Cardiovascular:     Rate and Rhythm: Normal rate.     Pulses: Normal pulses.  Pulmonary:     Effort: Pulmonary effort is normal.  Abdominal:     Palpations: Abdomen is soft.  Genitourinary:    Comments: Deferred Musculoskeletal:        General: Normal range of motion.     Cervical back: Normal range of motion.  Skin:    General: Skin is warm.  Neurological:     Mental Status: She is alert and oriented to person, place, and time.  Psychiatric:        Behavior: Behavior normal.    Review of Systems  Constitutional:  Negative for chills, diaphoresis and fever.  HENT:  Negative for congestion and sore throat.   Respiratory:  Negative for cough, shortness of breath and wheezing.   Cardiovascular:  Negative for chest pain and palpitations.  Gastrointestinal:  Negative for abdominal pain, constipation, diarrhea, heartburn, nausea and vomiting.  Neurological:  Negative for dizziness, tingling, tremors, sensory change, speech change, focal weakness, seizures, loss of consciousness, weakness and headaches.  Psychiatric/Behavioral:  Positive for depression. Negative for hallucinations (Hx psychosis.), memory loss, substance abuse and suicidal ideas. The  patient is nervous/anxious and has insomnia.    Blood pressure 103/74, pulse 84, temperature 97.6 F (36.4 C), temperature source Oral, resp. rate 18, height 5\' 2"  (1.575 m), weight 60.1 kg, SpO2 99 %. Body mass index is 24.25 kg/m.  Treatment Plan Summary: Daily contact with patient to assess and evaluate symptoms and progress in treatment and Medication management.   Continue inpatient hospitalization.  Will continue today 01/09/2023 plan as below except where it is noted.   Principal/active diagnoses.  Bipolar I, most recent episode mixed, severe with psychotic behavior (HCC).    Associated symptoms.  Auditory hallucinations.  Insomnia.  Anxiety.  Plan:  -Continue haldol 5 mg po bid for mood control.  -Continue benztropine 0.5 mg po bid for eps prevention.  -Continue Hydroxyzine 25 mg po qid prn for anxiety.  -Continue Propranolol 10 mg po bid for ^heart rate/anxiety.  -Continue Trazodone 50 mg po Q hs for insomnia.  -Continue Melatonin 5 mg po Q hs for insomnia.    Agitation/severe anxiety.  -Continue Ativan 2 mg po or IM tid prn.   Other PRNS -Continue Tylenol 650 mg every 6 hours PRN for mild pain -Continue Maalox 30 ml Q 4 hrs PRN for indigestion -Continue MOM 30 ml po Q 6 hrs for constipation.  -Continue Ibuprofen 400 mg po Q 6 hrs prn for pain.   Safety and Monitoring: Voluntary admission to inpatient psychiatric unit for safety, stabilization and treatment Daily contact with patient to assess and evaluate symptoms and progress in treatment Patient's case to be discussed in multi-disciplinary team meeting Observation Level : q15 minute checks Vital signs: q12 hours Precautions: Safety   Discharge Planning: Social work and case management to assist with discharge planning and identification of hospital follow-up needs prior to discharge Estimated LOS: 5-7 days Discharge Concerns: Need to establish a safety plan; Medication compliance and effectiveness Discharge  Goals: Return home with outpatient referrals for mental health follow-up including medication management/psychotherapy  Cecilie Lowers, FNP, pmhmp, fnp-bc 01/09/2023, 6:28 PMPatient ID: [name], female   DOB: 02-28-1986, 37 y.o.   MRN: [id number]

## 2023-01-10 DIAGNOSIS — F3164 Bipolar disorder, current episode mixed, severe, with psychotic features: Principal | ICD-10-CM

## 2023-01-10 MED ORDER — BENZTROPINE MESYLATE 0.5 MG PO TABS: 0.5000 mg | ORAL_TABLET | Freq: Two times a day (BID) | ORAL | Status: DC | PRN

## 2023-01-10 NOTE — Group Note (Signed)
LCSW Group Therapy Note  Group Date: 01/10/2023 Start Time: 1100 End Time: 1150   Type of Therapy and Topic:  Group Therapy - Healthy vs Unhealthy Coping Skills Dealing with Depression   Participation Level:  Minimal   Description of Group The focus of this group was to determine what unhealthy coping techniques typically are used by group members and what healthy coping techniques would be helpful in coping with various problems. Patients were guided in becoming aware of the differences between healthy and unhealthy coping techniques. Patients were asked to identify 2-3 healthy coping skills they would like to learn to use more effectively.  Therapeutic Goals Patients learned that coping is what human beings do all day long to deal with various situations in their lives Patients defined and discussed healthy vs unhealthy coping techniques Patients identified their preferred coping techniques and identified whether these were healthy or unhealthy Patients determined 2-3 healthy coping skills they would like to become more familiar with and use more often. Patients provided support and ideas to each other   Summary of Patient Progress:  During group, patient expressed her safe place when she is escaping depression. Patient left group and did not return.   Therapeutic Modalities Cognitive Behavioral Therapy Motivational Interviewing  Isabella Bowens, Theresia Majors 01/10/2023  3:07 PM

## 2023-01-10 NOTE — Progress Notes (Signed)
   01/10/23 0545  15 Minute Checks  Location Bedroom  Visual Appearance Calm  Behavior Sleeping  Sleep (Behavioral Health Patients Only)  Calculate sleep? (Click Yes once per 24 hr at 0600 safety check) Yes  Documented sleep last 24 hours 7.5

## 2023-01-10 NOTE — Progress Notes (Signed)
   01/10/23 2200  Psych Admission Type (Psych Patients Only)  Admission Status Involuntary  Psychosocial Assessment  Patient Complaints None  Eye Contact Fair  Facial Expression Animated  Affect Appropriate to circumstance  Speech Logical/coherent  Interaction Assertive  Motor Activity Slow  Appearance/Hygiene Unremarkable  Behavior Characteristics Cooperative;Appropriate to situation  Mood Pleasant  Thought Process  Coherency WDL  Content WDL  Delusions None reported or observed  Perception WDL  Hallucination None reported or observed  Judgment Poor  Confusion None  Danger to Self  Current suicidal ideation? Denies  Agreement Not to Harm Self Yes  Description of Agreement verbal  Danger to Others  Danger to Others None reported or observed  Danger to Others Abnormal  Harmful Behavior to others No threats or harm toward other people  Destructive Behavior No threats or harm toward property

## 2023-01-10 NOTE — Progress Notes (Signed)
[facility] MD Progress Note  01/10/2023 6:45 PM Lindsey Prince  MRN:  [id number]  Reason for admission: 37 year old Caucasian female with hx of mental illness & cannabis/tobacco use disorders. Admitted to the [facility] from the [facility] with complaints of worsening auditory hallucinations & positive suicidal ideations without any plans or intents. Patient apparently had arrived at the [facility] yesterday citing that she had come to seek mental health evaluation for her 4 year old daughter. It was after initiation of mental health evaluation that the [facility] providers realized patient needed mental healthy evaluation herself & not her daughter. After evaluation, she was transferred to the [facility] for further psychiatric evaluation/treatments.   24-hour chart Review: Past 24 hours of patient's chart was reviewed.  Patient is compliant with scheduled meds. Required Agitation PRNs: none Per RN notes, no documented behavioral issues and is attending group. Patient slept, 8 hours.    Today's Daily notes: Lindsey Prince is seen and examined in her room on [address].  Her chart was reviewed and findings shared with the treatment team and discussed with the attending psychiatrist.   She presents surprisingly with pleasant mood and congruent affect.  Reports sleeping over 8 hours last night and being restful. She is visible on the unit and attending group activities and therapeutic milieu.  Report some coping skills from therapeutic milieu and I did the 2 things she learned: Letting negative comments rolled off her back, and not letting little things get to upset her.  Her insight is improving with fair judgment.  She reported good appetite and attention to hygiene is fair.  The report anxiety of #4/10 and depression of #4/10, with 10 being highest severity.  This is great improvement from yesterday.  No EPS symptoms, Cogentin 0.5 mg p.o. twice daily changed to Cogentin 0.5 mg p.o. twice daily as needed.  Lindsey Prince currently denies any SIHI, AVH, delusional  thoughts or paranoia. She does not appear to be responding to any internal stimuli.  Vital signs reviewed within normal Limits.  Will continue current treatment plan with adjustment as indicated above.  Principal Problem: Bipolar I, most recent episode mixed, severe with psychotic behavior (HCC)  Diagnosis: Principal Problem:   Bipolar I, most recent episode mixed, severe with psychotic behavior (HCC) Active Problems:   PTSD (post-traumatic stress disorder)   Auditory hallucination  Total Time spent with patient:  35 minutes  Past Psychiatric History: See H&P.  Past Medical History:  Past Medical History:  Diagnosis Date   Anemia    Herpes     Past Surgical History:  Procedure Laterality Date   NO PAST SURGERIES     Family History:  Family History  Problem Relation Age of Onset   Healthy Neg Hx    Family Psychiatric  History: See H&P.  Social History:  Social History   Substance and Sexual Activity  Alcohol Use No     Social History   Substance and Sexual Activity  Drug Use Yes   Types: Marijuana    Social History   Socioeconomic History   Marital status: Single    Spouse name: Not on file   Number of children: Not on file   Years of education: Not on file   Highest education level: Not on file  Occupational History   Not on file  Tobacco Use   Smoking status: Every Day    Packs/day: 1.5    Types: Cigarettes   Smokeless tobacco: Never  Substance and Sexual Activity   Alcohol  use: No   Drug use: Yes    Types: Marijuana   Sexual activity: Not Currently  Other Topics Concern   Not on file  Social History Narrative   Not on file   Social Determinants of Health   Financial Resource Strain: Not on file  Food Insecurity: No Food Insecurity (01/06/2023)   Hunger Vital Sign    Worried About Running Out of Food in the Last Year: Never true    Ran Out of Food in the Last Year: Never true  Transportation Needs: No Transportation Needs (01/06/2023)   PRAPARE  - Administrator, Civil Service (Medical): No    Lack of Transportation (Non-Medical): No  Physical Activity: Not on file  Stress: Not on file  Social Connections: Not on file   Additional Social History:   Sleep: Poor  Appetite:  Good  Current Medications: Current Facility-Administered Medications  Medication Dose Route Frequency Provider Last Rate Last Admin   alum & mag hydroxide-simeth (MAALOX/MYLANTA) 200-200-20 MG/5ML suspension 30 mL  30 mL Oral Q4H PRN Oneta Rack, NP       benztropine (COGENTIN) tablet 0.5 mg  0.5 mg Oral BID Armandina Stammer I, NP   0.5 mg at 01/10/23 1610   haloperidol (HALDOL) tablet 5 mg  5 mg Oral BID Armandina Stammer I, NP   5 mg at 01/10/23 9604   hydrOXYzine (ATARAX) tablet 25 mg  25 mg Oral Q4H PRN Armandina Stammer I, NP   25 mg at 01/10/23 5409   ibuprofen (ADVIL) tablet 400 mg  400 mg Oral Q6H PRN Armandina Stammer I, NP   400 mg at 01/08/23 1309   LORazepam (ATIVAN) tablet 2 mg  2 mg Oral TID PRN Oneta Rack, NP   2 mg at 01/06/23 2112   Or   LORazepam (ATIVAN) injection 2 mg  2 mg Intramuscular TID PRN Oneta Rack, NP       magnesium hydroxide (MILK OF MAGNESIA) suspension 30 mL  30 mL Oral Daily PRN Oneta Rack, NP       melatonin tablet 5 mg  5 mg Oral QHS Nwoko, Agnes I, NP   5 mg at 01/09/23 2113   propranolol (INDERAL) tablet 10 mg  10 mg Oral TID Armandina Stammer I, NP   10 mg at 01/10/23 1637   traZODone (DESYREL) tablet 100 mg  100 mg Oral QHS Armandina Stammer I, NP   100 mg at 01/09/23 2113   Lab Results:  No results found for this or any previous visit (from the past 48 hour(s)).  Blood Alcohol level:  Lab Results  Component Value Date   ETH <10 11/06/2021   ETH <10 01/24/2021   Metabolic Disorder Labs: Lab Results  Component Value Date   HGBA1C 4.9 01/08/2023   MPG 93.93 01/08/2023   MPG 103 11/17/2021   No results found for: "PROLACTIN" Lab Results  Component Value Date   CHOL 111 01/08/2023   TRIG 87 01/08/2023    HDL 50 01/08/2023   CHOLHDL 2.2 01/08/2023   VLDL 17 01/08/2023   LDLCALC 44 01/08/2023   LDLCALC 79 11/17/2021   Physical Findings: AIMS: Facial and Oral Movements Muscles of Facial Expression: None, normal Lips and Perioral Area: None, normal Jaw: None, normal Tongue: None, normal,Extremity Movements Upper (arms, wrists, hands, fingers): None, normal Lower (legs, knees, ankles, toes): None, normal, Trunk Movements Neck, shoulders, hips: None, normal, Overall Severity Severity of abnormal movements (highest score from questions above):  None, normal Incapacitation due to abnormal movements: None, normal Patient's awareness of abnormal movements (rate only patient's report): No Awareness, Dental Status Current problems with teeth and/or dentures?: No Does patient usually wear dentures?: No  CIWA:    COWS:     Musculoskeletal: Strength & Muscle Tone: within normal limits Gait & Station: normal Patient leans: N/A  Psychiatric Specialty Exam:  Presentation  General Appearance:  Appropriate for Environment; Casual; Fairly Groomed  Eye Contact: Good  Speech: Normal Rate; Clear and Coherent  Speech Volume: Normal  Handedness: Right  Mood and Affect  Mood: Anxious; Depressed  Affect: Congruent  Thought Process  Thought Processes: Coherent; Goal Directed  Descriptions of Associations:Intact  Orientation:Full (Time, Place and Person)  Thought Content:Logical  History of Schizophrenia/Schizoaffective disorder:No  Duration of Psychotic Symptoms:N/A  Hallucinations:Hallucinations: None Description of Auditory Hallucinations: Denies  Ideas of Reference:None  Suicidal Thoughts:Suicidal Thoughts: No  Homicidal Thoughts:Homicidal Thoughts: No   Sensorium  Memory: Immediate Fair; Recent Fair  Judgment: Fair  Insight: Fair  Art therapist  Concentration: Good  Attention Span: Good  Recall: Fair  Fund of  Knowledge: Fair  Language: Good  Psychomotor Activity  Psychomotor Activity: Psychomotor Activity: Normal  Assets  Assets: Communication Skills; Desire for Improvement; Physical Health; Resilience  Sleep  Sleep: Sleep: Good Number of Hours of Sleep: 8  Physical Exam: Physical Exam Vitals and nursing note reviewed.  HENT:     Nose: Nose normal.  Eyes:     Pupils: Pupils are equal, round, and reactive to light.  Cardiovascular:     Rate and Rhythm: Normal rate.     Pulses: Normal pulses.  Pulmonary:     Effort: Pulmonary effort is normal.  Abdominal:     Palpations: Abdomen is soft.  Genitourinary:    Comments: Deferred Musculoskeletal:        General: Normal range of motion.     Cervical back: Normal range of motion.  Skin:    General: Skin is warm.  Neurological:     Mental Status: She is alert and oriented to person, place, and time.  Psychiatric:        Mood and Affect: Mood normal.        Behavior: Behavior normal.    Review of Systems  Constitutional:  Negative for chills, diaphoresis and fever.  HENT:  Negative for congestion and sore throat.   Respiratory:  Negative for cough, shortness of breath and wheezing.   Cardiovascular:  Negative for chest pain and palpitations.  Gastrointestinal:  Negative for abdominal pain, constipation, diarrhea, heartburn, nausea and vomiting.  Neurological:  Negative for dizziness, tingling, tremors, sensory change, speech change, focal weakness, seizures, loss of consciousness, weakness and headaches.  Psychiatric/Behavioral:  Positive for depression. Negative for hallucinations (Hx psychosis.), memory loss, substance abuse and suicidal ideas. The patient is nervous/anxious and has insomnia.    Blood pressure 109/76, pulse (!) 57, temperature 97.8 F (36.6 C), temperature source Oral, resp. rate 18, height 5\' 2"  (1.575 m), weight 60.1 kg, SpO2 100 %. Body mass index is 24.25 kg/m.  Treatment Plan Summary: Daily contact  with patient to assess and evaluate symptoms and progress in treatment and Medication management.   Continue inpatient hospitalization.  Will continue today 01/10/2023 plan as below except where it is noted.   Principal/active diagnoses.  Bipolar I, most recent episode mixed, severe with psychotic behavior (HCC).    Associated symptoms.  Auditory hallucinations.  Insomnia.  Anxiety.  Plan:  -Continue haldol 5 mg po  bid for mood control.  -Change benztropine 0.5 mg po bid prn for eps prevention 01/10/23 -Continue Hydroxyzine 25 mg po qid prn for anxiety.  -Continue Propranolol 10 mg po bid for ^heart rate/anxiety.  -Continue Trazodone 50 mg po Q hs for insomnia.  -Continue Melatonin 5 mg po Q hs for insomnia.    Agitation/severe anxiety.  -Continue Ativan 2 mg po or IM tid prn.   Other PRNS -Continue Tylenol 650 mg every 6 hours PRN for mild pain -Continue Maalox 30 ml Q 4 hrs PRN for indigestion -Continue MOM 30 ml po Q 6 hrs for constipation.  -Continue Ibuprofen 400 mg po Q 6 hrs prn for pain.   Safety and Monitoring: Voluntary admission to inpatient psychiatric unit for safety, stabilization and treatment Daily contact with patient to assess and evaluate symptoms and progress in treatment Patient's case to be discussed in multi-disciplinary team meeting Observation Level : q15 minute checks Vital signs: q12 hours Precautions: Safety   Discharge Planning: Social work and case management to assist with discharge planning and identification of hospital follow-up needs prior to discharge Estimated LOS: 5-7 days Discharge Concerns: Need to establish a safety plan; Medication compliance and effectiveness Discharge Goals: Return home with outpatient referrals for mental health follow-up including medication management/psychotherapy  Lindsey Lowers, FNP, pmhmp, fnp-bc 01/10/2023, 6:45 PMPatient ID: Lindsey Prince, female   DOB: Jun 02, 1986, 37 y.o.   MRN: [id number] Patient ID: Lindsey Prince, female   DOB: [birth year], 37 y.o.   MRN: [id number]

## 2023-01-10 NOTE — Progress Notes (Signed)
Pt presents with flat affect, depressed mood,  minimal and is irritable on interactions. Denies SI, HI, AVH and pain. However. She endorses anxiety 6/10, depression 3/10 with stressor being "My whole life". PRN Vistaril 25 mg PO given at 0812 with desired effect when reassessed. Support and reassurance offered. Pt educated on current treatment regimen and she verbalized understanding. Vitals rechecked, due to bradycardia. Pt encouraged to increase fluids as she's on scheduled Inderal TID. Safety checks maintained at Q 15 minutes intervals without incident. Attended scheduled groups, participated in activities. Tolerated meals and fluids well. Off unit for meals and recreational activities without issues.

## 2023-01-10 NOTE — Group Note (Signed)
Recreation Therapy Group Note   Group Topic:Animal Assisted Therapy   Group Date: 01/10/2023 Start Time: 0945 End Time: 1035 Facilitators: Lawsyn Heiler-McCall, LRT,CTRS Location: [address] Dayroom   Animal-Assisted Activity (AAA) Program Checklist/Progress Notes Patient Eligibility Criteria Checklist & Daily Group note for Rec Tx Intervention  AAA/T Program Assumption of Risk Form signed by Patient/ or Parent Legal Guardian Yes  Patient is free of allergies or severe asthma Yes  Patient reports no fear of animals Yes  Patient reports no history of cruelty to animals Yes  Patient understands his/her participation is voluntary Yes  Patient washes hands before animal contact Yes  Patient washes hands after animal contact Yes   Affect/Mood: Appropriate   Participation Level: Engaged   Participation Quality: Independent   Behavior: Appropriate   Speech/Thought Process: Focused    Clinical Observations/Individualized Feedback:  Patient attended session and interacted appropriately with therapy dog and peers. Patient asked appropriate questions about therapy dog and his training. Patient shared stories about their pets at home with group.    Plan: Continue to engage patient in RT group sessions 2-3x/week.   Lindsey Prince, LRT,CTRS  01/10/2023 2:04 PM

## 2023-01-10 NOTE — Group Note (Signed)
Date:  01/10/2023 Time:  10:34 AM  Group Topic/Focus:  Goals Group:   The focus of this group is to help patients establish daily goals to achieve during treatment and discuss how the patient can incorporate goal setting into their daily lives to aide in recovery. Orientation:   The focus of this group is to educate the patient on the purpose and policies of crisis stabilization and provide a format to answer questions about their admission.  The group details unit policies and expectations of patients while admitted.    Participation Level:  Active  Participation Quality:  Attentive  Affect:  Appropriate  Cognitive:  Appropriate  Insight: Appropriate  Engagement in Group:  Engaged  Modes of Intervention:  Discussion  Additional Comments:   Patient attended goals group and was attentive the duration of it. Patient's goal was to attend all groups.   Cutler Sunday T Aniayah Alaniz 01/10/2023, 10:34 AM

## 2023-01-11 NOTE — [facility] Group Notes (Signed)
Spiritual care group on grief and loss facilitated by Chaplain Dyanne Carrel, [facility]  Group Goal: Support / Education around grief and loss  Members engage in facilitated group support and psycho-social education.  Group Description:  Following introductions and group rules, group members engaged in facilitated group dialogue and support around topic of loss, with particular support around experiences of loss in their lives. Group Identified types of loss (relationships / self / things) and identified patterns, circumstances, and changes that precipitate losses. Reflected on thoughts / feelings around loss, normalized grief responses, and recognized variety in grief experience. Group encouraged individual reflection on safe space and on the coping skills that they are already utilizing.  Group drew on Adlerian / Rogerian and narrative framework  Patient Progress: Lindsey Prince was present for the first part of group, but did not participate.  She left partway through group.

## 2023-01-11 NOTE — Progress Notes (Signed)
[facility] MD Progress Note  01/11/2023 3:23 PM Lindsey Prince  MRN:  [id number]  Reason for admission: 37 year old Caucasian female with hx of mental illness & cannabis/tobacco use disorders. Admitted to the [facility] from the [facility] with complaints of worsening auditory hallucinations & positive suicidal ideations without any plans or intents. Patient apparently had arrived at the [facility] yesterday citing that she had come to seek mental health evaluation for her 52 year old daughter. It was after initiation of mental health evaluation that the [facility] providers realized patient needed mental healthy evaluation herself & not her daughter. After evaluation, she was transferred to the [facility] for further psychiatric evaluation/treatments.   24-hour chart Review: Past 24 hours of patient's chart was reviewed.  Patient is compliant with scheduled meds. Has not been experiencing any agitations. Per RN notes, no documented behavioral issues and is attending group. Patient is sleeping well.    Daily notes: [name] is seen in her room. Chart reviewed. The chart findings discussed with the treatment team. She presents alert, oriented & aware of situation. She is visible on the unit, attending group sessions. She presents with a cheerful affect, good eye contact & verbally responsive. She reports, "I'm really doing well. I feel hopeful now. I'm doing well on my medicines. I have no side effects to report. I am sleeping very well at night. My appetite is good. I have been attending group sessions, although I am not a social kind of a person. I have learned a lot of coping skills, one of them being, I don't have to be perfect. I think I'm feeling good enough to feel like I'm ready for discharge anytime now". Liyat currently denies any SIHI, AVH, delusional thoughts or paranoia. She does not appear to be responding to any internal stimuli. There are currently no changes made on the current plan of care. Will continue as already in progress.  Reviewed labs, there are currently no recent lab results. Vital signs reviewed. Pulse rate is slightly low (58). Patient is currently in no apparent distress.  Principal Problem: Bipolar I, most recent episode mixed, severe with psychotic behavior (HCC)  Diagnosis: Principal Problem:   Bipolar I, most recent episode mixed, severe with psychotic behavior (HCC) Active Problems:   PTSD (post-traumatic stress disorder)   Auditory hallucination  Total Time spent with patient:  35 minutes  Past Psychiatric History: See H&P.  Past Medical History:  Past Medical History:  Diagnosis Date   Anemia    Herpes     Past Surgical History:  Procedure Laterality Date   NO PAST SURGERIES     Family History:  Family History  Problem Relation Age of Onset   Healthy Neg Hx    Family Psychiatric  History: See H&P.  Social History:  Social History   Substance and Sexual Activity  Alcohol Use No     Social History   Substance and Sexual Activity  Drug Use Yes   Types: Marijuana    Social History   Socioeconomic History   Marital status: Single    Spouse name: Not on file   Number of children: Not on file   Years of education: Not on file   Highest education level: Not on file  Occupational History   Not on file  Tobacco Use   Smoking status: Every Day    Packs/day: 1.5    Types: Cigarettes   Smokeless tobacco: Never  Substance and Sexual Activity   Alcohol use: No  Drug use: Yes    Types: Marijuana   Sexual activity: Not Currently  Other Topics Concern   Not on file  Social History Narrative   Not on file   Social Determinants of Health   Financial Resource Strain: Not on file  Food Insecurity: No Food Insecurity (01/06/2023)   Hunger Vital Sign    Worried About Running Out of Food in the Last Year: Never true    Ran Out of Food in the Last Year: Never true  Transportation Needs: No Transportation Needs (01/06/2023)   PRAPARE - Scientist, research (physical sciences) (Medical): No    Lack of Transportation (Non-Medical): No  Physical Activity: Not on file  Stress: Not on file  Social Connections: Not on file   Additional Social History:   Sleep: Poor  Appetite:  Good  Current Medications: Current Facility-Administered Medications  Medication Dose Route Frequency Provider Last Rate Last Admin   alum & mag hydroxide-simeth (MAALOX/MYLANTA) 200-200-20 MG/5ML suspension 30 mL  30 mL Oral Q4H PRN Oneta Rack, NP       benztropine (COGENTIN) tablet 0.5 mg  0.5 mg Oral BID PRN Ntuen, Jesusita Oka, FNP       haloperidol (HALDOL) tablet 5 mg  5 mg Oral BID Armandina Stammer I, NP   5 mg at 01/11/23 0747   hydrOXYzine (ATARAX) tablet 25 mg  25 mg Oral Q4H PRN Armandina Stammer I, NP   25 mg at 01/10/23 8295   ibuprofen (ADVIL) tablet 400 mg  400 mg Oral Q6H PRN Armandina Stammer I, NP   400 mg at 01/08/23 1309   LORazepam (ATIVAN) tablet 2 mg  2 mg Oral TID PRN Oneta Rack, NP   2 mg at 01/06/23 2112   Or   LORazepam (ATIVAN) injection 2 mg  2 mg Intramuscular TID PRN Oneta Rack, NP       magnesium hydroxide (MILK OF MAGNESIA) suspension 30 mL  30 mL Oral Daily PRN Oneta Rack, NP       melatonin tablet 5 mg  5 mg Oral QHS Maeghan Canny I, NP   5 mg at 01/10/23 2045   propranolol (INDERAL) tablet 10 mg  10 mg Oral TID Armandina Stammer I, NP   10 mg at 01/11/23 1204   traZODone (DESYREL) tablet 100 mg  100 mg Oral QHS Armandina Stammer I, NP   100 mg at 01/10/23 2045   Lab Results:  No results found for this or any previous visit (from the past 48 hour(s)).  Blood Alcohol level:  Lab Results  Component Value Date   ETH <10 11/06/2021   ETH <10 01/24/2021   Metabolic Disorder Labs: Lab Results  Component Value Date   HGBA1C 4.9 01/08/2023   MPG 93.93 01/08/2023   MPG 103 11/17/2021   No results found for: "PROLACTIN" Lab Results  Component Value Date   CHOL 111 01/08/2023   TRIG 87 01/08/2023   HDL 50 01/08/2023   CHOLHDL 2.2 01/08/2023    VLDL 17 01/08/2023   LDLCALC 44 01/08/2023   LDLCALC 79 11/17/2021   Physical Findings: AIMS: Facial and Oral Movements Muscles of Facial Expression: None, normal Lips and Perioral Area: None, normal Jaw: None, normal Tongue: None, normal,Extremity Movements Upper (arms, wrists, hands, fingers): None, normal Lower (legs, knees, ankles, toes): None, normal, Trunk Movements Neck, shoulders, hips: None, normal, Overall Severity Severity of abnormal movements (highest score from questions above): None, normal Incapacitation due to abnormal  movements: None, normal Patient's awareness of abnormal movements (rate only patient's report): No Awareness, Dental Status Current problems with teeth and/or dentures?: No Does patient usually wear dentures?: No  CIWA:    COWS:     Musculoskeletal: Strength & Muscle Tone: within normal limits Gait & Station: normal Patient leans: N/A  Psychiatric Specialty Exam:  Presentation  General Appearance:  Appropriate for Environment; Casual; Fairly Groomed  Eye Contact: Good  Speech: Normal Rate; Clear and Coherent  Speech Volume: Normal  Handedness: Right  Mood and Affect  Mood: Anxious; Depressed  Affect: Congruent  Thought Process  Thought Processes: Coherent; Goal Directed  Descriptions of Associations:Intact  Orientation:Full (Time, Place and Person)  Thought Content:Logical  History of Schizophrenia/Schizoaffective disorder:No  Duration of Psychotic Symptoms:N/A  Hallucinations:Hallucinations: None Description of Auditory Hallucinations: Denies  Ideas of Reference:None  Suicidal Thoughts:Suicidal Thoughts: No  Homicidal Thoughts:Homicidal Thoughts: No   Sensorium  Memory: Immediate Fair; Recent Fair  Judgment: Fair  Insight: Fair  Art therapist  Concentration: Good  Attention Span: Good  Recall: Fair  Fund of Knowledge: Fair  Language: Good  Psychomotor Activity  Psychomotor  Activity: Psychomotor Activity: Normal  Assets  Assets: Communication Skills; Desire for Improvement; Physical Health; Resilience  Sleep  Sleep: Sleep: Good Number of Hours of Sleep: 8  Physical Exam: Physical Exam Vitals and nursing note reviewed.  HENT:     Nose: Nose normal.  Eyes:     Pupils: Pupils are equal, round, and reactive to light.  Cardiovascular:     Rate and Rhythm: Normal rate.     Pulses: Normal pulses.  Pulmonary:     Effort: Pulmonary effort is normal.  Abdominal:     Palpations: Abdomen is soft.  Genitourinary:    Comments: Deferred Musculoskeletal:        General: Normal range of motion.     Cervical back: Normal range of motion.  Skin:    General: Skin is warm.  Neurological:     Mental Status: She is alert and oriented to person, place, and time.  Psychiatric:        Mood and Affect: Mood normal.        Behavior: Behavior normal.   Review of Systems  Constitutional:  Negative for chills, diaphoresis and fever.  HENT:  Negative for congestion and sore throat.   Respiratory:  Negative for cough, shortness of breath and wheezing.   Cardiovascular:  Negative for chest pain and palpitations.  Gastrointestinal:  Negative for abdominal pain, constipation, diarrhea, heartburn, nausea and vomiting.  Neurological:  Negative for dizziness, tingling, tremors, sensory change, speech change, focal weakness, seizures, loss of consciousness, weakness and headaches.  Psychiatric/Behavioral:  Positive for depression ((iImproving).). Negative for hallucinations (Hx psychosis (stable).), memory loss, substance abuse and suicidal ideas. The patient is not nervous/anxious and does not have insomnia.    Blood pressure 118/85, pulse (!) 58, temperature 98.2 F (36.8 C), temperature source Oral, resp. rate 18, height 5\' 2"  (1.575 m), weight 60.1 kg, SpO2 99 %. Body mass index is 24.25 kg/m.  Treatment Plan Summary: Daily contact with patient to assess and evaluate  symptoms and progress in treatment and Medication management.   Continue inpatient hospitalization.  Will continue today 01/11/2023 plan as below except where it is noted.   Principal/active diagnoses.  Bipolar I, most recent episode mixed, severe with psychotic behavior (HCC).    Associated symptoms.  Auditory hallucinations.  Insomnia.  Anxiety.  Plan:  -Continue haldol 5 mg po bid for  mood control.  -Continue benztropine 0.5 mg po bid prn for eps prevention (01/10/23). -Continue Hydroxyzine 25 mg po qid prn for anxiety.  -Continue Propranolol 10 mg po bid for ^heart rate/anxiety.  -Continue Trazodone 50 mg po Q hs for insomnia.  -Continue Melatonin 5 mg po Q hs for insomnia.    Agitation/severe anxiety.  -Continue Ativan 2 mg po or IM tid prn.   Other PRNS -Continue Tylenol 650 mg every 6 hours PRN for mild pain -Continue Maalox 30 ml Q 4 hrs PRN for indigestion -Continue MOM 30 ml po Q 6 hrs for constipation.  -Continue Ibuprofen 400 mg po Q 6 hrs prn for pain.   Safety and Monitoring: Voluntary admission to inpatient psychiatric unit for safety, stabilization and treatment Daily contact with patient to assess and evaluate symptoms and progress in treatment Patient's case to be discussed in multi-disciplinary team meeting Observation Level : q15 minute checks Vital signs: q12 hours Precautions: Safety   Discharge Planning: Social work and case management to assist with discharge planning and identification of hospital follow-up needs prior to discharge Estimated LOS: 5-7 days Discharge Concerns: Need to establish a safety plan; Medication compliance and effectiveness Discharge Goals: Return home with outpatient referrals for mental health follow-up including medication management/psychotherapy  Armandina Stammer, NP, pmhmp, fnp-bc 01/11/2023, 3:23 PMPatient ID: Lindsey Prince, female   DOB: 1985/11/29, 37 y.o.   MRN: [id number] Patient ID: KENJI BIDDULPH, female   DOB: [birth year],  37 y.o.   MRN: [id number] Patient ID: HIXIE GLAUNER, female   DOB: [birth year], 37 y.o.   MRN: [id number]

## 2023-01-11 NOTE — [facility] Group Notes (Signed)
[facility] Group Notes:  (Nursing/MHT/Case Management/Adjunct)  Date:  01/11/2023  Time:  1:01 AM  Type of Therapy:   Wrap-up group  Participation Level:  Active  Participation Quality:  Appropriate  Affect:  Appropriate  Cognitive:  Appropriate  Insight:  Appropriate  Engagement in Group:  Engaged  Modes of Intervention:  Education  Summary of Progress/Problems: Pt goal to work on communication, day was 7/10.  Noah Delaine 01/11/2023, 1:01 AM

## 2023-01-11 NOTE — Group Note (Signed)
Recreation Therapy Group Note   Group Topic:Team Building  Group Date: 01/11/2023 Start Time: 0934 End Time: 1010 Facilitators: Oluwatosin Bracy-McCall, LRT,CTRS Location: [address] Dayroom   Goal Area(s) Addresses:  Patient will effectively work with peer towards shared goal.  Patient will identify skills used to make activity successful.  Patient will identify how skills used during activity can be used to reach post d/c goals.   Group Description: Landing Pad. In teams of 3-5, patients were given 12 plastic drinking straws and an equal length of masking tape. Using the materials provided, patients were asked to build a landing pad to catch a golf ball dropped from approximately 5 feet in the air. All materials were required to be used by the team in their design. LRT facilitated post-activity discussion.   Affect/Mood: Appropriate   Participation Level: Engaged   Participation Quality: Independent   Behavior: Appropriate   Speech/Thought Process: Focused   Insight: Good   Judgement: Good   Modes of Intervention: STEM Activity   Patient Response to Interventions:  Engaged   Education Outcome:  Acknowledges education   Clinical Observations/Individualized Feedback: Pt attended and participated in group session.    Plan: Continue to engage patient in RT group sessions 2-3x/week.   Lindsey Prince, LRT,CTRS 01/11/2023 1:11 PM

## 2023-01-11 NOTE — Progress Notes (Signed)
D:  Patient's self inventory sheet, patient sleeps good,.  Good appetite, normal energy level, good concentration.  Rated depression and hopeless 1, anxiety 2.  Denied withdrawals.  Denied SI.  Denied physical problems.  Denied physical pain.  Goal is concentration.  Plans to keep practicing and not giving into temptation.  No discharge plans. A:  Medications administered per MD orders.  Emotional support and encouragement given. R:  Denied SI and HI, contracts for safety.  Denied A/V hallucinations.  Safety maintained with 15 minute checks.

## 2023-01-11 NOTE — Plan of Care (Signed)
Nurse discussed anxiety, depression and coping skills with patient.  

## 2023-01-11 NOTE — Progress Notes (Signed)
Adult Psychoeducational Group Note  Date:  01/11/2023 Time:  8:28 PM  Group Topic/Focus:  Wrap-Up Group:   The focus of this group is to help patients review their daily goal of treatment and discuss progress on daily workbooks.  Participation Level:  Active  Participation Quality:  Appropriate  Affect:  Appropriate  Cognitive:  Appropriate  Insight: Appropriate  Engagement in Group:  Engaged  Modes of Intervention:  Discussion  Additional Comments:  Ricketta attend wrap up NA group  Charna Busman Long 01/11/2023, 8:28 PM

## 2023-01-11 NOTE — Group Note (Signed)
Date:  01/11/2023 Time:  10:30 AM  Group Topic/Focus:  Goals Group:   The focus of this group is to help patients establish daily goals to achieve during treatment and discuss how the patient can incorporate goal setting into their daily lives to aide in recovery.    Participation Level:  None  Participation Quality:  Attentive  Affect:  Blunted  Cognitive:  Appropriate  Insight: Good  Engagement in Group:  Limited  Modes of Intervention:  Discussion  Additional Comments:     Reymundo Poll 01/11/2023, 10:30 AM

## 2023-01-12 MED ORDER — PROPRANOLOL HCL 10 MG PO TABS: 10.0000 mg | ORAL_TABLET | Freq: Three times a day (TID) | ORAL | 0 refills | Status: AC

## 2023-01-12 MED ORDER — HALOPERIDOL DECANOATE 100 MG/ML IM SOLN: 50.0000 mg | INTRAMUSCULAR | 0 refills | Status: AC

## 2023-01-12 MED ORDER — TRAZODONE HCL 100 MG PO TABS: 100.0000 mg | ORAL_TABLET | Freq: Every day | ORAL | 0 refills | Status: AC

## 2023-01-12 MED ORDER — HALOPERIDOL DECANOATE 100 MG/ML IM SOLN
50.0000 mg | INTRAMUSCULAR | Status: DC
  Administered 2023-01-12: 50 mg via INTRAMUSCULAR
  Filled 2023-01-12: qty 0.5

## 2023-01-12 MED ORDER — HALOPERIDOL 5 MG PO TABS: 5.0000 mg | ORAL_TABLET | Freq: Two times a day (BID) | ORAL | 0 refills | Status: AC

## 2023-01-12 MED ORDER — HYDROXYZINE HCL 25 MG PO TABS: 25.0000 mg | ORAL_TABLET | ORAL | 0 refills | Status: AC | PRN

## 2023-01-12 NOTE — Discharge Instructions (Addendum)
-  Follow-up with your outpatient psychiatric provider -instructions on appointment date, time, and address (location) are provided to you in discharge paperwork.  -Take your psychiatric medications as prescribed at discharge - instructions are provided to you in the discharge paperwork You received haldol dec LAI 50 mg on 01-12-23. Your next dose of haldol dec 50 mg is due on 02-09-23.  Continue oral haldol for at least 3 weeks, and your outpatient psychiatrist can taper you off of the oral haldol as you transition to the LAI only.   -Follow-up with outpatient primary care doctor and other specialists -for management of preventative medicine and any chronic medical disease.  -Recommend abstinence from alcohol, tobacco, and other illicit drug use at discharge.   -If your psychiatric symptoms recur, worsen, or if you have side effects to your psychiatric medications, call your outpatient psychiatric provider, 911, [phone] or go to the nearest emergency department.  -If suicidal thoughts occur, call your outpatient psychiatric provider, 911, [phone] or go to the nearest emergency department.  Naloxone (Narcan) can help reverse an overdose when given to the victim quickly.  [facility] offers free naloxone kits and instructions/training on its use.  Add naloxone to your first aid kit and you can help save a life.   Pick up your free kit at the following locations:   [place]:  [facility], [address] [place] [address] [phone]) [facility] [address] [place] [address] [phone]) [facility] [address] [place] [address]  [place]: [facility] [address] [place] [address] ([phone]) [facility] [address] [place] [address] [phone])

## 2023-01-12 NOTE — Plan of Care (Signed)
  Problem: Education: Goal: Emotional status will improve Outcome: Progressing   Problem: Education: Goal: Ability to state activities that reduce stress will improve Outcome: Progressing   Problem: Self-Concept: Goal: Level of anxiety will decrease Outcome: Progressing

## 2023-01-12 NOTE — Group Note (Signed)
Date:  01/12/2023 Time:  11:11 AM  Group Topic/Focus:  Goals Group:   The focus of this group is to help patients establish daily goals to achieve during treatment and discuss how the patient can incorporate goal setting into their daily lives to aide in recovery.    Participation Level:  Active  Participation Quality:  Appropriate  Affect:  Appropriate  Cognitive:  Appropriate  Insight: Appropriate  Engagement in Group:  Engaged  Modes of Intervention:  Education  Additional Comments:     Reymundo Poll 01/12/2023, 11:11 AM

## 2023-01-12 NOTE — Discharge Summary (Signed)
Physician Discharge Summary Note  Patient:  Lindsey Prince is an 37 y.o., female MRN:  [id number] DOB:  1986/04/07 Patient phone:  [phone] (home)  Patient address:   [address] [place] [address],   Total Time spent with patient:  Greater than 30 minutes  Date of Admission:  01/06/2023  Date of Discharge: 01-12-23  Reason for Admission: Worsening psychosis & suicidal ideations.  Principal Problem: Bipolar I, most recent episode mixed, severe with psychotic behavior [facility]) Discharge Diagnoses: Principal Problem:   Bipolar I, most recent episode mixed, severe with psychotic behavior (HCC) Active Problems:   PTSD (post-traumatic stress disorder)   Auditory hallucination  Past Psychiatric History: Bipolar 1 disorder, PTSD.  Past Medical History:  Past Medical History:  Diagnosis Date   Anemia    Herpes     Past Surgical History:  Procedure Laterality Date   NO PAST SURGERIES     Family History:  Family History  Problem Relation Age of Onset   Healthy Neg Hx    Family Psychiatric  History: See H&P.  Social History:  Social History   Substance and Sexual Activity  Alcohol Use No     Social History   Substance and Sexual Activity  Drug Use Yes   Types: Marijuana    Social History   Socioeconomic History   Marital status: Single    Spouse name: Not on file   Number of children: Not on file   Years of education: Not on file   Highest education level: Not on file  Occupational History   Not on file  Tobacco Use   Smoking status: Every Day    Packs/day: 1.5    Types: Cigarettes   Smokeless tobacco: Never  Substance and Sexual Activity   Alcohol use: No   Drug use: Yes    Types: Marijuana   Sexual activity: Not Currently  Other Topics Concern   Not on file  Social History Narrative   Not on file   Social Determinants of Health   Financial Resource Strain: Not on file  Food Insecurity: No Food Insecurity (01/06/2023)   Hunger Vital Sign     Worried About Running Out of Food in the Last Year: Never true    Ran Out of Food in the Last Year: Never true  Transportation Needs: No Transportation Needs (01/06/2023)   PRAPARE - Administrator, Civil Service (Medical): No    Lack of Transportation (Non-Medical): No  Physical Activity: Not on file  Stress: Not on file  Social Connections: Not on file   Hospital Course: (Per admission evaluation notes):  37 year old Caucasian female with hx of mental illness & cannabis/tobacco use disorders. Admitted to the [facility] from the [facility] with complaints of worsening auditory hallucinations & positive suicidal ideations without any plans or intents. Patient apparently had arrived at the [facility] yesterday citing that she had come to seek mental health evaluation for her 64 year old daughter. It was after initiation of mental health evaluation that the [facility] providers realized patient needed mental healthy evaluation herself & not her daughter. After evaluation, she was transferred to the [facility] for further psychiatric evaluation/treatments. Lindsey Prince was last hospitalized, treated & discharged from this [facility] in March of 2023. She was hospitalized for 17 days.   This is one of several psychiatric admissions/discharge summaries from this [facility] for this 37 year old Caucasian female. She is with hx of chronic mental illness, PTSD, substance use disorder & multiple psychiatric  admissions. Lindsey Prince has been tried on multiple psychotropic medications for her presenting symptoms & it appeared she has not been compliant with her recommended treatment regimen. This time around, she cited that after her last [facility] hospital discharge, she did go for her follow-up appoint at [facility] & to receive her monthly Haldol injection that was due but was told there were no haldol injectable available. She stated that she left [facility], went home & has been without any medications for a year. She was brought to the hospital this time around for evaluation &  treatment for worsening psychosis after she presented to the [facility] seeking mental health resources for herself/child.   After evaluation of her presenting symptoms, [name] was recommended for mood stabilization treatments. The medication regimen for her presenting symptoms were discussed & with her consent initiated. She did ask for the discharge medications from her most recent [facility] hospital stay/discharge to be re-instated as she did very well on those medications at the time. She received, stabilized & was discharged on the medications as listed below on her discharge medication lists. She was also enrolled & participated in the group counseling sessions being offered & held on this unit. She learned coping skills. She presented on this admission, other chronic medical conditions that required treatment & monitoring. She was resumed, treated & discharged on all her pertinent home medications for those pre-existing health issues. She tolerated her treatment regimen without any adverse effects or reactions reported.  Lindsey Prince's symptoms responded well to her treatment regimen warranting this discharge. She is also mentally & physically stable & agreeable to this discharge. During the course of her hospitalization, the 15-minute checks were adequate to ensure Lindsey Prince's safety.  Patient did not display any dangerous, violent or suicidal behavior on the unit. She interacted with patients & staff appropriately, participated appropriately in the group sessions/therapies. Her medications were addressed & adjusted to meet her needs. She was recommended for outpatient follow-up care & medication management upon discharge to assure her continuity of care.  At the time of discharge, patient is not reporting any acute suicidal/homicidal ideations. She feels more confident about her self-care & in managing her symptoms. She currently denies any new issues or concerns. Education and supportive counseling provided throughout her  hospital stay & upon discharge.  Today upon her discharge evaluation with her treatment team, Lindsey Prince shares she is doing well. She denies any other specific concerns. She is sleeping well. Her appetite is good. She denies other physical complaints. She denies AH/VH, delusional thoughts or paranoia. She does not appear to be responding to any internal stimuli. She feels that her medications have been helpful & is in agreement to continue her current treatment regimen as recommended. She was able to engage in safety planning including plan to return to [facility] or contact emergency services if she feels unable to maintain her own safety or the safety of others. Pt had no further questions, comments, or concerns. She left [facility] with all personal belongings in no apparent distress. Transportation per her arrangement.    Physical Findings: AIMS: Facial and Oral Movements Muscles of Facial Expression: None, normal Lips and Perioral Area: None, normal Jaw: None, normal Tongue: None, normal,Extremity Movements Upper (arms, wrists, hands, fingers): None, normal Lower (legs, knees, ankles, toes): None, normal, Trunk Movements Neck, shoulders, hips: None, normal, Overall Severity Severity of abnormal movements (highest score from questions above): None, normal Incapacitation due to abnormal movements: None, normal Patient's awareness of abnormal movements (rate only patient's report): No  Awareness, Dental Status Current problems with teeth and/or dentures?: No Does patient usually wear dentures?: No  CIWA:    COWS:     Musculoskeletal: Strength & Muscle Tone: within normal limits Gait & Station: normal Patient leans: N/A   Psychiatric Specialty Exam:  Presentation  General Appearance:  Appropriate for Environment; Fairly Groomed  Eye Contact: Good  Speech: Clear and Coherent; Normal Rate  Speech Volume: Normal  Handedness: Right   Mood and Affect  Mood: Euthymic  Affect: Congruent;  Appropriate  Thought Process  Thought Processes: Coherent; Goal Directed; Linear  Descriptions of Associations:Intact  Orientation:Full (Time, Place and Person)  Thought Content:Logical  History of Schizophrenia/Schizoaffective disorder:No  Duration of Psychotic Symptoms:N/A  Hallucinations:Hallucinations: None Description of Auditory Hallucinations: NA  Ideas of Reference:None  Suicidal Thoughts:Suicidal Thoughts: No  Homicidal Thoughts:Homicidal Thoughts: No   Sensorium  Memory: Immediate Good; Recent Good; Remote Good  Judgment: Good  Insight: Good  Executive Functions  Concentration: Good  Attention Span: Good  Recall: Good  Fund of Knowledge: Good  Language: Good  Psychomotor Activity  Psychomotor Activity: Psychomotor Activity: Normal  Assets  Assets: Communication Skills; Desire for Improvement; Financial Resources/Insurance; Housing; Social Support; Resilience; Physical Health  Sleep  Sleep: Sleep: Good Number of Hours of Sleep: 7.5  Physical Exam: Physical Exam Vitals and nursing note reviewed.  HENT:     Mouth/Throat:     Pharynx: Oropharynx is clear.  Cardiovascular:     Rate and Rhythm: Normal rate.     Pulses: Normal pulses.  Pulmonary:     Effort: Pulmonary effort is normal.  Genitourinary:    Comments: Deferred Musculoskeletal:        General: Normal range of motion.     Cervical back: Normal range of motion.  Skin:    General: Skin is warm and dry.  Neurological:     General: No focal deficit present.     Mental Status: She is alert and oriented to person, place, and time. Mental status is at baseline.    Review of Systems  Constitutional:  Negative for chills, diaphoresis and fever.  HENT:  Negative for congestion and sore throat.   Respiratory:  Negative for cough, shortness of breath and wheezing.   Cardiovascular:  Negative for chest pain and palpitations.  Gastrointestinal:  Negative for abdominal pain,  constipation, diarrhea, heartburn, nausea and vomiting.  Genitourinary:  Negative for dysuria.  Musculoskeletal:  Negative for joint pain and myalgias.  Neurological:  Negative for dizziness, tingling, tremors, sensory change, speech change, focal weakness, seizures, loss of consciousness, weakness and headaches.  Endo/Heme/Allergies:        Allergies: Percocet  Psychiatric/Behavioral:  Positive for depression (Hx (stable on medication).). Negative for hallucinations (Hx of (stable on medication.), memory loss, substance abuse (Hx THC use.) and suicidal ideas. The patient has insomnia (Hx of (stable on medication.). The patient is not nervous/anxious (Stable upon discharge).    Blood pressure 100/60, pulse 90, temperature 98.1 F (36.7 C), temperature source Oral, resp. rate 18, height 5\' 2"  (1.575 m), weight 60.1 kg, SpO2 99 %. Body mass index is 24.25 kg/m.   Social History   Tobacco Use  Smoking Status Every Day   Packs/day: 1.5   Types: Cigarettes  Smokeless Tobacco Never   Tobacco Cessation:  N/A, patient does not currently use tobacco products  Blood Alcohol level:  Lab Results  Component Value Date   ETH <10 11/06/2021   ETH <10 01/24/2021   Metabolic Disorder Labs:  Lab  Results  Component Value Date   HGBA1C 4.9 01/08/2023   MPG 93.93 01/08/2023   MPG 103 11/17/2021   No results found for: "PROLACTIN" Lab Results  Component Value Date   CHOL 111 01/08/2023   TRIG 87 01/08/2023   HDL 50 01/08/2023   CHOLHDL 2.2 01/08/2023   VLDL 17 01/08/2023   LDLCALC 44 01/08/2023   LDLCALC 79 11/17/2021   See Psychiatric Specialty Exam and Suicide Risk Assessment completed by Attending Physician prior to discharge.  Discharge destination:  Home  Is patient on multiple antipsychotic therapies at discharge:  No   Has Patient had three or more failed trials of antipsychotic monotherapy by history:  No  Recommended Plan for Multiple Antipsychotic Therapies: NA  Discharge  Instructions     Diet - low sodium heart healthy   Complete by: As directed    Increase activity slowly   Complete by: As directed       Allergies as of 01/12/2023       Reactions   Percocet [oxycodone-acetaminophen] Itching, Other (See Comments)   Patient reports that she is allergic to Percocet but not Tylenol and she states she takes Tylenol at home as needed.         Medication List     STOP taking these medications    benztropine 0.5 MG tablet Commonly known as: COGENTIN   divalproex 500 MG 24 hr tablet Commonly known as: DEPAKOTE ER   haloperidol decanoate 50 MG/ML injection Commonly known as: Haldol Decanoate Replaced by: haloperidol decanoate 100 MG/ML injection   sertraline 25 MG tablet Commonly known as: ZOLOFT       TAKE these medications      Indication  haloperidol 5 MG tablet Commonly known as: HALDOL Take 1 tablet (5 mg total) by mouth 2 (two) times daily for 21 days.  Indication: Manic Phase of Manic-Depression   haloperidol decanoate 100 MG/ML injection Commonly known as: HALDOL DECANOATE Inject 0.5 mLs (50 mg total) into the muscle every 28 (twenty-eight) days for 1 dose. Next dose is due on 02-09-23. Start taking on: February 09, 2023 Replaces: haloperidol decanoate 50 MG/ML injection  Indication: Mood control   hydrOXYzine 25 MG tablet Commonly known as: ATARAX Take 1 tablet (25 mg total) by mouth every 4 (four) hours as needed for anxiety (Sleep). What changed:  when to take this reasons to take this  Indication: Feeling Anxious   melatonin 5 MG Tabs Take 1 tablet (5 mg total) by mouth at bedtime.  Indication: Trouble Sleeping   propranolol 10 MG tablet Commonly known as: INDERAL Take 1 tablet (10 mg total) by mouth 3 (three) times daily. What changed: Another medication with the same name was added. Make sure you understand how and when to take each.  Indication: Feeling Anxious   propranolol 10 MG tablet Commonly known as:  INDERAL Take 1 tablet (10 mg total) by mouth 3 (three) times daily. What changed: You were already taking a medication with the same name, and this prescription was added. Make sure you understand how and when to take each.  Indication: Feeling Anxious   traZODone 100 MG tablet Commonly known as: DESYREL Take 1 tablet (100 mg total) by mouth at bedtime.  Indication: Trouble Sleeping        Follow-up Information     [facility] Go on 01/13/2023.   Why: Please go to this provider as a walk-in to complete an assessment @ 8:00 AM to obtain a scheduled  therapy and medication managment appointment. In the system it shows that you do not have insurance, however, they will STILL SEE YOU and get state funding to cover your medication and therapy sevices. Contact information: [address] [place] [address] [phone]                Follow-up recommendations: Activity:  As tolerated Diet: As recommended by your primary care doctor. Keep all scheduled follow-up appointments as recommended.   Comments: Comments: Patient is recommended to follow-up care on an outpatient basis as noted above. Prescriptions sent to pt's pharmacy of choice at discharge.   Patient agreeable to plan.   Given opportunity to ask questions.   Appears to feel comfortable with discharge denies any current suicidal or homicidal thought. Patient is also instructed prior to discharge to: Take all medications as prescribed by his/her mental healthcare provider. Report any adverse effects and or reactions from the medicines to his/her outpatient provider promptly. Patient has been instructed & cautioned: To not engage in alcohol and or illegal drug use while on prescription medicines. In the event of worsening symptoms, patient is instructed to call the crisis hotline, 911 and or go to the nearest ED for appropriate evaluation and treatment of symptoms. To follow-up with his/her primary care  provider for your other medical issues, concerns and or health care needs.  Signed: Armandina Stammer, NP, pmhnp, fnp-bc. 01/12/2023, 5:54 PM

## 2023-01-12 NOTE — Plan of Care (Signed)
Nurse discussed coping skills with patient.  

## 2023-01-12 NOTE — Progress Notes (Signed)
Discharge Note:  Patient discharged home with a friend.  Suicide prevention information given and discussed with patient who stated she understood and had no questions.  Denied SI and HI.  Denied A/V hallucinations.  Patient stated she received all her belongings, clothing, toiletries, misc items, etc.  Patient stated she appreciated all assistance received from [facility] staff.  All required discharge information given.  

## 2023-01-12 NOTE — Progress Notes (Signed)
  [facility] Case Management Discharge Plan :  Will you be returning to the same living situation after discharge:  Yes,  Patient will be returning back to her home  At discharge, do you have transportation home?: Yes; patient called her ride to come at 11AM  Do you have the ability to pay for your medications: No.  Release of information consent forms completed and in the chart;  Patient's signature needed at discharge.  Patient to Follow up at:  Follow-up Information     [facility] Go on 01/13/2023.   Why: Please go to this provider as a walk-in to complete an assessment @ 8:00 AM to obtain a scheduled therapy and medication managment appointment. In the system it shows that you do not have insurance, however, they will STILL SEE YOU and get state funding to cover your medication and therapy sevices. Contact information: [address] [place] [address] [phone]                 Next level of care provider has access to [facility] Link:no  Safety Planning and Suicide Prevention discussed: No.Patient declined      Has patient been referred to the Quitline?: Yes, faxed/e-referral on 01/12/2023  Patient has been referred for addiction treatment: No known substance use disorder.  Isabella Bowens, LCSWA 01/12/2023, 10:16 AM

## 2023-01-12 NOTE — Progress Notes (Signed)
   01/11/23 2100  Psych Admission Type (Psych Patients Only)  Admission Status Involuntary  Psychosocial Assessment  Patient Complaints Anxiety;Depression  Eye Contact Fair  Facial Expression Anxious  Affect Appropriate to circumstance  Speech Logical/coherent  Interaction Assertive  Motor Activity Slow  Appearance/Hygiene Unremarkable  Behavior Characteristics Cooperative  Mood Anxious  Thought Process  Coherency WDL  Content WDL  Delusions None reported or observed  Perception WDL  Hallucination None reported or observed  Judgment Poor  Confusion None  Danger to Self  Current suicidal ideation? Denies  Agreement Not to Harm Self Yes  Description of Agreement Verbal  Danger to Others  Danger to Others None reported or observed  Danger to Others Abnormal  Harmful Behavior to others No threats or harm toward other people  Destructive Behavior No threats or harm toward property

## 2023-01-12 NOTE — Progress Notes (Signed)
   01/12/23 0630  15 Minute Checks  Location Bedroom (vitals)  Visual Appearance Calm  Behavior Composed  Sleep (Behavioral Health Patients Only)  Calculate sleep? (Click Yes once per 24 hr at 0600 safety check) Yes  Documented sleep last 24 hours 6.25
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
## 2023-01-12 NOTE — Progress Notes (Signed)
Patient stated "My biggest fear is being here on Mother's Day."

## 2023-01-12 NOTE — Plan of Care (Addendum)
D:  Patient denied SI and HI, contracts for safety.  Denied A/V hallucinations.  Denied pain. A:  Medications administered per MD orders.  Emotional support and encouragement given patient. R:  Safety maintained with 15 minute checks.
